# Patient Record
Sex: Female | Born: 1950
Health system: Southern US, Community
[De-identification: ages and names within clinical notes are randomized; demographics above are authoritative.]

## PROBLEM LIST (undated history)

## (undated) DIAGNOSIS — I639 Cerebral infarction, unspecified: Secondary | ICD-10-CM

## (undated) DIAGNOSIS — Z8679 Personal history of other diseases of the circulatory system: Secondary | ICD-10-CM

## (undated) DIAGNOSIS — K5792 Diverticulitis of intestine, part unspecified, without perforation or abscess without bleeding: Secondary | ICD-10-CM

## (undated) DIAGNOSIS — Z923 Personal history of irradiation: Secondary | ICD-10-CM

## (undated) DIAGNOSIS — C50919 Malignant neoplasm of unspecified site of unspecified female breast: Secondary | ICD-10-CM

## (undated) DIAGNOSIS — E785 Hyperlipidemia, unspecified: Secondary | ICD-10-CM

## (undated) DIAGNOSIS — H5440 Blindness, one eye, unspecified eye: Secondary | ICD-10-CM

## (undated) DIAGNOSIS — R7302 Impaired glucose tolerance (oral): Secondary | ICD-10-CM

## (undated) DIAGNOSIS — I10 Essential (primary) hypertension: Secondary | ICD-10-CM

## (undated) DIAGNOSIS — Z9221 Personal history of antineoplastic chemotherapy: Secondary | ICD-10-CM

## (undated) DIAGNOSIS — K219 Gastro-esophageal reflux disease without esophagitis: Secondary | ICD-10-CM

## (undated) HISTORY — DX: Gastro-esophageal reflux disease without esophagitis: K21.9

## (undated) HISTORY — DX: Blindness, one eye, unspecified eye: H54.40

## (undated) HISTORY — DX: Hyperlipidemia, unspecified: E78.5

## (undated) HISTORY — DX: Cerebral infarction, unspecified: I63.9

## (undated) HISTORY — DX: Impaired glucose tolerance (oral): R73.02

## (undated) HISTORY — DX: Essential (primary) hypertension: I10

## (undated) HISTORY — DX: Malignant neoplasm of unspecified site of unspecified female breast: C50.919

## (undated) HISTORY — DX: Personal history of other diseases of the circulatory system: Z86.79

---

## 1993-04-23 HISTORY — PX: ABDOMINAL HYSTERECTOMY: SHX81

## 2003-04-24 DIAGNOSIS — I639 Cerebral infarction, unspecified: Secondary | ICD-10-CM

## 2003-04-24 HISTORY — DX: Cerebral infarction, unspecified: I63.9

## 2004-12-14 ENCOUNTER — Ambulatory Visit: Payer: Self-pay | Admitting: Family Medicine

## 2005-01-25 ENCOUNTER — Ambulatory Visit: Payer: Self-pay | Admitting: *Deleted

## 2005-01-26 ENCOUNTER — Ambulatory Visit: Payer: Self-pay | Admitting: Family Medicine

## 2005-02-08 ENCOUNTER — Ambulatory Visit: Payer: Self-pay | Admitting: Internal Medicine

## 2005-02-21 ENCOUNTER — Encounter: Admission: RE | Admit: 2005-02-21 | Discharge: 2005-02-21 | Payer: Self-pay | Admitting: Family Medicine

## 2005-03-09 ENCOUNTER — Encounter: Admission: RE | Admit: 2005-03-09 | Discharge: 2005-03-09 | Payer: Self-pay | Admitting: Family Medicine

## 2005-03-28 ENCOUNTER — Ambulatory Visit: Payer: Self-pay | Admitting: Family Medicine

## 2005-05-03 ENCOUNTER — Ambulatory Visit: Payer: Self-pay | Admitting: Family Medicine

## 2005-07-26 ENCOUNTER — Emergency Department (HOSPITAL_COMMUNITY): Admission: EM | Admit: 2005-07-26 | Discharge: 2005-07-27 | Payer: Self-pay | Admitting: Emergency Medicine

## 2005-07-29 ENCOUNTER — Emergency Department (HOSPITAL_COMMUNITY): Admission: EM | Admit: 2005-07-29 | Discharge: 2005-07-29 | Payer: Self-pay | Admitting: Emergency Medicine

## 2005-08-02 ENCOUNTER — Ambulatory Visit: Payer: Self-pay | Admitting: Family Medicine

## 2005-08-03 ENCOUNTER — Ambulatory Visit: Payer: Self-pay | Admitting: Family Medicine

## 2005-08-23 ENCOUNTER — Ambulatory Visit: Payer: Self-pay | Admitting: Family Medicine

## 2005-09-28 ENCOUNTER — Ambulatory Visit: Payer: Self-pay | Admitting: Family Medicine

## 2006-02-07 ENCOUNTER — Ambulatory Visit: Payer: Self-pay | Admitting: Family Medicine

## 2006-05-28 ENCOUNTER — Ambulatory Visit: Payer: Self-pay | Admitting: Family Medicine

## 2006-06-05 ENCOUNTER — Encounter: Admission: RE | Admit: 2006-06-05 | Discharge: 2006-06-05 | Payer: Self-pay | Admitting: Family Medicine

## 2006-06-10 ENCOUNTER — Encounter (INDEPENDENT_AMBULATORY_CARE_PROVIDER_SITE_OTHER): Payer: Self-pay | Admitting: *Deleted

## 2006-06-10 ENCOUNTER — Encounter (INDEPENDENT_AMBULATORY_CARE_PROVIDER_SITE_OTHER): Payer: Self-pay | Admitting: Diagnostic Radiology

## 2006-06-10 ENCOUNTER — Encounter: Admission: RE | Admit: 2006-06-10 | Discharge: 2006-06-10 | Payer: Self-pay | Admitting: Family Medicine

## 2006-06-10 HISTORY — PX: BREAST BIOPSY: SHX20

## 2006-06-12 ENCOUNTER — Ambulatory Visit: Payer: Self-pay | Admitting: Internal Medicine

## 2006-06-18 ENCOUNTER — Encounter: Admission: RE | Admit: 2006-06-18 | Discharge: 2006-06-18 | Payer: Self-pay | Admitting: Family Medicine

## 2006-06-20 ENCOUNTER — Ambulatory Visit: Payer: Self-pay | Admitting: Oncology

## 2006-06-26 LAB — CBC WITH DIFFERENTIAL/PLATELET
BASO%: 0.3 % (ref 0.0–2.0)
Basophils Absolute: 0 10*3/uL (ref 0.0–0.1)
EOS%: 3.4 % (ref 0.0–7.0)
Eosinophils Absolute: 0.3 10*3/uL (ref 0.0–0.5)
HCT: 42 % (ref 34.8–46.6)
HGB: 14 g/dL (ref 11.6–15.9)
LYMPH%: 28.5 % (ref 14.0–48.0)
MCH: 29.3 pg (ref 26.0–34.0)
MCHC: 33.3 g/dL (ref 32.0–36.0)
MCV: 88.2 fL (ref 81.0–101.0)
MONO#: 0.7 10*3/uL (ref 0.1–0.9)
MONO%: 9.2 % (ref 0.0–13.0)
NEUT#: 4.7 10*3/uL (ref 1.5–6.5)
NEUT%: 58.6 % (ref 39.6–76.8)
Platelets: 257 10*3/uL (ref 145–400)
RBC: 4.77 10*6/uL (ref 3.70–5.32)
RDW: 15.3 % — ABNORMAL HIGH (ref 11.3–14.5)
WBC: 8 10*3/uL (ref 3.9–10.0)
lymph#: 2.3 10*3/uL (ref 0.9–3.3)

## 2006-06-26 LAB — COMPREHENSIVE METABOLIC PANEL
ALT: 15 U/L (ref 0–35)
AST: 19 U/L (ref 0–37)
Albumin: 4.4 g/dL (ref 3.5–5.2)
Alkaline Phosphatase: 114 U/L (ref 39–117)
BUN: 13 mg/dL (ref 6–23)
CO2: 27 mEq/L (ref 19–32)
Calcium: 9.5 mg/dL (ref 8.4–10.5)
Chloride: 106 mEq/L (ref 96–112)
Creatinine, Ser: 0.86 mg/dL (ref 0.40–1.20)
Glucose, Bld: 90 mg/dL (ref 70–99)
Potassium: 4.3 mEq/L (ref 3.5–5.3)
Sodium: 143 mEq/L (ref 135–145)
Total Bilirubin: 0.4 mg/dL (ref 0.3–1.2)
Total Protein: 7.3 g/dL (ref 6.0–8.3)

## 2006-06-26 LAB — CANCER ANTIGEN 27.29: CA 27.29: 24 U/mL (ref 0–39)

## 2006-06-26 LAB — LACTATE DEHYDROGENASE: LDH: 194 U/L (ref 94–250)

## 2006-06-27 ENCOUNTER — Encounter (INDEPENDENT_AMBULATORY_CARE_PROVIDER_SITE_OTHER): Payer: Self-pay | Admitting: Cardiology

## 2006-06-27 ENCOUNTER — Ambulatory Visit: Admission: RE | Admit: 2006-06-27 | Discharge: 2006-06-27 | Payer: Self-pay | Admitting: Oncology

## 2006-06-28 ENCOUNTER — Ambulatory Visit (HOSPITAL_COMMUNITY): Admission: RE | Admit: 2006-06-28 | Discharge: 2006-06-28 | Payer: Self-pay | Admitting: Oncology

## 2006-07-02 ENCOUNTER — Ambulatory Visit (HOSPITAL_COMMUNITY): Admission: RE | Admit: 2006-07-02 | Discharge: 2006-07-02 | Payer: Self-pay | Admitting: Oncology

## 2006-07-05 ENCOUNTER — Ambulatory Visit (HOSPITAL_COMMUNITY): Admission: RE | Admit: 2006-07-05 | Discharge: 2006-07-05 | Payer: Self-pay | Admitting: Surgery

## 2006-07-09 ENCOUNTER — Encounter: Admission: RE | Admit: 2006-07-09 | Discharge: 2006-07-09 | Payer: Self-pay | Admitting: Oncology

## 2006-07-09 ENCOUNTER — Encounter (INDEPENDENT_AMBULATORY_CARE_PROVIDER_SITE_OTHER): Payer: Self-pay | Admitting: Specialist

## 2006-07-17 LAB — CBC WITH DIFFERENTIAL/PLATELET
BASO%: 0.5 % (ref 0.0–2.0)
Basophils Absolute: 0 10*3/uL (ref 0.0–0.1)
EOS%: 3.8 % (ref 0.0–7.0)
Eosinophils Absolute: 0.1 10*3/uL (ref 0.0–0.5)
HCT: 38.6 % (ref 34.8–46.6)
HGB: 13.2 g/dL (ref 11.6–15.9)
LYMPH%: 52.1 % — ABNORMAL HIGH (ref 14.0–48.0)
MCH: 29.6 pg (ref 26.0–34.0)
MCHC: 34.1 g/dL (ref 32.0–36.0)
MCV: 86.8 fL (ref 81.0–101.0)
MONO#: 0.1 10*3/uL (ref 0.1–0.9)
MONO%: 2.1 % (ref 0.0–13.0)
NEUT#: 1.1 10*3/uL — ABNORMAL LOW (ref 1.5–6.5)
NEUT%: 41.5 % (ref 39.6–76.8)
Platelets: 144 10*3/uL — ABNORMAL LOW (ref 145–400)
RBC: 4.44 10*6/uL (ref 3.70–5.32)
RDW: 15.3 % — ABNORMAL HIGH (ref 11.3–14.5)
WBC: 2.7 10*3/uL — ABNORMAL LOW (ref 3.9–10.0)
lymph#: 1.4 10*3/uL (ref 0.9–3.3)

## 2006-07-24 LAB — CBC WITH DIFFERENTIAL/PLATELET
BASO%: 0.9 % (ref 0.0–2.0)
Basophils Absolute: 0.1 10*3/uL (ref 0.0–0.1)
EOS%: 0.4 % (ref 0.0–7.0)
Eosinophils Absolute: 0.1 10*3/uL (ref 0.0–0.5)
HCT: 36.8 % (ref 34.8–46.6)
HGB: 12.3 g/dL (ref 11.6–15.9)
LYMPH%: 19.5 % (ref 14.0–48.0)
MCH: 29.6 pg (ref 26.0–34.0)
MCHC: 33.4 g/dL (ref 32.0–36.0)
MCV: 88.6 fL (ref 81.0–101.0)
MONO#: 1.1 10*3/uL — ABNORMAL HIGH (ref 0.1–0.9)
MONO%: 8 % (ref 0.0–13.0)
NEUT#: 9.7 10*3/uL — ABNORMAL HIGH (ref 1.5–6.5)
NEUT%: 71.1 % (ref 39.6–76.8)
Platelets: 203 10*3/uL (ref 145–400)
RBC: 4.16 10*6/uL (ref 3.70–5.32)
RDW: 13.5 % (ref 11.3–14.5)
WBC: 13.6 10*3/uL — ABNORMAL HIGH (ref 3.9–10.0)
lymph#: 2.7 10*3/uL (ref 0.9–3.3)

## 2006-07-24 LAB — COMPREHENSIVE METABOLIC PANEL
ALT: 16 U/L (ref 0–35)
AST: 17 U/L (ref 0–37)
Albumin: 4.2 g/dL (ref 3.5–5.2)
Alkaline Phosphatase: 132 U/L — ABNORMAL HIGH (ref 39–117)
BUN: 10 mg/dL (ref 6–23)
CO2: 26 mEq/L (ref 19–32)
Calcium: 9.2 mg/dL (ref 8.4–10.5)
Chloride: 107 mEq/L (ref 96–112)
Creatinine, Ser: 0.82 mg/dL (ref 0.40–1.20)
Glucose, Bld: 85 mg/dL (ref 70–99)
Potassium: 4.1 mEq/L (ref 3.5–5.3)
Sodium: 142 mEq/L (ref 135–145)
Total Bilirubin: 0.2 mg/dL — ABNORMAL LOW (ref 0.3–1.2)
Total Protein: 6.9 g/dL (ref 6.0–8.3)

## 2006-07-29 ENCOUNTER — Ambulatory Visit: Payer: Self-pay | Admitting: Oncology

## 2006-07-31 LAB — CBC WITH DIFFERENTIAL/PLATELET
BASO%: 1.2 % (ref 0.0–2.0)
Basophils Absolute: 0.1 10*3/uL (ref 0.0–0.1)
EOS%: 0.4 % (ref 0.0–7.0)
Eosinophils Absolute: 0 10*3/uL (ref 0.0–0.5)
HCT: 36.6 % (ref 34.8–46.6)
HGB: 12.2 g/dL (ref 11.6–15.9)
LYMPH%: 32.6 % (ref 14.0–48.0)
MCH: 29.4 pg (ref 26.0–34.0)
MCHC: 33.3 g/dL (ref 32.0–36.0)
MCV: 88.2 fL (ref 81.0–101.0)
MONO#: 0.4 10*3/uL (ref 0.1–0.9)
MONO%: 7.4 % (ref 0.0–13.0)
NEUT#: 3 10*3/uL (ref 1.5–6.5)
NEUT%: 58.5 % (ref 39.6–76.8)
Platelets: 125 10*3/uL — ABNORMAL LOW (ref 145–400)
RBC: 4.15 10*6/uL (ref 3.70–5.32)
RDW: 13.5 % (ref 11.3–14.5)
WBC: 5 10*3/uL (ref 3.9–10.0)
lymph#: 1.7 10*3/uL (ref 0.9–3.3)

## 2006-08-01 ENCOUNTER — Ambulatory Visit: Payer: Self-pay | Admitting: Family Medicine

## 2006-08-07 LAB — CBC WITH DIFFERENTIAL/PLATELET
BASO%: 0.7 % (ref 0.0–2.0)
Basophils Absolute: 0.1 10*3/uL (ref 0.0–0.1)
EOS%: 0.1 % (ref 0.0–7.0)
Eosinophils Absolute: 0 10*3/uL (ref 0.0–0.5)
HCT: 37.2 % (ref 34.8–46.6)
HGB: 12.2 g/dL (ref 11.6–15.9)
LYMPH%: 12.1 % — ABNORMAL LOW (ref 14.0–48.0)
MCH: 28.9 pg (ref 26.0–34.0)
MCHC: 32.8 g/dL (ref 32.0–36.0)
MCV: 88.2 fL (ref 81.0–101.0)
MONO#: 1 10*3/uL — ABNORMAL HIGH (ref 0.1–0.9)
MONO%: 5.6 % (ref 0.0–13.0)
NEUT#: 14.8 10*3/uL — ABNORMAL HIGH (ref 1.5–6.5)
NEUT%: 81.5 % — ABNORMAL HIGH (ref 39.6–76.8)
Platelets: 258 10*3/uL (ref 145–400)
RBC: 4.22 10*6/uL (ref 3.70–5.32)
RDW: 14.2 % (ref 11.3–14.5)
WBC: 18.2 10*3/uL — ABNORMAL HIGH (ref 3.9–10.0)
lymph#: 2.2 10*3/uL (ref 0.9–3.3)

## 2006-08-07 LAB — COMPREHENSIVE METABOLIC PANEL
ALT: 14 U/L (ref 0–35)
AST: 14 U/L (ref 0–37)
Albumin: 3.9 g/dL (ref 3.5–5.2)
Alkaline Phosphatase: 137 U/L — ABNORMAL HIGH (ref 39–117)
BUN: 12 mg/dL (ref 6–23)
CO2: 23 mEq/L (ref 19–32)
Calcium: 8.9 mg/dL (ref 8.4–10.5)
Chloride: 106 mEq/L (ref 96–112)
Creatinine, Ser: 0.65 mg/dL (ref 0.40–1.20)
Glucose, Bld: 105 mg/dL — ABNORMAL HIGH (ref 70–99)
Potassium: 3.8 mEq/L (ref 3.5–5.3)
Sodium: 141 mEq/L (ref 135–145)
Total Bilirubin: 0.3 mg/dL (ref 0.3–1.2)
Total Protein: 6.4 g/dL (ref 6.0–8.3)

## 2006-08-12 ENCOUNTER — Ambulatory Visit: Payer: Self-pay | Admitting: Family Medicine

## 2006-08-14 LAB — CBC WITH DIFFERENTIAL/PLATELET
BASO%: 0.9 % (ref 0.0–2.0)
Basophils Absolute: 0 10*3/uL (ref 0.0–0.1)
EOS%: 0.6 % (ref 0.0–7.0)
Eosinophils Absolute: 0 10*3/uL (ref 0.0–0.5)
HCT: 35.6 % (ref 34.8–46.6)
HGB: 12 g/dL (ref 11.6–15.9)
LYMPH%: 27.3 % (ref 14.0–48.0)
MCH: 29.9 pg (ref 26.0–34.0)
MCHC: 33.7 g/dL (ref 32.0–36.0)
MCV: 88.8 fL (ref 81.0–101.0)
MONO#: 0.1 10*3/uL (ref 0.1–0.9)
MONO%: 2.9 % (ref 0.0–13.0)
NEUT#: 2.5 10*3/uL (ref 1.5–6.5)
NEUT%: 68.2 % (ref 39.6–76.8)
Platelets: 112 10*3/uL — ABNORMAL LOW (ref 145–400)
RBC: 4.01 10*6/uL (ref 3.70–5.32)
RDW: 15 % — ABNORMAL HIGH (ref 11.3–14.5)
WBC: 3.7 10*3/uL — ABNORMAL LOW (ref 3.9–10.0)
lymph#: 1 10*3/uL (ref 0.9–3.3)

## 2006-08-21 LAB — CBC WITH DIFFERENTIAL/PLATELET
BASO%: 0.7 % (ref 0.0–2.0)
Basophils Absolute: 0.1 10*3/uL (ref 0.0–0.1)
EOS%: 0.4 % (ref 0.0–7.0)
Eosinophils Absolute: 0 10*3/uL (ref 0.0–0.5)
HCT: 36.8 % (ref 34.8–46.6)
HGB: 12.1 g/dL (ref 11.6–15.9)
LYMPH%: 14.3 % (ref 14.0–48.0)
MCH: 29.6 pg (ref 26.0–34.0)
MCHC: 32.9 g/dL (ref 32.0–36.0)
MCV: 89.9 fL (ref 81.0–101.0)
MONO#: 0.6 10*3/uL (ref 0.1–0.9)
MONO%: 5.1 % (ref 0.0–13.0)
NEUT#: 10 10*3/uL — ABNORMAL HIGH (ref 1.5–6.5)
NEUT%: 79.5 % — ABNORMAL HIGH (ref 39.6–76.8)
Platelets: 207 10*3/uL (ref 145–400)
RBC: 4.09 10*6/uL (ref 3.70–5.32)
RDW: 16.9 % — ABNORMAL HIGH (ref 11.3–14.5)
WBC: 12.5 10*3/uL — ABNORMAL HIGH (ref 3.9–10.0)
lymph#: 1.8 10*3/uL (ref 0.9–3.3)

## 2006-08-24 DIAGNOSIS — I152 Hypertension secondary to endocrine disorders: Secondary | ICD-10-CM

## 2006-08-24 DIAGNOSIS — IMO0002 Reserved for concepts with insufficient information to code with codable children: Secondary | ICD-10-CM

## 2006-08-24 DIAGNOSIS — E1159 Type 2 diabetes mellitus with other circulatory complications: Secondary | ICD-10-CM | POA: Insufficient documentation

## 2006-08-24 DIAGNOSIS — E1169 Type 2 diabetes mellitus with other specified complication: Secondary | ICD-10-CM

## 2006-08-24 DIAGNOSIS — K219 Gastro-esophageal reflux disease without esophagitis: Secondary | ICD-10-CM | POA: Insufficient documentation

## 2006-08-24 DIAGNOSIS — I10 Essential (primary) hypertension: Secondary | ICD-10-CM

## 2006-08-24 DIAGNOSIS — Z853 Personal history of malignant neoplasm of breast: Secondary | ICD-10-CM | POA: Insufficient documentation

## 2006-08-24 DIAGNOSIS — E785 Hyperlipidemia, unspecified: Secondary | ICD-10-CM

## 2006-08-24 DIAGNOSIS — Z8679 Personal history of other diseases of the circulatory system: Secondary | ICD-10-CM

## 2006-08-24 HISTORY — DX: Type 2 diabetes mellitus with other specified complication: E11.69

## 2006-08-24 HISTORY — DX: Hypertension secondary to endocrine disorders: I15.2

## 2006-08-24 HISTORY — DX: Hyperlipidemia, unspecified: E78.5

## 2006-08-24 HISTORY — DX: Gastro-esophageal reflux disease without esophagitis: K21.9

## 2006-08-24 HISTORY — DX: Essential (primary) hypertension: I10

## 2006-08-24 HISTORY — DX: Type 2 diabetes mellitus with other circulatory complications: E11.59

## 2006-08-24 HISTORY — DX: Personal history of other diseases of the circulatory system: Z86.79

## 2006-08-24 HISTORY — DX: Reserved for concepts with insufficient information to code with codable children: IMO0002

## 2006-08-26 ENCOUNTER — Encounter: Admission: RE | Admit: 2006-08-26 | Discharge: 2006-08-26 | Payer: Self-pay | Admitting: Oncology

## 2006-08-28 LAB — COMPREHENSIVE METABOLIC PANEL
ALT: 10 U/L (ref 0–35)
AST: 12 U/L (ref 0–37)
Albumin: 4 g/dL (ref 3.5–5.2)
Alkaline Phosphatase: 157 U/L — ABNORMAL HIGH (ref 39–117)
BUN: 13 mg/dL (ref 6–23)
CO2: 24 mEq/L (ref 19–32)
Calcium: 9 mg/dL (ref 8.4–10.5)
Chloride: 106 mEq/L (ref 96–112)
Creatinine, Ser: 0.88 mg/dL (ref 0.40–1.20)
Glucose, Bld: 142 mg/dL — ABNORMAL HIGH (ref 70–99)
Potassium: 3.9 mEq/L (ref 3.5–5.3)
Sodium: 143 mEq/L (ref 135–145)
Total Bilirubin: 0.3 mg/dL (ref 0.3–1.2)
Total Protein: 6.4 g/dL (ref 6.0–8.3)

## 2006-08-28 LAB — CBC WITH DIFFERENTIAL/PLATELET
BASO%: 0.3 % (ref 0.0–2.0)
Basophils Absolute: 0 10*3/uL (ref 0.0–0.1)
EOS%: 0.8 % (ref 0.0–7.0)
Eosinophils Absolute: 0 10*3/uL (ref 0.0–0.5)
HCT: 33.2 % — ABNORMAL LOW (ref 34.8–46.6)
HGB: 11.2 g/dL — ABNORMAL LOW (ref 11.6–15.9)
LYMPH%: 17.8 % (ref 14.0–48.0)
MCH: 29.9 pg (ref 26.0–34.0)
MCHC: 33.7 g/dL (ref 32.0–36.0)
MCV: 88.6 fL (ref 81.0–101.0)
MONO#: 0.1 10*3/uL (ref 0.1–0.9)
MONO%: 1.4 % (ref 0.0–13.0)
NEUT#: 3.1 10*3/uL (ref 1.5–6.5)
NEUT%: 79.7 % — ABNORMAL HIGH (ref 39.6–76.8)
Platelets: 84 10*3/uL — ABNORMAL LOW (ref 145–400)
RBC: 3.75 10*6/uL (ref 3.70–5.32)
RDW: 18 % — ABNORMAL HIGH (ref 11.3–14.5)
WBC: 3.9 10*3/uL (ref 3.9–10.0)
lymph#: 0.7 10*3/uL — ABNORMAL LOW (ref 0.9–3.3)

## 2006-09-04 LAB — COMPREHENSIVE METABOLIC PANEL
ALT: 13 U/L (ref 0–35)
AST: 14 U/L (ref 0–37)
Albumin: 4.2 g/dL (ref 3.5–5.2)
Alkaline Phosphatase: 144 U/L — ABNORMAL HIGH (ref 39–117)
BUN: 16 mg/dL (ref 6–23)
CO2: 22 mEq/L (ref 19–32)
Calcium: 9.5 mg/dL (ref 8.4–10.5)
Chloride: 106 mEq/L (ref 96–112)
Creatinine, Ser: 0.85 mg/dL (ref 0.40–1.20)
Glucose, Bld: 203 mg/dL — ABNORMAL HIGH (ref 70–99)
Potassium: 3.8 mEq/L (ref 3.5–5.3)
Sodium: 139 mEq/L (ref 135–145)
Total Bilirubin: 0.3 mg/dL (ref 0.3–1.2)
Total Protein: 6.8 g/dL (ref 6.0–8.3)

## 2006-09-04 LAB — CBC WITH DIFFERENTIAL/PLATELET
BASO%: 0.2 % (ref 0.0–2.0)
Basophils Absolute: 0.1 10*3/uL (ref 0.0–0.1)
EOS%: 0 % (ref 0.0–7.0)
Eosinophils Absolute: 0 10*3/uL (ref 0.0–0.5)
HCT: 33.7 % — ABNORMAL LOW (ref 34.8–46.6)
HGB: 11.2 g/dL — ABNORMAL LOW (ref 11.6–15.9)
LYMPH%: 2.6 % — ABNORMAL LOW (ref 14.0–48.0)
MCH: 30 pg (ref 26.0–34.0)
MCHC: 33.1 g/dL (ref 32.0–36.0)
MCV: 90.7 fL (ref 81.0–101.0)
MONO#: 0.5 10*3/uL (ref 0.1–0.9)
MONO%: 1.7 % (ref 0.0–13.0)
NEUT#: 30.7 10*3/uL — ABNORMAL HIGH (ref 1.5–6.5)
NEUT%: 95.5 % — ABNORMAL HIGH (ref 39.6–76.8)
Platelets: 232 10*3/uL (ref 145–400)
RBC: 3.72 10*6/uL (ref 3.70–5.32)
RDW: 18 % — ABNORMAL HIGH (ref 11.3–14.5)
WBC: 32.2 10*3/uL — ABNORMAL HIGH (ref 3.9–10.0)
lymph#: 0.8 10*3/uL — ABNORMAL LOW (ref 0.9–3.3)

## 2006-09-09 ENCOUNTER — Ambulatory Visit: Payer: Self-pay | Admitting: Oncology

## 2006-09-11 LAB — CBC WITH DIFFERENTIAL/PLATELET
BASO%: 3 % — ABNORMAL HIGH (ref 0.0–2.0)
Basophils Absolute: 0 10*3/uL (ref 0.0–0.1)
EOS%: 0.6 % (ref 0.0–7.0)
Eosinophils Absolute: 0 10*3/uL (ref 0.0–0.5)
HCT: 34.7 % — ABNORMAL LOW (ref 34.8–46.6)
HGB: 11.4 g/dL — ABNORMAL LOW (ref 11.6–15.9)
LYMPH%: 41.2 % (ref 14.0–48.0)
MCH: 29.8 pg (ref 26.0–34.0)
MCHC: 33 g/dL (ref 32.0–36.0)
MCV: 90.4 fL (ref 81.0–101.0)
MONO#: 0.2 10*3/uL (ref 0.1–0.9)
MONO%: 15.1 % — ABNORMAL HIGH (ref 0.0–13.0)
NEUT#: 0.7 10*3/uL — ABNORMAL LOW (ref 1.5–6.5)
NEUT%: 40.1 % (ref 39.6–76.8)
Platelets: 306 10*3/uL (ref 145–400)
RBC: 3.84 10*6/uL (ref 3.70–5.32)
RDW: 17.2 % — ABNORMAL HIGH (ref 11.3–14.5)
WBC: 1.6 10*3/uL — ABNORMAL LOW (ref 3.9–10.0)
lymph#: 0.7 10*3/uL — ABNORMAL LOW (ref 0.9–3.3)

## 2006-09-17 LAB — CBC WITH DIFFERENTIAL/PLATELET
BASO%: 1.5 % (ref 0.0–2.0)
Basophils Absolute: 0.1 10*3/uL (ref 0.0–0.1)
EOS%: 0.7 % (ref 0.0–7.0)
Eosinophils Absolute: 0 10*3/uL (ref 0.0–0.5)
HCT: 33.3 % — ABNORMAL LOW (ref 34.8–46.6)
HGB: 11.1 g/dL — ABNORMAL LOW (ref 11.6–15.9)
LYMPH%: 28.2 % (ref 14.0–48.0)
MCH: 29.3 pg (ref 26.0–34.0)
MCHC: 33.2 g/dL (ref 32.0–36.0)
MCV: 88.4 fL (ref 81.0–101.0)
MONO#: 1.6 10*3/uL — ABNORMAL HIGH (ref 0.1–0.9)
MONO%: 37.9 % — ABNORMAL HIGH (ref 0.0–13.0)
NEUT#: 1.3 10*3/uL — ABNORMAL LOW (ref 1.5–6.5)
NEUT%: 31.6 % — ABNORMAL LOW (ref 39.6–76.8)
Platelets: 296 10*3/uL (ref 145–400)
RBC: 3.77 10*6/uL (ref 3.70–5.32)
RDW: 16.7 % — ABNORMAL HIGH (ref 11.3–14.5)
WBC: 4.2 10*3/uL (ref 3.9–10.0)
lymph#: 1.2 10*3/uL (ref 0.9–3.3)

## 2006-09-17 LAB — COMPREHENSIVE METABOLIC PANEL
ALT: 21 U/L (ref 0–35)
AST: 17 U/L (ref 0–37)
Albumin: 4.3 g/dL (ref 3.5–5.2)
Alkaline Phosphatase: 49 U/L (ref 39–117)
BUN: 9 mg/dL (ref 6–23)
CO2: 21 mEq/L (ref 19–32)
Calcium: 9.3 mg/dL (ref 8.4–10.5)
Chloride: 104 mEq/L (ref 96–112)
Creatinine, Ser: 0.76 mg/dL (ref 0.40–1.20)
Glucose, Bld: 151 mg/dL — ABNORMAL HIGH (ref 70–99)
Potassium: 3.6 mEq/L (ref 3.5–5.3)
Sodium: 140 mEq/L (ref 135–145)
Total Bilirubin: 0.6 mg/dL (ref 0.3–1.2)
Total Protein: 6.7 g/dL (ref 6.0–8.3)

## 2006-09-25 LAB — CBC WITH DIFFERENTIAL/PLATELET
BASO%: 0.8 % (ref 0.0–2.0)
Basophils Absolute: 0.2 10*3/uL — ABNORMAL HIGH (ref 0.0–0.1)
EOS%: 0.3 % (ref 0.0–7.0)
Eosinophils Absolute: 0.1 10*3/uL (ref 0.0–0.5)
HCT: 35.3 % (ref 34.8–46.6)
HGB: 12.2 g/dL (ref 11.6–15.9)
LYMPH%: 7 % — ABNORMAL LOW (ref 14.0–48.0)
MCH: 30.3 pg (ref 26.0–34.0)
MCHC: 34.4 g/dL (ref 32.0–36.0)
MCV: 88.2 fL (ref 81.0–101.0)
MONO#: 2.4 10*3/uL — ABNORMAL HIGH (ref 0.1–0.9)
MONO%: 9.4 % (ref 0.0–13.0)
NEUT#: 21.1 10*3/uL — ABNORMAL HIGH (ref 1.5–6.5)
NEUT%: 82.6 % — ABNORMAL HIGH (ref 39.6–76.8)
Platelets: 200 10*3/uL (ref 145–400)
RBC: 4.01 10*6/uL (ref 3.70–5.32)
RDW: 17.3 % — ABNORMAL HIGH (ref 11.3–14.5)
WBC: 25.6 10*3/uL — ABNORMAL HIGH (ref 3.9–10.0)
lymph#: 1.8 10*3/uL (ref 0.9–3.3)

## 2006-10-02 LAB — CBC WITH DIFFERENTIAL/PLATELET
BASO%: 0.1 % (ref 0.0–2.0)
Basophils Absolute: 0 10*3/uL (ref 0.0–0.1)
EOS%: 0 % (ref 0.0–7.0)
Eosinophils Absolute: 0 10*3/uL (ref 0.0–0.5)
HCT: 35.4 % (ref 34.8–46.6)
HGB: 11.4 g/dL — ABNORMAL LOW (ref 11.6–15.9)
LYMPH%: 2.5 % — ABNORMAL LOW (ref 14.0–48.0)
MCH: 28.7 pg (ref 26.0–34.0)
MCHC: 32.3 g/dL (ref 32.0–36.0)
MCV: 88.8 fL (ref 81.0–101.0)
MONO#: 0.2 10*3/uL (ref 0.1–0.9)
MONO%: 0.6 % (ref 0.0–13.0)
NEUT#: 29.5 10*3/uL — ABNORMAL HIGH (ref 1.5–6.5)
NEUT%: 96.8 % — ABNORMAL HIGH (ref 39.6–76.8)
Platelets: 171 10*3/uL (ref 145–400)
RBC: 3.98 10*6/uL (ref 3.70–5.32)
RDW: 18.1 % — ABNORMAL HIGH (ref 11.3–14.5)
WBC: 30.5 10*3/uL — ABNORMAL HIGH (ref 3.9–10.0)
lymph#: 0.8 10*3/uL — ABNORMAL LOW (ref 0.9–3.3)

## 2006-10-02 LAB — COMPREHENSIVE METABOLIC PANEL
ALT: 19 U/L (ref 0–35)
AST: 19 U/L (ref 0–37)
Albumin: 4.2 g/dL (ref 3.5–5.2)
Alkaline Phosphatase: 113 U/L (ref 39–117)
BUN: 14 mg/dL (ref 6–23)
CO2: 22 mEq/L (ref 19–32)
Calcium: 9.3 mg/dL (ref 8.4–10.5)
Chloride: 108 mEq/L (ref 96–112)
Creatinine, Ser: 0.79 mg/dL (ref 0.40–1.20)
Glucose, Bld: 128 mg/dL — ABNORMAL HIGH (ref 70–99)
Potassium: 4.2 mEq/L (ref 3.5–5.3)
Sodium: 140 mEq/L (ref 135–145)
Total Bilirubin: 0.3 mg/dL (ref 0.3–1.2)
Total Protein: 6.8 g/dL (ref 6.0–8.3)

## 2006-10-09 LAB — CBC WITH DIFFERENTIAL/PLATELET
BASO%: 1.3 % (ref 0.0–2.0)
Basophils Absolute: 0 10*3/uL (ref 0.0–0.1)
EOS%: 1.4 % (ref 0.0–7.0)
Eosinophils Absolute: 0 10*3/uL (ref 0.0–0.5)
HCT: 34.3 % — ABNORMAL LOW (ref 34.8–46.6)
HGB: 11.5 g/dL — ABNORMAL LOW (ref 11.6–15.9)
LYMPH%: 63.4 % — ABNORMAL HIGH (ref 14.0–48.0)
MCH: 29.4 pg (ref 26.0–34.0)
MCHC: 33.5 g/dL (ref 32.0–36.0)
MCV: 87.7 fL (ref 81.0–101.0)
MONO#: 0.1 10*3/uL (ref 0.1–0.9)
MONO%: 4.4 % (ref 0.0–13.0)
NEUT#: 0.4 10*3/uL — CL (ref 1.5–6.5)
NEUT%: 29.5 % — ABNORMAL LOW (ref 39.6–76.8)
Platelets: 170 10*3/uL (ref 145–400)
RBC: 3.91 10*6/uL (ref 3.70–5.32)
RDW: 21.4 % — ABNORMAL HIGH (ref 11.3–14.5)
WBC: 1.3 10*3/uL — ABNORMAL LOW (ref 3.9–10.0)
lymph#: 0.8 10*3/uL — ABNORMAL LOW (ref 0.9–3.3)

## 2006-10-09 LAB — COMPREHENSIVE METABOLIC PANEL
ALT: 30 U/L (ref 0–35)
AST: 25 U/L (ref 0–37)
Albumin: 4.2 g/dL (ref 3.5–5.2)
Alkaline Phosphatase: 80 U/L (ref 39–117)
BUN: 11 mg/dL (ref 6–23)
CO2: 24 mEq/L (ref 19–32)
Calcium: 9.3 mg/dL (ref 8.4–10.5)
Chloride: 106 mEq/L (ref 96–112)
Creatinine, Ser: 0.75 mg/dL (ref 0.40–1.20)
Glucose, Bld: 111 mg/dL — ABNORMAL HIGH (ref 70–99)
Potassium: 3.7 mEq/L (ref 3.5–5.3)
Sodium: 140 mEq/L (ref 135–145)
Total Bilirubin: 0.6 mg/dL (ref 0.3–1.2)
Total Protein: 6.6 g/dL (ref 6.0–8.3)

## 2006-10-16 LAB — CBC WITH DIFFERENTIAL/PLATELET
BASO%: 0.2 % (ref 0.0–2.0)
Basophils Absolute: 0 10*3/uL (ref 0.0–0.1)
EOS%: 0.4 % (ref 0.0–7.0)
Eosinophils Absolute: 0 10*3/uL (ref 0.0–0.5)
HCT: 32.8 % — ABNORMAL LOW (ref 34.8–46.6)
HGB: 10.9 g/dL — ABNORMAL LOW (ref 11.6–15.9)
LYMPH%: 23.4 % (ref 14.0–48.0)
MCH: 28.9 pg (ref 26.0–34.0)
MCHC: 33.3 g/dL (ref 32.0–36.0)
MCV: 86.9 fL (ref 81.0–101.0)
MONO#: 0.1 10*3/uL (ref 0.1–0.9)
MONO%: 6.7 % (ref 0.0–13.0)
NEUT#: 1.3 10*3/uL — ABNORMAL LOW (ref 1.5–6.5)
NEUT%: 69.2 % (ref 39.6–76.8)
Platelets: 205 10*3/uL (ref 145–400)
RBC: 3.77 10*6/uL (ref 3.70–5.32)
RDW: 17.5 % — ABNORMAL HIGH (ref 11.3–14.5)
WBC: 1.8 10*3/uL — ABNORMAL LOW (ref 3.9–10.0)
lymph#: 0.4 10*3/uL — ABNORMAL LOW (ref 0.9–3.3)

## 2006-10-21 ENCOUNTER — Encounter: Admission: RE | Admit: 2006-10-21 | Discharge: 2006-10-21 | Payer: Self-pay | Admitting: Oncology

## 2006-10-28 ENCOUNTER — Ambulatory Visit: Payer: Self-pay | Admitting: Oncology

## 2006-10-30 LAB — CBC WITH DIFFERENTIAL/PLATELET
BASO%: 0.8 % (ref 0.0–2.0)
Basophils Absolute: 0 10*3/uL (ref 0.0–0.1)
EOS%: 3.3 % (ref 0.0–7.0)
Eosinophils Absolute: 0.1 10*3/uL (ref 0.0–0.5)
HCT: 34.2 % — ABNORMAL LOW (ref 34.8–46.6)
HGB: 11.2 g/dL — ABNORMAL LOW (ref 11.6–15.9)
LYMPH%: 24.7 % (ref 14.0–48.0)
MCH: 29.3 pg (ref 26.0–34.0)
MCHC: 32.8 g/dL (ref 32.0–36.0)
MCV: 89.3 fL (ref 81.0–101.0)
MONO#: 0.7 10*3/uL (ref 0.1–0.9)
MONO%: 16.1 % — ABNORMAL HIGH (ref 0.0–13.0)
NEUT#: 2.3 10*3/uL (ref 1.5–6.5)
NEUT%: 55.1 % (ref 39.6–76.8)
Platelets: 194 10*3/uL (ref 145–400)
RBC: 3.83 10*6/uL (ref 3.70–5.32)
RDW: 18.2 % — ABNORMAL HIGH (ref 11.3–14.5)
WBC: 4.2 10*3/uL (ref 3.9–10.0)
lymph#: 1 10*3/uL (ref 0.9–3.3)

## 2006-11-25 ENCOUNTER — Ambulatory Visit (HOSPITAL_BASED_OUTPATIENT_CLINIC_OR_DEPARTMENT_OTHER): Admission: RE | Admit: 2006-11-25 | Discharge: 2006-11-25 | Payer: Self-pay | Admitting: Surgery

## 2006-11-25 ENCOUNTER — Encounter: Admission: RE | Admit: 2006-11-25 | Discharge: 2006-11-25 | Payer: Self-pay | Admitting: Surgery

## 2006-11-25 ENCOUNTER — Ambulatory Visit (HOSPITAL_COMMUNITY): Admission: RE | Admit: 2006-11-25 | Discharge: 2006-11-25 | Payer: Self-pay | Admitting: Surgery

## 2006-11-25 ENCOUNTER — Encounter (INDEPENDENT_AMBULATORY_CARE_PROVIDER_SITE_OTHER): Payer: Self-pay | Admitting: Surgery

## 2006-11-25 HISTORY — PX: BREAST LUMPECTOMY: SHX2

## 2006-12-02 ENCOUNTER — Encounter (INDEPENDENT_AMBULATORY_CARE_PROVIDER_SITE_OTHER): Payer: Self-pay | Admitting: Family Medicine

## 2006-12-02 LAB — CBC WITH DIFFERENTIAL/PLATELET
BASO%: 0.6 % (ref 0.0–2.0)
Basophils Absolute: 0 10*3/uL (ref 0.0–0.1)
EOS%: 2.7 % (ref 0.0–7.0)
Eosinophils Absolute: 0.1 10*3/uL (ref 0.0–0.5)
HCT: 34.5 % — ABNORMAL LOW (ref 34.8–46.6)
HGB: 11.3 g/dL — ABNORMAL LOW (ref 11.6–15.9)
LYMPH%: 27.8 % (ref 14.0–48.0)
MCH: 28.7 pg (ref 26.0–34.0)
MCHC: 32.6 g/dL (ref 32.0–36.0)
MCV: 88.1 fL (ref 81.0–101.0)
MONO#: 0.5 10*3/uL (ref 0.1–0.9)
MONO%: 9.8 % (ref 0.0–13.0)
NEUT#: 3.1 10*3/uL (ref 1.5–6.5)
NEUT%: 59.1 % (ref 39.6–76.8)
Platelets: 271 10*3/uL (ref 145–400)
RBC: 3.92 10*6/uL (ref 3.70–5.32)
RDW: 21.2 % — ABNORMAL HIGH (ref 11.3–14.5)
WBC: 5.3 10*3/uL (ref 3.9–10.0)
lymph#: 1.5 10*3/uL (ref 0.9–3.3)

## 2006-12-02 LAB — COMPREHENSIVE METABOLIC PANEL
ALT: 12 U/L (ref 0–35)
AST: 16 U/L (ref 0–37)
Albumin: 4 g/dL (ref 3.5–5.2)
Alkaline Phosphatase: 83 U/L (ref 39–117)
BUN: 11 mg/dL (ref 6–23)
CO2: 24 mEq/L (ref 19–32)
Calcium: 9.7 mg/dL (ref 8.4–10.5)
Chloride: 107 mEq/L (ref 96–112)
Creatinine, Ser: 0.75 mg/dL (ref 0.40–1.20)
Glucose, Bld: 94 mg/dL (ref 70–99)
Potassium: 3.3 mEq/L — ABNORMAL LOW (ref 3.5–5.3)
Sodium: 143 mEq/L (ref 135–145)
Total Bilirubin: 0.4 mg/dL (ref 0.3–1.2)
Total Protein: 6.4 g/dL (ref 6.0–8.3)

## 2006-12-12 ENCOUNTER — Ambulatory Visit: Admission: RE | Admit: 2006-12-12 | Discharge: 2007-03-06 | Payer: Self-pay | Admitting: Radiation Oncology

## 2006-12-13 ENCOUNTER — Encounter (INDEPENDENT_AMBULATORY_CARE_PROVIDER_SITE_OTHER): Payer: Self-pay | Admitting: Family Medicine

## 2006-12-26 DIAGNOSIS — Z923 Personal history of irradiation: Secondary | ICD-10-CM

## 2006-12-26 HISTORY — DX: Personal history of irradiation: Z92.3

## 2007-01-09 ENCOUNTER — Encounter (INDEPENDENT_AMBULATORY_CARE_PROVIDER_SITE_OTHER): Payer: Self-pay | Admitting: Family Medicine

## 2007-01-24 ENCOUNTER — Ambulatory Visit: Payer: Self-pay | Admitting: Oncology

## 2007-01-24 LAB — COMPREHENSIVE METABOLIC PANEL
ALT: 11 U/L (ref 0–35)
AST: 16 U/L (ref 0–37)
Albumin: 4.2 g/dL (ref 3.5–5.2)
Alkaline Phosphatase: 108 U/L (ref 39–117)
BUN: 13 mg/dL (ref 6–23)
CO2: 25 mEq/L (ref 19–32)
Calcium: 9.3 mg/dL (ref 8.4–10.5)
Chloride: 105 mEq/L (ref 96–112)
Creatinine, Ser: 0.84 mg/dL (ref 0.40–1.20)
Glucose, Bld: 88 mg/dL (ref 70–99)
Potassium: 3.5 mEq/L (ref 3.5–5.3)
Sodium: 141 mEq/L (ref 135–145)
Total Bilirubin: 0.2 mg/dL — ABNORMAL LOW (ref 0.3–1.2)
Total Protein: 6.8 g/dL (ref 6.0–8.3)

## 2007-01-24 LAB — CANCER ANTIGEN 27.29: CA 27.29: 21 U/mL (ref 0–39)

## 2007-01-24 LAB — CBC WITH DIFFERENTIAL/PLATELET
BASO%: 0.8 % (ref 0.0–2.0)
Basophils Absolute: 0 10*3/uL (ref 0.0–0.1)
EOS%: 3.3 % (ref 0.0–7.0)
Eosinophils Absolute: 0.2 10*3/uL (ref 0.0–0.5)
HCT: 38 % (ref 34.8–46.6)
HGB: 12.9 g/dL (ref 11.6–15.9)
LYMPH%: 32.3 % (ref 14.0–48.0)
MCH: 28.2 pg (ref 26.0–34.0)
MCHC: 33.8 g/dL (ref 32.0–36.0)
MCV: 83.6 fL (ref 81.0–101.0)
MONO#: 0.6 10*3/uL (ref 0.1–0.9)
MONO%: 13 % (ref 0.0–13.0)
NEUT#: 2.4 10*3/uL (ref 1.5–6.5)
NEUT%: 50.6 % (ref 39.6–76.8)
Platelets: 231 10*3/uL (ref 145–400)
RBC: 4.55 10*6/uL (ref 3.70–5.32)
RDW: 18.7 % — ABNORMAL HIGH (ref 11.3–14.5)
WBC: 4.7 10*3/uL (ref 3.9–10.0)
lymph#: 1.5 10*3/uL (ref 0.9–3.3)

## 2007-01-24 LAB — LACTATE DEHYDROGENASE: LDH: 156 U/L (ref 94–250)

## 2007-01-30 ENCOUNTER — Inpatient Hospital Stay (HOSPITAL_COMMUNITY): Admission: EM | Admit: 2007-01-30 | Discharge: 2007-02-02 | Payer: Self-pay | Admitting: Emergency Medicine

## 2007-02-01 ENCOUNTER — Encounter: Payer: Self-pay | Admitting: Internal Medicine

## 2007-02-04 ENCOUNTER — Encounter (INDEPENDENT_AMBULATORY_CARE_PROVIDER_SITE_OTHER): Payer: Self-pay | Admitting: Family Medicine

## 2007-02-04 ENCOUNTER — Telehealth (INDEPENDENT_AMBULATORY_CARE_PROVIDER_SITE_OTHER): Payer: Self-pay | Admitting: *Deleted

## 2007-02-10 ENCOUNTER — Ambulatory Visit: Payer: Self-pay | Admitting: Family Medicine

## 2007-02-10 DIAGNOSIS — Z9189 Other specified personal risk factors, not elsewhere classified: Secondary | ICD-10-CM | POA: Insufficient documentation

## 2007-02-26 ENCOUNTER — Encounter (INDEPENDENT_AMBULATORY_CARE_PROVIDER_SITE_OTHER): Payer: Self-pay | Admitting: Family Medicine

## 2007-03-07 LAB — COMPREHENSIVE METABOLIC PANEL
ALT: 12 U/L (ref 0–35)
AST: 15 U/L (ref 0–37)
Albumin: 4 g/dL (ref 3.5–5.2)
Alkaline Phosphatase: 116 U/L (ref 39–117)
BUN: 12 mg/dL (ref 6–23)
CO2: 25 mEq/L (ref 19–32)
Calcium: 9.2 mg/dL (ref 8.4–10.5)
Chloride: 106 mEq/L (ref 96–112)
Creatinine, Ser: 0.77 mg/dL (ref 0.40–1.20)
Glucose, Bld: 110 mg/dL — ABNORMAL HIGH (ref 70–99)
Potassium: 3.8 mEq/L (ref 3.5–5.3)
Sodium: 143 mEq/L (ref 135–145)
Total Bilirubin: 0.4 mg/dL (ref 0.3–1.2)
Total Protein: 6.7 g/dL (ref 6.0–8.3)

## 2007-03-07 LAB — CBC WITH DIFFERENTIAL/PLATELET
BASO%: 1.5 % (ref 0.0–2.0)
Basophils Absolute: 0.1 10*3/uL (ref 0.0–0.1)
EOS%: 2.3 % (ref 0.0–7.0)
Eosinophils Absolute: 0.1 10*3/uL (ref 0.0–0.5)
HCT: 37.2 % (ref 34.8–46.6)
HGB: 12.4 g/dL (ref 11.6–15.9)
LYMPH%: 18.9 % (ref 14.0–48.0)
MCH: 28.4 pg (ref 26.0–34.0)
MCHC: 33.5 g/dL (ref 32.0–36.0)
MCV: 84.8 fL (ref 81.0–101.0)
MONO#: 0.5 10*3/uL (ref 0.1–0.9)
MONO%: 11.7 % (ref 0.0–13.0)
NEUT#: 2.8 10*3/uL (ref 1.5–6.5)
NEUT%: 65.6 % (ref 39.6–76.8)
Platelets: 214 10*3/uL (ref 145–400)
RBC: 4.38 10*6/uL (ref 3.70–5.32)
RDW: 19 % — ABNORMAL HIGH (ref 11.3–14.5)
WBC: 4.2 10*3/uL (ref 3.9–10.0)
lymph#: 0.8 10*3/uL — ABNORMAL LOW (ref 0.9–3.3)

## 2007-03-07 LAB — CANCER ANTIGEN 27.29: CA 27.29: 20 U/mL (ref 0–39)

## 2007-03-07 LAB — LACTATE DEHYDROGENASE: LDH: 144 U/L (ref 94–250)

## 2007-03-13 ENCOUNTER — Encounter: Admission: RE | Admit: 2007-03-13 | Discharge: 2007-03-13 | Payer: Self-pay | Admitting: Oncology

## 2007-03-18 ENCOUNTER — Telehealth (INDEPENDENT_AMBULATORY_CARE_PROVIDER_SITE_OTHER): Payer: Self-pay | Admitting: Family Medicine

## 2007-05-14 ENCOUNTER — Ambulatory Visit: Payer: Self-pay | Admitting: Oncology

## 2007-05-19 ENCOUNTER — Encounter (INDEPENDENT_AMBULATORY_CARE_PROVIDER_SITE_OTHER): Payer: Self-pay | Admitting: Family Medicine

## 2007-05-26 ENCOUNTER — Ambulatory Visit (HOSPITAL_BASED_OUTPATIENT_CLINIC_OR_DEPARTMENT_OTHER): Admission: RE | Admit: 2007-05-26 | Discharge: 2007-05-26 | Payer: Self-pay | Admitting: Surgery

## 2007-06-03 LAB — CBC WITH DIFFERENTIAL/PLATELET
BASO%: 0.3 % (ref 0.0–2.0)
Basophils Absolute: 0 10*3/uL (ref 0.0–0.1)
EOS%: 5 % (ref 0.0–7.0)
Eosinophils Absolute: 0.3 10*3/uL (ref 0.0–0.5)
HCT: 38.7 % (ref 34.8–46.6)
HGB: 13 g/dL (ref 11.6–15.9)
LYMPH%: 21.8 % (ref 14.0–48.0)
MCH: 29.8 pg (ref 26.0–34.0)
MCHC: 33.7 g/dL (ref 32.0–36.0)
MCV: 88.6 fL (ref 81.0–101.0)
MONO#: 0.5 10*3/uL (ref 0.1–0.9)
MONO%: 8.8 % (ref 0.0–13.0)
NEUT#: 3.3 10*3/uL (ref 1.5–6.5)
NEUT%: 64.1 % (ref 39.6–76.8)
Platelets: 237 10*3/uL (ref 145–400)
RBC: 4.36 10*6/uL (ref 3.70–5.32)
RDW: 16.4 % — ABNORMAL HIGH (ref 11.3–14.5)
WBC: 5.1 10*3/uL (ref 3.9–10.0)
lymph#: 1.1 10*3/uL (ref 0.9–3.3)

## 2007-06-04 LAB — COMPREHENSIVE METABOLIC PANEL
ALT: 13 U/L (ref 0–35)
AST: 15 U/L (ref 0–37)
Albumin: 4.4 g/dL (ref 3.5–5.2)
Alkaline Phosphatase: 123 U/L — ABNORMAL HIGH (ref 39–117)
BUN: 12 mg/dL (ref 6–23)
CO2: 22 mEq/L (ref 19–32)
Calcium: 9.7 mg/dL (ref 8.4–10.5)
Chloride: 107 mEq/L (ref 96–112)
Creatinine, Ser: 0.66 mg/dL (ref 0.40–1.20)
Glucose, Bld: 90 mg/dL (ref 70–99)
Potassium: 4.2 mEq/L (ref 3.5–5.3)
Sodium: 141 mEq/L (ref 135–145)
Total Bilirubin: 0.3 mg/dL (ref 0.3–1.2)
Total Protein: 7 g/dL (ref 6.0–8.3)

## 2007-06-04 LAB — VITAMIN D 25 HYDROXY (VIT D DEFICIENCY, FRACTURES): Vit D, 25-Hydroxy: 10 ng/mL — ABNORMAL LOW (ref 30–89)

## 2007-06-04 LAB — LACTATE DEHYDROGENASE: LDH: 157 U/L (ref 94–250)

## 2007-06-04 LAB — CANCER ANTIGEN 27.29: CA 27.29: 23 U/mL (ref 0–39)

## 2007-06-06 LAB — VITAMIN D 1,25 DIHYDROXY: Vit D, 1,25-Dihydroxy: 34 pg/mL (ref 6–62)

## 2007-08-27 ENCOUNTER — Encounter: Admission: RE | Admit: 2007-08-27 | Discharge: 2007-08-27 | Payer: Self-pay | Admitting: Oncology

## 2007-09-16 IMAGING — CT CT PELVIS W/ CM
2 of 5 series · 13 of 46 positions shown, 15 images · IV contrast (omnipaque)
Comparison: MR of the breast 06/18/2006. No prior PET.
COMPARISON: PET of same day and breast MR of 06/18/2006

CLINICAL DATA: Staging of new left-sided breast cancer.
FDG PET-CT TUMOR IMAGING (WHOLE BODY):
Fasting Blood Glucose:  102
TECHNIQUE: 15.9 mCi F-18 FDG was injected intravenously via the right AC.  Full-ring PET imaging was performed from the skull vertex through the lower extremities 60 minutes after injection.  CT data was obtained and used for attenuation correction and anatomic localization only.  (This was not acquired as a diagnostic CT examination.)
TECHNIQUE: Multidetector CT imaging of the chest was performed following the standard protocol during bolus administration of intravenous contrast.
Contrast:  125 cc Omnipaque 300
TECHNIQUE: Multidetector CT imaging of the abdomen was performed following the standard protocol during bolus administration of intravenous contrast.
TECHNIQUE: Multidetector CT imaging of the pelvis was performed following the standard protocol during bolus administration of intravenous contrast.

[Series 2: cap 5.0 b40f · axial · 0.78mm/px · z∈[-578,-72]mm · 10 of 115 slices shown, 12 images]
[im 7/115  soft-tissue]
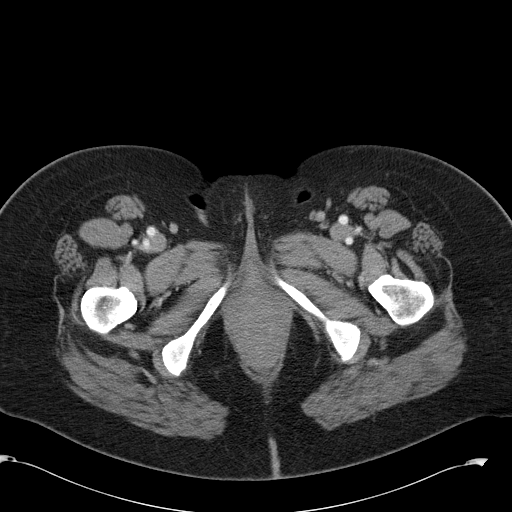
[im 7/115  bone]
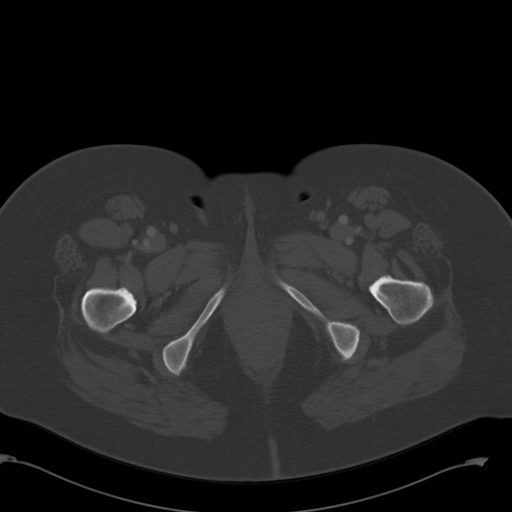
[im 20/115  soft-tissue]
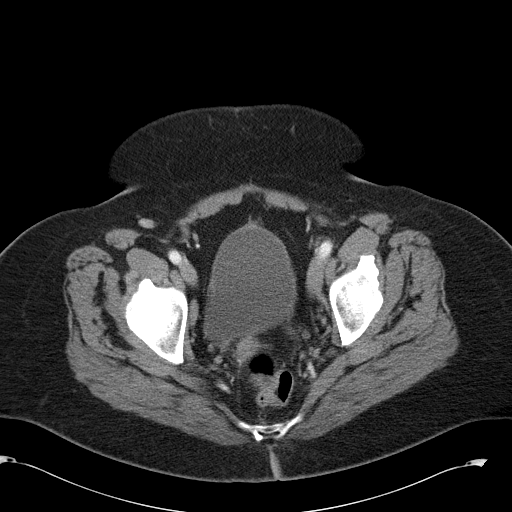
[im 32/115  soft-tissue]
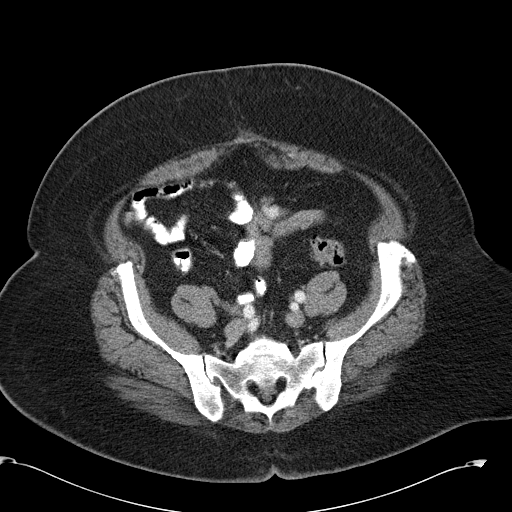
[im 39/115  soft-tissue]
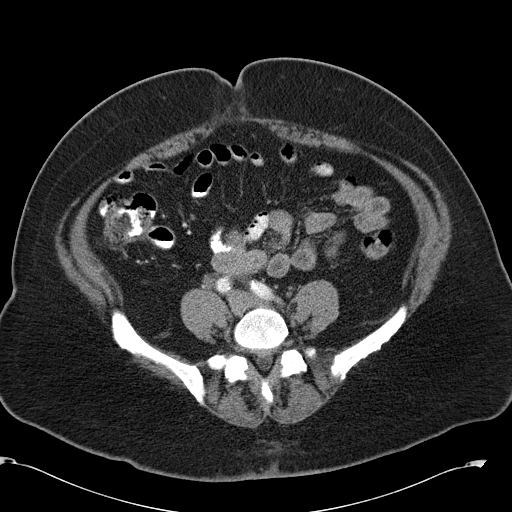
[im 51/115  soft-tissue]
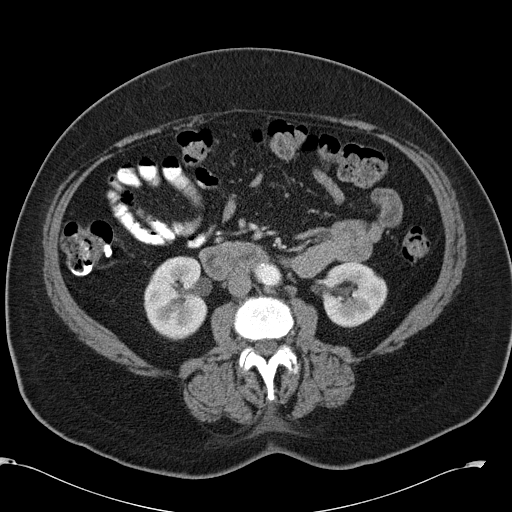
[im 64/115  soft-tissue]
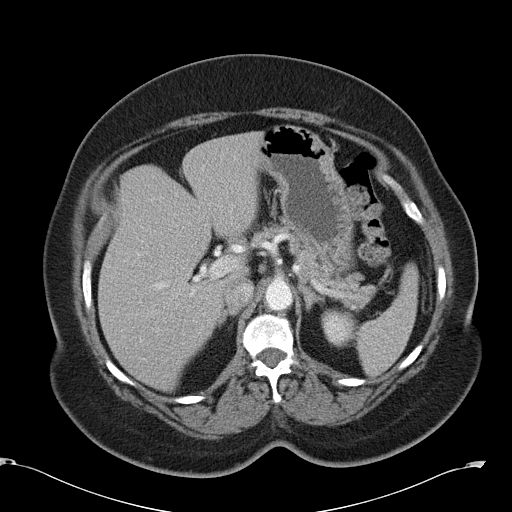
[im 77/115  soft-tissue]
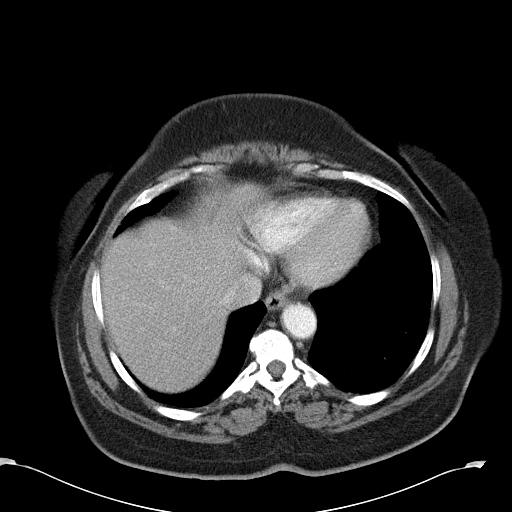
[im 83/115  soft-tissue]
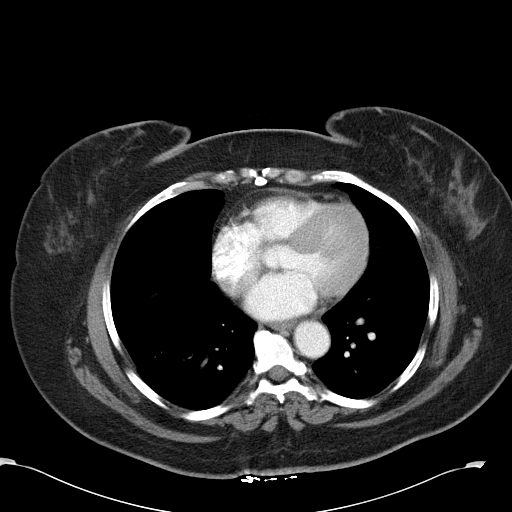
[im 96/115  soft-tissue]
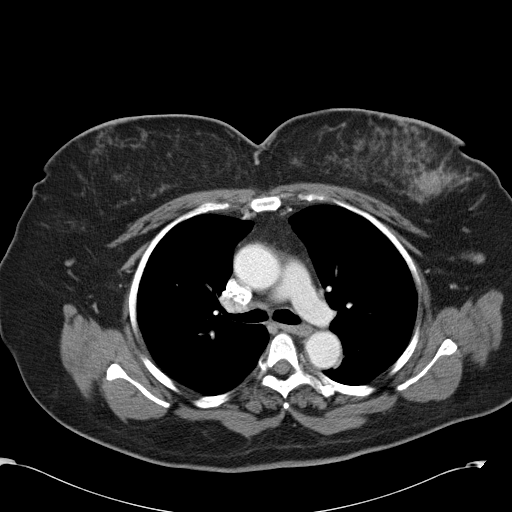
[im 96/115  bone]
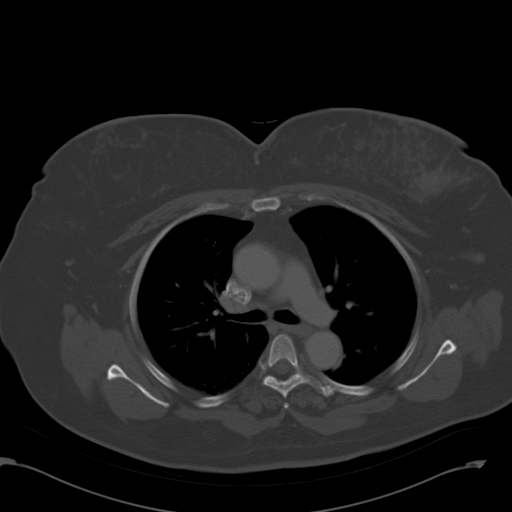
[im 108/115  soft-tissue]
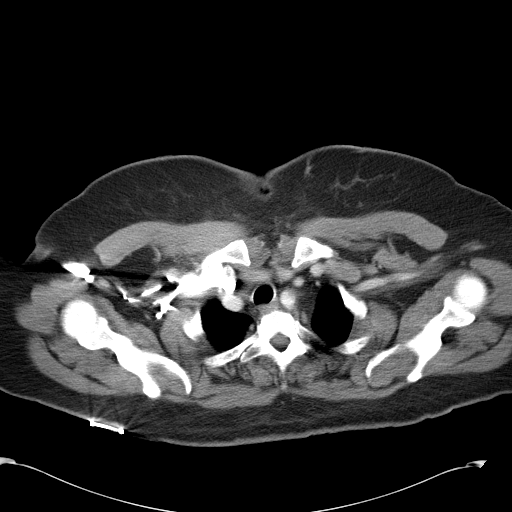

[Series 602: <mpr thick range> · coronal · 1.12mm/px · 3 of 84 slices shown]
[im 28/84  soft-tissue]
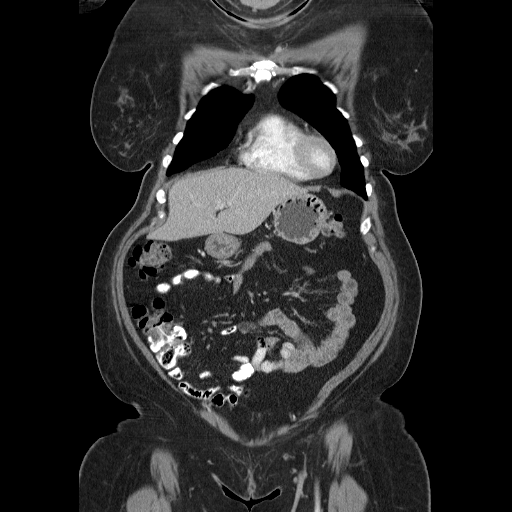
[im 37/84  soft-tissue]
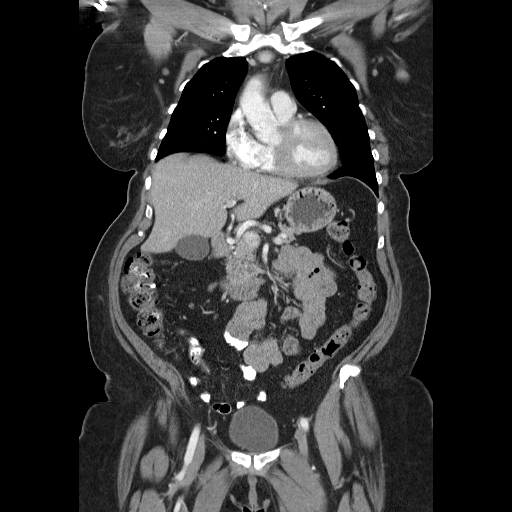
[im 47/84  soft-tissue]
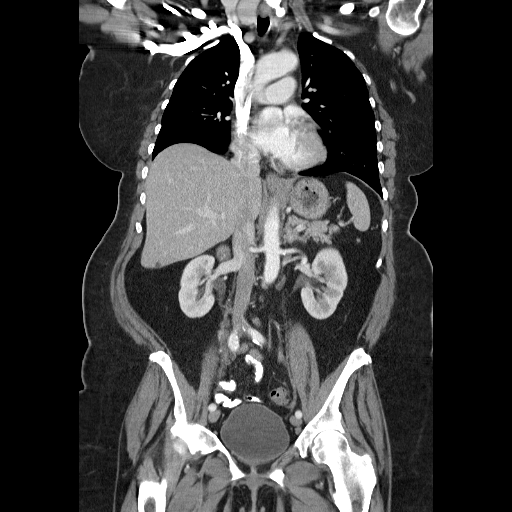

[13 of 46 positions shown; findings below may reference images not displayed]

FINDINGS: PET images demonstrate apparent activity about the right side of the nasopharynx adjacent to the right pterygoid plates. This is felt to be due to misregistration secondary to motion between the PET and CT imaging. 
However, at the glossopalatine sulcus on the right, there is an area of more focal hypermetabolism measuring 4.0 gram per milliliter.  This corresponds to under distention in this region and more inferiorly in the right piriform sinus. There is no convincing evidence of soft tissue mass in this area. 
There is mild hypermetabolism involving a left axillary lymph node on image 54 at a maximum SUV of 2.3 grams per milliliter. Although this node maintains its fatty hilum, given its location, it is highly suspicious. Left subpectoral lymph nodes do not demonstrate definite hypermetabolism. An axillary node just cephalad to the largest node measures only a maximum SUV of 1.0 grams per milliliter on image 46. 
The left breast primary measures approximately 2.6 x 2.9 cm and a maximum SUV of 34.8 grams per milliliter. 
Sternomanubrial activity is felt to be degenerative and mild. 
Mildly heterogeneous activity involves the lower lumbar spine. The most focal area is in the central aspect of the sacrum with hypermetabolism at 3.3 grams per milliliter on image 150. This is immediately superior to a diminutive S1-2 disk. There is no definite correlative abnormality on CT. 
A similar focus of hypermetabolism at 2.0 grams per milliliter in the right ischial tuberosity is without correlate on CT. 
CT images performed for attenuation correction demonstrate minimal mucosal thickening or mucosal polyp in the left maxillary sinus. Images of the chest, abdomen, and pelvis will be deferred to diagnostic CTs below.
IMPRESSION: 1.  The left breast primary is markedly hypermetabolic with highly suspicious left axillary lymph node or nodes as described. 
2.  No evidence of extra axillary or distant metastatic disease. 
3.  Foci of osseous activity in the sacrum and right ischium as described are felt to be physiologic. These warrant attention on followup exams. If more complete imaging evaluation is desired, MRI of the pelvis may be useful with special attention to these areas. 
4.  Right glossopalatine sulcus activity is likely physiologic. This could be evaluated by physical exam correlation or followed on subsequent exams. 
CHEST CT WITH CONTRAST:
FINDINGS: Lung windows demonstrate no nodules. No airspace opacities. 
Soft tissue windows demonstrate small right subpectoral lymph nodes. 
The left axillary lymph node corresponding to mild hypermetabolism on PET measures 2.3 x 1.2 cm on image 19. Small left subpectoral lymph nodes are also identified. 
The left breast primary measures approximately 2.1 x 2.4 cm. Please see MR description for more complete evaluation. Left supraclavicular lymph nodes are mildly prominent but not hypermetabolic on PET.  Right lobe of the thyroid is prominent. 
Atherosclerosis involves the origin of the left subclavian artery on image 10. 
The heart size is normal. There is no pericardial or pleural effusion. No mediastinal or hilar adenopathy. Small hiatal hernia. 
No internal mammary adenopathy.
IMPRESSION: 1.  Left breast primary as well as prominent left axillary lymph node, which is mildly hypermetabolic on PET.  No evidence of extra axillary disease in the chest. 
2.  Prominent left supraclavicular lymph nodes were not hypermetabolic by PET but warrant attention on followup exams. 
ABDOMEN CT WITH CONTRAST:
FINDINGS: Inferior right hepatic subcentimeter lesion is likely a cyst on image 60. There is a splenule. Stomach, pancreas, gallbladder, adrenal glands, and kidneys are normal. Atherosclerosis at the origin of the left renal artery. Prominent right gonadal vein may relate to a component of pelvic congestion syndrome. No retroperitoneal or retrocrural adenopathy. 
Abdominal bowel loops are normal and there is no ascites.
IMPRESSION: 1.  No acute process or evidence of metastatic disease within the abdomen. 
2.  Right hepatic lobe lesion is likely a small cyst. 
PELVIS CT WITH CONTRAST:
FINDINGS: There is right colon diverticulosis. Pelvic small bowel is normal. Right obturator node measures 7 mm, not pathologic by size. Hysterectomy. Urinary bladder is normal. There is no adnexal mass. 
Bone windows demonstrate no CT correlate in the right ischium for the mild hypermetabolism. No definite CT correlate in the central sacrum. Sagittal reformats do demonstrate a disk bulge at the lumbosacral junction.
IMPRESSION: 1.  Hysterectomy but no acute process relative to metastatic disease in the pelvis.
2.  No definite osseous correlate for the heterogenous mild marrow activity in the pelvis described on PET.  Attention on followup.

## 2007-09-23 ENCOUNTER — Ambulatory Visit: Payer: Self-pay | Admitting: Oncology

## 2007-09-25 LAB — CBC WITH DIFFERENTIAL/PLATELET
BASO%: 0.3 % (ref 0.0–2.0)
Basophils Absolute: 0 10*3/uL (ref 0.0–0.1)
EOS%: 4.2 % (ref 0.0–7.0)
Eosinophils Absolute: 0.2 10*3/uL (ref 0.0–0.5)
HCT: 41.2 % (ref 34.8–46.6)
HGB: 13.9 g/dL (ref 11.6–15.9)
LYMPH%: 24.2 % (ref 14.0–48.0)
MCH: 30.4 pg (ref 26.0–34.0)
MCHC: 33.6 g/dL (ref 32.0–36.0)
MCV: 90.3 fL (ref 81.0–101.0)
MONO#: 0.7 10*3/uL (ref 0.1–0.9)
MONO%: 12.6 % (ref 0.0–13.0)
NEUT#: 3.2 10*3/uL (ref 1.5–6.5)
NEUT%: 58.7 % (ref 39.6–76.8)
Platelets: 222 10*3/uL (ref 145–400)
RBC: 4.56 10*6/uL (ref 3.70–5.32)
RDW: 15.2 % — ABNORMAL HIGH (ref 11.3–14.5)
WBC: 5.5 10*3/uL (ref 3.9–10.0)
lymph#: 1.3 10*3/uL (ref 0.9–3.3)

## 2007-09-26 LAB — LACTATE DEHYDROGENASE: LDH: 159 U/L (ref 94–250)

## 2007-09-26 LAB — COMPREHENSIVE METABOLIC PANEL
ALT: 12 U/L (ref 0–35)
AST: 13 U/L (ref 0–37)
Albumin: 4.2 g/dL (ref 3.5–5.2)
Alkaline Phosphatase: 139 U/L — ABNORMAL HIGH (ref 39–117)
BUN: 16 mg/dL (ref 6–23)
CO2: 25 mEq/L (ref 19–32)
Calcium: 9.8 mg/dL (ref 8.4–10.5)
Chloride: 106 mEq/L (ref 96–112)
Creatinine, Ser: 0.76 mg/dL (ref 0.40–1.20)
Glucose, Bld: 101 mg/dL — ABNORMAL HIGH (ref 70–99)
Potassium: 3.6 mEq/L (ref 3.5–5.3)
Sodium: 141 mEq/L (ref 135–145)
Total Bilirubin: 0.4 mg/dL (ref 0.3–1.2)
Total Protein: 7.1 g/dL (ref 6.0–8.3)

## 2007-09-26 LAB — VITAMIN D 25 HYDROXY (VIT D DEFICIENCY, FRACTURES): Vit D, 25-Hydroxy: 13 ng/mL — ABNORMAL LOW (ref 30–89)

## 2007-09-26 LAB — CANCER ANTIGEN 27.29: CA 27.29: 27 U/mL (ref 0–39)

## 2007-10-02 ENCOUNTER — Encounter (INDEPENDENT_AMBULATORY_CARE_PROVIDER_SITE_OTHER): Payer: Self-pay | Admitting: Family Medicine

## 2007-10-27 ENCOUNTER — Ambulatory Visit: Payer: Self-pay | Admitting: Family Medicine

## 2007-10-30 ENCOUNTER — Encounter (INDEPENDENT_AMBULATORY_CARE_PROVIDER_SITE_OTHER): Payer: Self-pay | Admitting: Family Medicine

## 2007-10-30 LAB — CONVERTED CEMR LAB
ALT: 10 units/L (ref 0–35)
AST: 12 units/L (ref 0–37)
Albumin: 4.2 g/dL (ref 3.5–5.2)
Alkaline Phosphatase: 140 units/L — ABNORMAL HIGH (ref 39–117)
BUN: 14 mg/dL (ref 6–23)
Basophils Absolute: 0 10*3/uL (ref 0.0–0.1)
Basophils Relative: 0 % (ref 0–1)
CO2: 20 meq/L (ref 19–32)
Calcium: 9 mg/dL (ref 8.4–10.5)
Chloride: 107 meq/L (ref 96–112)
Cholesterol: 141 mg/dL (ref 0–200)
Creatinine, Ser: 0.64 mg/dL (ref 0.40–1.20)
Eosinophils Absolute: 0.2 10*3/uL (ref 0.0–0.7)
Eosinophils Relative: 4 % (ref 0–5)
Glucose, Bld: 101 mg/dL — ABNORMAL HIGH (ref 70–99)
HCT: 44 % (ref 36.0–46.0)
HDL: 47 mg/dL (ref >39)
Hemoglobin: 13.7 g/dL (ref 12.0–15.0)
LDL Cholesterol: 69 mg/dL (ref 0–99)
Lymphocytes Relative: 30 % (ref 12–46)
Lymphs Abs: 1.7 10*3/uL (ref 0.7–4.0)
MCHC: 31.1 g/dL (ref 30.0–36.0)
MCV: 94.8 fL (ref 78.0–100.0)
Monocytes Absolute: 0.4 10*3/uL (ref 0.1–1.0)
Monocytes Relative: 7 % (ref 3–12)
Neutro Abs: 3.4 10*3/uL (ref 1.7–7.7)
Neutrophils Relative %: 59 % (ref 43–77)
Platelets: 216 10*3/uL (ref 150–400)
Potassium: 4 meq/L (ref 3.5–5.3)
RBC: 4.64 M/uL (ref 3.87–5.11)
RDW: 15.8 % — ABNORMAL HIGH (ref 11.5–15.5)
Sodium: 143 meq/L (ref 135–145)
TSH: 1.177 microintl units/mL (ref 0.350–4.50)
Total Bilirubin: 0.5 mg/dL (ref 0.3–1.2)
Total CHOL/HDL Ratio: 3
Total Protein: 6.8 g/dL (ref 6.0–8.3)
Triglycerides: 123 mg/dL (ref <150)
VLDL: 25 mg/dL (ref 0–40)
WBC: 5.8 10*3/uL (ref 4.0–10.5)

## 2007-11-13 ENCOUNTER — Encounter (INDEPENDENT_AMBULATORY_CARE_PROVIDER_SITE_OTHER): Payer: Self-pay | Admitting: *Deleted

## 2007-11-21 ENCOUNTER — Encounter (INDEPENDENT_AMBULATORY_CARE_PROVIDER_SITE_OTHER): Payer: Self-pay | Admitting: Family Medicine

## 2007-11-24 ENCOUNTER — Encounter (INDEPENDENT_AMBULATORY_CARE_PROVIDER_SITE_OTHER): Payer: Self-pay | Admitting: Family Medicine

## 2008-01-06 ENCOUNTER — Ambulatory Visit: Payer: Self-pay | Admitting: Family Medicine

## 2008-01-06 DIAGNOSIS — A5901 Trichomonal vulvovaginitis: Secondary | ICD-10-CM | POA: Insufficient documentation

## 2008-01-06 DIAGNOSIS — R3129 Other microscopic hematuria: Secondary | ICD-10-CM | POA: Insufficient documentation

## 2008-01-06 LAB — CONVERTED CEMR LAB
Bilirubin Urine: NEGATIVE
Glucose, Urine, Semiquant: NEGATIVE
Ketones, urine, test strip: NEGATIVE
Nitrite: NEGATIVE
Protein, U semiquant: NEGATIVE
Specific Gravity, Urine: >=1.03
Urobilinogen, UA: 0.2
pH: 5

## 2008-01-07 ENCOUNTER — Encounter (INDEPENDENT_AMBULATORY_CARE_PROVIDER_SITE_OTHER): Payer: Self-pay | Admitting: Family Medicine

## 2008-01-28 ENCOUNTER — Ambulatory Visit: Payer: Self-pay | Admitting: Family Medicine

## 2008-02-16 ENCOUNTER — Ambulatory Visit: Payer: Self-pay | Admitting: Oncology

## 2008-02-23 LAB — CBC WITH DIFFERENTIAL/PLATELET
BASO%: 0.3 % (ref 0.0–2.0)
Basophils Absolute: 0 10*3/uL (ref 0.0–0.1)
EOS%: 2.8 % (ref 0.0–7.0)
Eosinophils Absolute: 0.2 10*3/uL (ref 0.0–0.5)
HCT: 39.1 % (ref 34.8–46.6)
HGB: 13.2 g/dL (ref 11.6–15.9)
LYMPH%: 16.6 % (ref 14.0–48.0)
MCH: 30.8 pg (ref 26.0–34.0)
MCHC: 33.7 g/dL (ref 32.0–36.0)
MCV: 91.4 fL (ref 81.0–101.0)
MONO#: 0.5 10*3/uL (ref 0.1–0.9)
MONO%: 6.2 % (ref 0.0–13.0)
NEUT#: 6.1 10*3/uL (ref 1.5–6.5)
NEUT%: 74.1 % (ref 39.6–76.8)
Platelets: 253 10*3/uL (ref 145–400)
RBC: 4.28 10*6/uL (ref 3.70–5.32)
RDW: 15 % — ABNORMAL HIGH (ref 11.3–14.5)
WBC: 8.2 10*3/uL (ref 3.9–10.0)
lymph#: 1.4 10*3/uL (ref 0.9–3.3)

## 2008-02-24 LAB — COMPREHENSIVE METABOLIC PANEL
ALT: 14 U/L (ref 0–35)
AST: 14 U/L (ref 0–37)
Albumin: 4.2 g/dL (ref 3.5–5.2)
Alkaline Phosphatase: 121 U/L — ABNORMAL HIGH (ref 39–117)
BUN: 12 mg/dL (ref 6–23)
CO2: 27 mEq/L (ref 19–32)
Calcium: 9.5 mg/dL (ref 8.4–10.5)
Chloride: 105 mEq/L (ref 96–112)
Creatinine, Ser: 0.76 mg/dL (ref 0.40–1.20)
Glucose, Bld: 108 mg/dL — ABNORMAL HIGH (ref 70–99)
Potassium: 4.1 mEq/L (ref 3.5–5.3)
Sodium: 143 mEq/L (ref 135–145)
Total Bilirubin: 0.4 mg/dL (ref 0.3–1.2)
Total Protein: 7 g/dL (ref 6.0–8.3)

## 2008-02-24 LAB — CANCER ANTIGEN 27.29: CA 27.29: 24 U/mL (ref 0–39)

## 2008-02-24 LAB — LACTATE DEHYDROGENASE: LDH: 169 U/L (ref 94–250)

## 2008-02-25 ENCOUNTER — Encounter (INDEPENDENT_AMBULATORY_CARE_PROVIDER_SITE_OTHER): Payer: Self-pay | Admitting: Family Medicine

## 2008-05-14 ENCOUNTER — Telehealth (INDEPENDENT_AMBULATORY_CARE_PROVIDER_SITE_OTHER): Payer: Self-pay | Admitting: Family Medicine

## 2008-07-12 ENCOUNTER — Telehealth (INDEPENDENT_AMBULATORY_CARE_PROVIDER_SITE_OTHER): Payer: Self-pay | Admitting: Family Medicine

## 2008-07-13 ENCOUNTER — Encounter: Payer: Self-pay | Admitting: Family Medicine

## 2008-08-24 ENCOUNTER — Telehealth (INDEPENDENT_AMBULATORY_CARE_PROVIDER_SITE_OTHER): Payer: Self-pay | Admitting: Family Medicine

## 2008-08-25 ENCOUNTER — Ambulatory Visit: Payer: Self-pay | Admitting: Oncology

## 2008-08-27 ENCOUNTER — Encounter: Admission: RE | Admit: 2008-08-27 | Discharge: 2008-08-27 | Payer: Self-pay | Admitting: Oncology

## 2008-08-27 LAB — CBC WITH DIFFERENTIAL/PLATELET
BASO%: 0.2 % (ref 0.0–2.0)
Basophils Absolute: 0 10*3/uL (ref 0.0–0.1)
EOS%: 3.5 % (ref 0.0–7.0)
Eosinophils Absolute: 0.3 10*3/uL (ref 0.0–0.5)
HCT: 40.6 % (ref 34.8–46.6)
HGB: 13.4 g/dL (ref 11.6–15.9)
LYMPH%: 25.1 % (ref 14.0–49.7)
MCH: 29.8 pg (ref 25.1–34.0)
MCHC: 33 g/dL (ref 31.5–36.0)
MCV: 90.1 fL (ref 79.5–101.0)
MONO#: 0.4 10*3/uL (ref 0.1–0.9)
MONO%: 6.2 % (ref 0.0–14.0)
NEUT#: 4.7 10*3/uL (ref 1.5–6.5)
NEUT%: 65 % (ref 38.4–76.8)
Platelets: 239 10*3/uL (ref 145–400)
RBC: 4.5 10*6/uL (ref 3.70–5.45)
RDW: 16.7 % — ABNORMAL HIGH (ref 11.2–14.5)
WBC: 7.3 10*3/uL (ref 3.9–10.3)
lymph#: 1.8 10*3/uL (ref 0.9–3.3)

## 2008-08-30 LAB — COMPREHENSIVE METABOLIC PANEL
ALT: 10 U/L (ref 0–35)
AST: 14 U/L (ref 0–37)
Albumin: 4.3 g/dL (ref 3.5–5.2)
Alkaline Phosphatase: 111 U/L (ref 39–117)
BUN: 10 mg/dL (ref 6–23)
CO2: 26 mEq/L (ref 19–32)
Calcium: 9.3 mg/dL (ref 8.4–10.5)
Chloride: 107 mEq/L (ref 96–112)
Creatinine, Ser: 0.72 mg/dL (ref 0.40–1.20)
Glucose, Bld: 100 mg/dL — ABNORMAL HIGH (ref 70–99)
Potassium: 3.9 mEq/L (ref 3.5–5.3)
Sodium: 142 mEq/L (ref 135–145)
Total Bilirubin: 0.4 mg/dL (ref 0.3–1.2)
Total Protein: 7.3 g/dL (ref 6.0–8.3)

## 2008-08-30 LAB — CANCER ANTIGEN 27.29: CA 27.29: 27 U/mL (ref 0–39)

## 2008-08-30 LAB — VITAMIN D 25 HYDROXY (VIT D DEFICIENCY, FRACTURES): Vit D, 25-Hydroxy: 14 ng/mL — ABNORMAL LOW (ref 30–89)

## 2008-08-30 LAB — LACTATE DEHYDROGENASE: LDH: 157 U/L (ref 94–250)

## 2008-09-01 ENCOUNTER — Encounter (INDEPENDENT_AMBULATORY_CARE_PROVIDER_SITE_OTHER): Payer: Self-pay | Admitting: Family Medicine

## 2008-10-26 ENCOUNTER — Telehealth (INDEPENDENT_AMBULATORY_CARE_PROVIDER_SITE_OTHER): Payer: Self-pay | Admitting: Internal Medicine

## 2008-11-01 ENCOUNTER — Encounter (INDEPENDENT_AMBULATORY_CARE_PROVIDER_SITE_OTHER): Payer: Self-pay | Admitting: Nurse Practitioner

## 2008-11-01 ENCOUNTER — Ambulatory Visit: Payer: Self-pay | Admitting: Internal Medicine

## 2008-11-01 LAB — CONVERTED CEMR LAB
ALT: 12 units/L (ref 0–35)
AST: 15 units/L (ref 0–37)
Albumin: 4.3 g/dL (ref 3.5–5.2)
Alkaline Phosphatase: 108 units/L (ref 39–117)
BUN: 15 mg/dL (ref 6–23)
Basophils Absolute: 0 10*3/uL (ref 0.0–0.1)
Basophils Relative: 0 % (ref 0–1)
CO2: 26 meq/L (ref 19–32)
Calcium: 9.3 mg/dL (ref 8.4–10.5)
Chloride: 104 meq/L (ref 96–112)
Cholesterol: 126 mg/dL (ref 0–200)
Creatinine, Ser: 0.88 mg/dL (ref 0.40–1.20)
Eosinophils Absolute: 0.2 10*3/uL (ref 0.0–0.7)
Eosinophils Relative: 3 % (ref 0–5)
Glucose, Bld: 80 mg/dL (ref 70–99)
HCT: 43.3 % (ref 36.0–46.0)
HDL: 40 mg/dL (ref >39)
Hemoglobin: 13.2 g/dL (ref 12.0–15.0)
LDL Cholesterol: 68 mg/dL (ref 0–99)
Lymphocytes Relative: 36 % (ref 12–46)
Lymphs Abs: 2.6 10*3/uL (ref 0.7–4.0)
MCHC: 30.5 g/dL (ref 30.0–36.0)
MCV: 93.9 fL (ref 78.0–100.0)
Monocytes Absolute: 0.6 10*3/uL (ref 0.1–1.0)
Monocytes Relative: 8 % (ref 3–12)
Neutro Abs: 3.8 10*3/uL (ref 1.7–7.7)
Neutrophils Relative %: 53 % (ref 43–77)
Platelets: 248 10*3/uL (ref 150–400)
Potassium: 5 meq/L (ref 3.5–5.3)
RBC: 4.61 M/uL (ref 3.87–5.11)
RDW: 16.9 % — ABNORMAL HIGH (ref 11.5–15.5)
Sodium: 143 meq/L (ref 135–145)
Total Bilirubin: 0.3 mg/dL (ref 0.3–1.2)
Total CHOL/HDL Ratio: 3.2
Total Protein: 6.7 g/dL (ref 6.0–8.3)
Triglycerides: 90 mg/dL (ref <150)
VLDL: 18 mg/dL (ref 0–40)
WBC: 7.1 10*3/uL (ref 4.0–10.5)

## 2008-11-02 ENCOUNTER — Encounter (INDEPENDENT_AMBULATORY_CARE_PROVIDER_SITE_OTHER): Payer: Self-pay | Admitting: Nurse Practitioner

## 2008-12-28 ENCOUNTER — Ambulatory Visit: Payer: Self-pay | Admitting: Nurse Practitioner

## 2008-12-28 DIAGNOSIS — G8929 Other chronic pain: Secondary | ICD-10-CM

## 2008-12-28 DIAGNOSIS — E669 Obesity, unspecified: Secondary | ICD-10-CM

## 2008-12-28 DIAGNOSIS — M25561 Pain in right knee: Secondary | ICD-10-CM

## 2008-12-28 DIAGNOSIS — K5900 Constipation, unspecified: Secondary | ICD-10-CM | POA: Insufficient documentation

## 2008-12-28 DIAGNOSIS — M170 Bilateral primary osteoarthritis of knee: Secondary | ICD-10-CM | POA: Insufficient documentation

## 2008-12-28 HISTORY — DX: Obesity, unspecified: E66.9

## 2008-12-28 HISTORY — DX: Other chronic pain: G89.29

## 2008-12-28 LAB — CONVERTED CEMR LAB
Cholesterol, target level: 200 mg/dL
HDL goal, serum: 40 mg/dL
LDL Goal: 130 mg/dL
Microalb, Ur: 1.12 mg/dL (ref 0.00–1.89)

## 2008-12-30 ENCOUNTER — Ambulatory Visit (HOSPITAL_COMMUNITY): Admission: RE | Admit: 2008-12-30 | Discharge: 2008-12-30 | Payer: Self-pay | Admitting: Nurse Practitioner

## 2009-02-18 ENCOUNTER — Encounter (INDEPENDENT_AMBULATORY_CARE_PROVIDER_SITE_OTHER): Payer: Self-pay | Admitting: Nurse Practitioner

## 2009-02-22 ENCOUNTER — Ambulatory Visit: Payer: Self-pay | Admitting: Oncology

## 2009-02-25 LAB — CBC WITH DIFFERENTIAL/PLATELET
BASO%: 0.2 % (ref 0.0–2.0)
Basophils Absolute: 0 10*3/uL (ref 0.0–0.1)
EOS%: 3.3 % (ref 0.0–7.0)
Eosinophils Absolute: 0.3 10*3/uL (ref 0.0–0.5)
HCT: 40.5 % (ref 34.8–46.6)
HGB: 13.3 g/dL (ref 11.6–15.9)
LYMPH%: 18.9 % (ref 14.0–49.7)
MCH: 29.9 pg (ref 25.1–34.0)
MCHC: 32.8 g/dL (ref 31.5–36.0)
MCV: 91.2 fL (ref 79.5–101.0)
MONO#: 0.9 10*3/uL (ref 0.1–0.9)
MONO%: 10.1 % (ref 0.0–14.0)
NEUT#: 5.7 10*3/uL (ref 1.5–6.5)
NEUT%: 67.5 % (ref 38.4–76.8)
Platelets: 264 10*3/uL (ref 145–400)
RBC: 4.44 10*6/uL (ref 3.70–5.45)
RDW: 16.5 % — ABNORMAL HIGH (ref 11.2–14.5)
WBC: 8.5 10*3/uL (ref 3.9–10.3)
lymph#: 1.6 10*3/uL (ref 0.9–3.3)

## 2009-02-25 LAB — VITAMIN D 25 HYDROXY (VIT D DEFICIENCY, FRACTURES): Vit D, 25-Hydroxy: 30 ng/mL (ref 30–89)

## 2009-02-25 LAB — COMPREHENSIVE METABOLIC PANEL
ALT: 10 U/L (ref 0–35)
AST: 13 U/L (ref 0–37)
Albumin: 4.4 g/dL (ref 3.5–5.2)
Alkaline Phosphatase: 123 U/L — ABNORMAL HIGH (ref 39–117)
BUN: 15 mg/dL (ref 6–23)
CO2: 23 mEq/L (ref 19–32)
Calcium: 9.3 mg/dL (ref 8.4–10.5)
Chloride: 107 mEq/L (ref 96–112)
Creatinine, Ser: 0.87 mg/dL (ref 0.40–1.20)
Glucose, Bld: 94 mg/dL (ref 70–99)
Potassium: 3.8 mEq/L (ref 3.5–5.3)
Sodium: 144 mEq/L (ref 135–145)
Total Bilirubin: 0.2 mg/dL — ABNORMAL LOW (ref 0.3–1.2)
Total Protein: 7.1 g/dL (ref 6.0–8.3)

## 2009-02-25 LAB — LACTATE DEHYDROGENASE: LDH: 155 U/L (ref 94–250)

## 2009-02-25 LAB — CANCER ANTIGEN 27.29: CA 27.29: 27 U/mL (ref 0–39)

## 2009-04-08 ENCOUNTER — Telehealth: Payer: Self-pay | Admitting: Physician Assistant

## 2009-06-01 ENCOUNTER — Telehealth: Payer: Self-pay | Admitting: Physician Assistant

## 2009-06-27 ENCOUNTER — Telehealth: Payer: Self-pay | Admitting: Physician Assistant

## 2009-08-25 ENCOUNTER — Ambulatory Visit: Payer: Self-pay | Admitting: Oncology

## 2009-08-29 ENCOUNTER — Encounter: Admission: RE | Admit: 2009-08-29 | Discharge: 2009-08-29 | Payer: Self-pay | Admitting: Oncology

## 2009-09-02 LAB — CBC WITH DIFFERENTIAL/PLATELET
BASO%: 0.6 % (ref 0.0–2.0)
Basophils Absolute: 0 10*3/uL (ref 0.0–0.1)
EOS%: 2.9 % (ref 0.0–7.0)
Eosinophils Absolute: 0.2 10*3/uL (ref 0.0–0.5)
HCT: 39.5 % (ref 34.8–46.6)
HGB: 13.3 g/dL (ref 11.6–15.9)
LYMPH%: 20.1 % (ref 14.0–49.7)
MCH: 29.7 pg (ref 25.1–34.0)
MCHC: 33.5 g/dL (ref 31.5–36.0)
MCV: 88.7 fL (ref 79.5–101.0)
MONO#: 0.4 10*3/uL (ref 0.1–0.9)
MONO%: 5.7 % (ref 0.0–14.0)
NEUT#: 5.4 10*3/uL (ref 1.5–6.5)
NEUT%: 70.7 % (ref 38.4–76.8)
Platelets: 275 10*3/uL (ref 145–400)
RBC: 4.46 10*6/uL (ref 3.70–5.45)
RDW: 17.1 % — ABNORMAL HIGH (ref 11.2–14.5)
WBC: 7.6 10*3/uL (ref 3.9–10.3)
lymph#: 1.5 10*3/uL (ref 0.9–3.3)

## 2009-09-02 LAB — COMPREHENSIVE METABOLIC PANEL
ALT: 10 U/L (ref 0–35)
AST: 15 U/L (ref 0–37)
Albumin: 4.1 g/dL (ref 3.5–5.2)
Alkaline Phosphatase: 95 U/L (ref 39–117)
BUN: 13 mg/dL (ref 6–23)
CO2: 26 mEq/L (ref 19–32)
Calcium: 9.1 mg/dL (ref 8.4–10.5)
Chloride: 104 mEq/L (ref 96–112)
Creatinine, Ser: 1.01 mg/dL (ref 0.40–1.20)
Glucose, Bld: 121 mg/dL — ABNORMAL HIGH (ref 70–99)
Potassium: 3.8 mEq/L (ref 3.5–5.3)
Sodium: 140 mEq/L (ref 135–145)
Total Bilirubin: 0.4 mg/dL (ref 0.3–1.2)
Total Protein: 6.9 g/dL (ref 6.0–8.3)

## 2009-09-02 LAB — LACTATE DEHYDROGENASE: LDH: 159 U/L (ref 94–250)

## 2009-09-02 LAB — CANCER ANTIGEN 27.29: CA 27.29: 22 U/mL (ref 0–39)

## 2009-09-02 LAB — VITAMIN D 25 HYDROXY (VIT D DEFICIENCY, FRACTURES): Vit D, 25-Hydroxy: 45 ng/mL (ref 30–89)

## 2010-01-06 ENCOUNTER — Telehealth (INDEPENDENT_AMBULATORY_CARE_PROVIDER_SITE_OTHER): Payer: Self-pay | Admitting: Nurse Practitioner

## 2010-02-28 ENCOUNTER — Ambulatory Visit: Payer: Self-pay | Admitting: Oncology

## 2010-03-02 LAB — CBC WITH DIFFERENTIAL/PLATELET
BASO%: 0.6 % (ref 0.0–2.0)
Basophils Absolute: 0 10*3/uL (ref 0.0–0.1)
EOS%: 3.3 % (ref 0.0–7.0)
Eosinophils Absolute: 0.3 10*3/uL (ref 0.0–0.5)
HCT: 38.3 % (ref 34.8–46.6)
HGB: 12.6 g/dL (ref 11.6–15.9)
LYMPH%: 33.9 % (ref 14.0–49.7)
MCH: 29.6 pg (ref 25.1–34.0)
MCHC: 32.9 g/dL (ref 31.5–36.0)
MCV: 90 fL (ref 79.5–101.0)
MONO#: 0.6 10*3/uL (ref 0.1–0.9)
MONO%: 8.1 % (ref 0.0–14.0)
NEUT#: 4.3 10*3/uL (ref 1.5–6.5)
NEUT%: 54.1 % (ref 38.4–76.8)
Platelets: 262 10*3/uL (ref 145–400)
RBC: 4.26 10*6/uL (ref 3.70–5.45)
RDW: 16.8 % — ABNORMAL HIGH (ref 11.2–14.5)
WBC: 7.9 10*3/uL (ref 3.9–10.3)
lymph#: 2.7 10*3/uL (ref 0.9–3.3)

## 2010-03-03 LAB — COMPREHENSIVE METABOLIC PANEL
ALT: 11 U/L (ref 0–35)
AST: 11 U/L (ref 0–37)
Albumin: 4 g/dL (ref 3.5–5.2)
Alkaline Phosphatase: 97 U/L (ref 39–117)
BUN: 9 mg/dL (ref 6–23)
CO2: 27 mEq/L (ref 19–32)
Calcium: 9.3 mg/dL (ref 8.4–10.5)
Chloride: 106 mEq/L (ref 96–112)
Creatinine, Ser: 0.8 mg/dL (ref 0.40–1.20)
Glucose, Bld: 94 mg/dL (ref 70–99)
Potassium: 4 mEq/L (ref 3.5–5.3)
Sodium: 142 mEq/L (ref 135–145)
Total Bilirubin: 0.3 mg/dL (ref 0.3–1.2)
Total Protein: 6.6 g/dL (ref 6.0–8.3)

## 2010-03-03 LAB — VITAMIN D 25 HYDROXY (VIT D DEFICIENCY, FRACTURES): Vit D, 25-Hydroxy: 55 ng/mL (ref 30–89)

## 2010-03-03 LAB — CANCER ANTIGEN 27.29: CA 27.29: 26 U/mL (ref 0–39)

## 2010-03-03 LAB — LACTATE DEHYDROGENASE: LDH: 144 U/L (ref 94–250)

## 2010-05-12 ENCOUNTER — Other Ambulatory Visit: Payer: Self-pay | Admitting: Oncology

## 2010-05-12 DIAGNOSIS — C50919 Malignant neoplasm of unspecified site of unspecified female breast: Secondary | ICD-10-CM

## 2010-05-13 ENCOUNTER — Other Ambulatory Visit: Payer: Self-pay | Admitting: Oncology

## 2010-05-13 ENCOUNTER — Encounter: Payer: Self-pay | Admitting: Family Medicine

## 2010-05-13 DIAGNOSIS — Z9889 Other specified postprocedural states: Secondary | ICD-10-CM

## 2010-05-23 NOTE — Progress Notes (Signed)
  Phone Note Outgoing Call   Summary of Call: Needs ov with fasting labs--FLP, CBC, CMET  for more refills --pllease notify pts. Initial call taken by: Julieanne Manson MD,  October 26, 2008 6:44 PM  Follow-up for Phone Call        pt coming in for labs on Monday.Marland KitchenPhone call completed Follow-up by: Armenia Shannon,  October 28, 2008 12:20 PM

## 2010-05-23 NOTE — Letter (Signed)
Summary: influenza vaccination  influenza vaccination   Imported By: Arta Bruce 02/10/2007 14:11:26  _____________________________________________________________________  External Attachment:    Type:   Image     Comment:   External Document

## 2010-05-23 NOTE — Letter (Signed)
Summary: *HSN No Show Letter  [facility]  [address] [place], [place] [address]   Phone: [phone]  Fax: [phone]    11/13/2007 MRN: [id number]    Wendy Heath [address] [place], [place]  [address]   Dear  Ms. Wendy Heath,   Re: No Show Policy         At this time [facility] clinic does have a no show policy. We encourage all of our patients to call if they are unable to make their appointments. This letter is to inform you that our records show you  have 3 or more No Shows for the calendar year. We value you as a patient here at [facility] and want to continue to provide the best healthcare possible. In the future if you are unable to keep an appointment please contact our office ASAP to cancel or reschedule your appointment. An excessive amount of No Shows could lead to possible dismissal from the practice. If you have any questions please contact our office at [phone].     Thank you for your cooperation.    Sincerely,   Mikey College CMA [facility]     Appended Document: *HSN No Show Letter this is an error. Did not send out this letter.

## 2010-05-23 NOTE — Letter (Signed)
Summary: Discharge Summary  Discharge Summary   Imported By: Arta Bruce 02/07/2007 15:29:28  _____________________________________________________________________  External Attachment:    Type:   Image     Comment:   External Document

## 2010-05-23 NOTE — Medication Information (Signed)
Summary: PRIOR AUTHORIZATION/SILVERSCRIPT/ OMEPRAZOLE  PRIOR AUTHORIZATION/SILVERSCRIPT/ OMEPRAZOLE   Imported By: Arta Bruce 11/24/2007 16:13:56  _____________________________________________________________________  External Attachment:    Type:   Image     Comment:   External Document

## 2010-05-23 NOTE — Progress Notes (Signed)
Summary: NEEDS ALL SCRIPTS WRITTEN OUT  Phone Note Call from Patient   Summary of Call: Wendy Heath Pt. [name] is asking for all her scripts to be written out so she can come by and pick them up, because she is leaving this Sunday going to [place] and coming back around the middle of January 09. Initial call taken by: Leodis Rains,  March 18, 2007 9:34 AM  Follow-up for Phone Call        done.Please call patient to pick up as she needs WED. Follow-up by: Beverley Fiedler MD,  March 18, 2007 10:50 PM    Additional Follow-up for Phone Call Additional follow up Details #2::    PATIENT NOTIFIED AND SHES COMING BY TODAY. Follow-up by: Leodis Rains,  March 19, 2007 8:37 AM    Prescriptions: HYDROCODONE-ACETAMINOPHEN 5-500 MG  TABS (HYDROCODONE-ACETAMINOPHEN) Take one tablet every 4 hours as needed chest wall pain  #60 x 1   Entered and Authorized by:   Beverley Fiedler MD   Signed by:   Beverley Fiedler MD on 03/18/2007   Method used:   Print then Give to Patient   RxID:   [id number] OMEPRAZOLE 20 MG  TBEC (OMEPRAZOLE) take 2 tables every evening  #60 x 1   Entered and Authorized by:   Beverley Fiedler MD   Signed by:   Beverley Fiedler MD on 03/18/2007   Method used:   Print then Give to Patient   RxID:   [id number] ASPIR-LOW 81 MG TBEC (ASPIRIN) 1 by mouth qday  #30 x 1   Entered and Authorized by:   Beverley Fiedler MD   Signed by:   Beverley Fiedler MD on 03/18/2007   Method used:   Print then Give to Patient   RxID:   [id number] METOPROLOL TARTRATE 50 MG TABS (METOPROLOL TARTRATE) 1 by mouth qd  #30 x 1   Entered and Authorized by:   Beverley Fiedler MD   Signed by:   Beverley Fiedler MD on 03/18/2007   Method used:   Print then Give to Patient   RxID:   [id number] PLAVIX 75 MG TABS (CLOPIDOGREL BISULFATE) 1 by mouth qd  #30 x 1   Entered and Authorized by:   Beverley Fiedler MD   Signed by:   Beverley Fiedler MD on 03/18/2007  Method used:   Print then Give to Patient   RxID:   [id number] LISINOPRIL 10 MG TABS (LISINOPRIL) 1 by mouth once daily  #30 x 1   Entered and Authorized by:   Beverley Fiedler MD   Signed by:   Beverley Fiedler MD on 03/18/2007   Method used:   Print then Give to Patient   RxID:   [id number] CRESTOR 10 MG TABS (ROSUVASTATIN CALCIUM) 1 by mouth once daily  #30 x 1   Entered and Authorized by:   Beverley Fiedler MD   Signed by:   Beverley Fiedler MD on 03/18/2007   Method used:   Print then Give to Patient   RxID:   [id number] NORVASC 10 MG TABS (AMLODIPINE BESYLATE) 1 by mouth once daily  #30 x 1   Entered and Authorized by:   Beverley Fiedler MD   Signed by:   Beverley Fiedler MD on 03/18/2007   Method used:   Print then Give to Patient   RxID:   [id number]

## 2010-05-23 NOTE — Consult Note (Signed)
[facility]: [facility]   Imported By: Arta Bruce 02/07/2007 15:49:12  _____________________________________________________________________  External Attachment:    Type:   Image     Comment:   External Document

## 2010-05-23 NOTE — Progress Notes (Signed)
Summary: Switch omeprazole to pepcid.[name]  Phone Note Outgoing Call   Summary of Call: CMA to call patient: It is recommended that she switch to famotidine 40mg  two times a day rather than the omeprazole as she is also on plavix. Please notify Pt to stop the omeprazole. New script for famotidine to be sent to her pharmacy... Initial call taken by: Beverley Fiedler MD,  May 14, 2008 12:28 PM  Follow-up for Phone Call        pt informed of the change and will go pick up new Rx at pharmacy. Follow-up by: Levon Hedger,  May 14, 2008 5:07 PM  Additional Follow-up for Phone Call Additional follow up Details #1::        Pt was returning a phone called back.  I page but I couldn't reach anyone so please call her back .Marland KitchenManon Hilding  May 17, 2008 9:11 AM    Additional Follow-up for Phone Call Additional follow up Details #2::    rx telephone to [facility] [address]... phone note complete Follow-up by: Armenia Shannon,  May 17, 2008 12:05 PM  New/Updated Medications: FAMOTIDINE 40 MG TABS (FAMOTIDINE) Take 1 tablet by mouth every 12 hours   Prescriptions: FAMOTIDINE 40 MG TABS (FAMOTIDINE) Take 1 tablet by mouth every 12 hours  #60 x 5   Entered and Authorized by:   Beverley Fiedler MD   Signed by:   Beverley Fiedler MD on 05/14/2008   Method used:   Telephoned to ...         RxID:   [id number]

## 2010-05-23 NOTE — Progress Notes (Signed)
Summary: Pt didn't show to the app  Phone Note From Other Clinic   Summary of Call: [facility] called in to inform that the pt above didn't show up to her appointment today at 9;15 am with Dr Sharin Grave. if you have questions, you can call to  [phone].   Initial call taken by: [name],  April 08, 2009 10:20 AM  Follow-up for Phone Call        pt states they had a flat tire so she could not make appt. encouraged pt to always call the MD office if she has an appt. and is unable to keep appt. Some offices may not be willing to reschedule if you no show. Pt understood and she will call [facility] to reschedule. Follow-up by: Gaylyn Cheers RN,  April 11, 2009 10:43 AM

## 2010-05-23 NOTE — Progress Notes (Signed)
Summary: No longer our pt.  Phone Note Outgoing Call   Summary of Call: Do you want to refill her diclofenac --last seen 12/2008. Initial call taken by: Dutch Quint RN,  January 06, 2010 11:53 AM  Follow-up for Phone Call        no, pt needs to schedule an office visit [facility] so she needs to establish with new provider Follow-up by: Lehman Prom FNP,  January 06, 2010 3:03 PM  Additional Follow-up for Phone Call Additional follow up Details #1::        Rx denied -- Pt. states that she has established with another provider outside this practice. Additional Follow-up by: Dutch Quint RN,  January 06, 2010 3:12 PM

## 2010-05-23 NOTE — Letter (Signed)
[facility]   Imported By: Arta Bruce 03/07/2007 14:52:15  _____________________________________________________________________  External Attachment:    Type:   Image     Comment:   External Document  Appended Document: [facility] 9/08-11/08

## 2010-05-23 NOTE — Medication Information (Signed)
Summary: RX Folder/SILVERSCRIPT/OMEPRAZOLE  RX Folder/SILVERSCRIPT/OMEPRAZOLE   Imported By: Arta Bruce 11/24/2007 14:32:11  _____________________________________________________________________  External Attachment:    Type:   Image     Comment:   External Document

## 2010-05-23 NOTE — Letter (Signed)
Summary: cancer center/under treatment note  cancer center/under treatment note   Imported By: Arta Bruce 01/15/2007 11:37:58  _____________________________________________________________________  External Attachment:    Type:   Image     Comment:   External Document

## 2010-05-23 NOTE — Letter (Signed)
[facility]   Imported By: Arta Bruce 11/15/2008 11:13:19  _____________________________________________________________________  External Attachment:    Type:   Image     Comment:   External Document

## 2010-05-23 NOTE — Progress Notes (Signed)
Summary: Refills  Phone Note Call from Patient Call back at Home Phone [phone] Call back at [phone]   Caller: Patient Summary of Call: The pt said that the pharmacy already sent the request last week but she called the pharmacy but the medication still not there.  She needs more refills from her norvasc, crestor, plavix, metoprolol, clatrate, metrodanzole and famothadine. [name] [facility] [phone] Dr Barbaraann Barthel Initial call taken by: [name],  Aug 24, 2008 8:41 AM  Follow-up for Phone Call        [name] CAME BY AND SAYS THAT SHE NEEDS HER NORVASC AND THAT SHE HAS EVERYTHING ELSE. Follow-up by: Leodis Rains,  Aug 25, 2008 12:38 PM  Additional Follow-up for Phone Call Additional follow up Details #1::        the pt came yesterday but she got all the medications with the exception of norvasc.  [facility] [address] Marland KitchenManon Hilding  Aug 26, 2008 9:29 AM     Additional Follow-up for Phone Call Additional follow up Details #2::    forward to provider  Norvasc 30 each x 4 on 03-24-08 Follow-up by: Armenia Shannon,  Aug 26, 2008 9:59 AM    Prescriptions: NORVASC 10 MG TABS (AMLODIPINE BESYLATE) 1 by mouth once daily  #30 Each x 4   Entered and Authorized by:   Beverley Fiedler MD   Signed by:   Beverley Fiedler MD on 08/26/2008   Method used:   Electronically to        [facility] [phone]* (retail)       [address]       [place], [place]  [address]       Ph: [phone]       Fax: [phone]   RxID:   [id number]

## 2010-05-23 NOTE — Letter (Signed)
[facility]   Imported By: Arta Bruce 11/12/2008 11:41:04  _____________________________________________________________________  External Attachment:    Type:   Image     Comment:   External Document

## 2010-05-23 NOTE — Progress Notes (Signed)
  Phone Note Call from Patient Call back at Home Phone [phone]   Caller: Patient Summary of Call: Since last week, the pt feels that her arm is going numb and although I found her an office visit on April 7; she would like to know if she can be seen earlier. Dr Barbaraann Barthel Initial call taken by: [name],  July 12, 2008 9:11 AM  Follow-up for Phone Call        Pt c/o left arm/hand feeling numb that started last week.  It comes and goes.  Appt given for tomorrow with Dr Barbaraann Barthel. Follow-up by: Vesta Mixer CMA,  July 12, 2008 3:18 PM

## 2010-05-23 NOTE — Assessment & Plan Note (Signed)
Summary: left arm/hand numb feeling /tmm/Pt left before seeing MD.   Vital Signs:  Patient profile:   60 year old female Height:      61.25 inches Weight:      212 pounds BMI:     39.87 BSA:     1.94 Temp:     98.4 degrees F oral Pulse rate:   72 / minute Pulse rhythm:   regular Resp:     18 per minute BP sitting:   130 / 82  (left arm) Cuff size:   regular  Vitals Entered By: Armenia Shannon (July 13, 2008 3:00 PM) Is Patient Diabetic? No Pain Assessment Patient in pain? no       Does patient need assistance? Ambulation Normal   History of Present Illness: Pt checked in but left prior to seeing MD as had another appt per Kim,receptionist.  Allergies: No Known Drug Allergies   Complete Medication List: 1)  Norvasc 10 Mg Tabs (Amlodipine besylate) .[name].. 1 by mouth once daily 2)  Crestor 10 Mg Tabs (Rosuvastatin calcium) .[name].. 1 by mouth once daily 3)  Lisinopril 10 Mg Tabs (Lisinopril) .[name].. 1 tab by mouth daily 4)  Plavix 75 Mg Tabs (Clopidogrel bisulfate) .[name].. 1 by mouth qd 5)  Metoprolol Tartrate 50 Mg Tabs (Metoprolol tartrate) .[name].. 1 by mouth qd 6)  Aspir-low 81 Mg Tbec (Aspirin) .[name].. 1 by mouth qday 7)  Caltrate 600+d 600-400 Mg-unit Tabs (Calcium carbonate-vitamin d) .... Take one tablet po daily 8)  Metronidazole 500 Mg Tabs (Metronidazole) .... Take 1 tablet by mouth every 12 hours x 5 days. avoid all alcohol while taking this medicine. 9)  Famotidine 40 Mg Tabs (Famotidine) .... Take 1 tablet by mouth every 12 hours

## 2010-05-23 NOTE — Progress Notes (Signed)
Summary: no show for eligibility  no show for eligibility   Imported By: Paula Libra 03/04/2009 15:43:03  _____________________________________________________________________  External Attachment:    Type:   Image     Comment:   External Document

## 2010-05-23 NOTE — Progress Notes (Signed)
Summary: Needs office visit  Phone Note Outgoing Call   Summary of Call: Advise pt that her meds were refilled but she need to be seen in this office to establish with a new provider [facility] Initial call taken by: Lehman Prom FNP,  June 27, 2009 8:24 AM  Follow-up for Phone Call        PT ADVISED BUT WILL CALL BACK TO SCHEDULE FOLLOW UP. Follow-up by: Mikey College CMA,  June 27, 2009 9:05 AM

## 2010-05-23 NOTE — Assessment & Plan Note (Signed)
Summary: Bil knee pain   Vital Signs:  Patient profile:   60 year old female Weight:      211 pounds Temp:     97.8 degrees F oral Pulse rate:   59 / minute Pulse rhythm:   regular Resp:     16 per minute BP sitting:   124 / 81  (left arm) Cuff size:   regular  Vitals Entered By: Levon Hedger (December 28, 2008 10:18 AM) CC: pt states she can't sleep her legs ar hurting her alot, she says that her hands and feet are cold all the time, Lipid Management, Hypertension Management Is Patient Diabetic? No Pain Assessment Patient in pain? yes     Location: legs  Does patient need assistance? Functional Status Self care Ambulation Normal   Primary Care Provider:  [facility]  CC:  pt states she can't sleep her legs ar hurting her alot, she says that her hands and feet are cold all the time, Lipid Management, and Hypertension Management.  History of Present Illness:  Pt into the office with complaints of bil knee pain Both same in intensity Pain wakes her up during the night - woke this morning at 3AM and pain prevented her from sleeping. Currently pain present but not as bad as when she is sleeping. During the pain pain does get some better but still aches +Arthritis - Previous x-rays done in [place] in 2006  Social - employed at [employer] as a Location manager for 15 years  No medications today in office.  Advised pt to bring all meds with her to every visit.  Obesity - Pt has tried to lose weight as recommended by her oncologist  Hypertension History:      She denies headache, chest pain, and palpitations.  She notes no problems with any antihypertensive medication side effects.        Positive major cardiovascular risk factors include female age 3 years old or older, hyperlipidemia, hypertension, family history for ischemic heart disease (males less than 40 years old), and current tobacco user.  Negative major cardiovascular risk factors include no history of diabetes.          Further assessment for target organ damage reveals no history of ASHD, stroke/TIA, peripheral vascular disease, renal insufficiency, or hypertensive retinopathy.    Lipid Management History:      Positive NCEP/ATP III risk factors include female age 28 years old or older, family history for ischemic heart disease (males less than 45 years old), current tobacco user, and hypertension.  Negative NCEP/ATP III risk factors include no history of early menopause without estrogen hormone replacement, non-diabetic, no ASHD (atherosclerotic heart disease), no prior stroke/TIA, no peripheral vascular disease, and no history of aortic aneurysm.        The patient states that she knows about the "Therapeutic Lifestyle Change" diet.  Adjunctive measures started by the patient include ASA.  She expresses no side effects from her lipid-lowering medication.  The patient denies any symptoms to suggest myopathy or liver disease.      Habits & Providers  Alcohol-Tobacco-Diet     Tobacco Status: current     Tobacco Counseling: to quit use of tobacco products     Cigarette Packs/Day: <0.25     Year Started: 2008     Passive Smoke Exposure: no  Exercise-Depression-Behavior     Does Patient Exercise: no     Have you felt down or hopeless? no     Have you felt  little pleasure in things? yes     Drug Use: no     Seat Belt Use: 100     Sun Exposure: occasionally  Comments: Pt stopped smoking in 2008 with the dx of breast cancer. She restarted in 02/2007  Allergies (verified): No Known Drug Allergies  Social History: Smoking Status:  current Packs/Day:  <0.25  Review of Systems GI:  Complains of constipation. MS:  Complains of joint pain; bil knees.  Physical Exam  General:  alert.  obese Head:  normocephalic.   Ears:  ear piercing(s) noted.   Lungs:  normal breath sounds.   Heart:  normal rate and regular rhythm.   Abdomen:  normal bowel sounds.   Neurologic:  alert & oriented X3.      Knee Exam  General:    obese.    Knee Exam:    Right:    Inspection:  Abnormal    Palpation:  Abnormal       Location:  medial collateral    Stability:  stable    Tenderness:  medial collateral    Swelling:  no    Erythema:  no    Left:    Inspection:  Abnormal       Location:  medial collateral    Stability:  stable    Tenderness:  medial collateral    Swelling:  no    Erythema:  no   Impression & Recommendations:  Problem # 1:  KNEE PAIN, BILATERAL (ICD-719.46)  will order x-rays handout given advised continued exercise for weight loss Her updated medication list for this problem includes:    Aspir-low 81 Mg Tbec (Aspirin) .[name]... 1 by mouth qday    Diclofenac Sodium 75 Mg Tbec (Diclofenac sodium) ..... One tablet by mouth two times a day for knees (take with food)  Orders: Radiology other (Radiology Other)  Problem # 2:  CONSTIPATION (ICD-564.00) will order colonscopy need to increase fiber advised pt that she needs a colonscopy  Orders: Colonoscopy (Colon)  Problem # 3:  OBESITY (ICD-278.00) need to increase exercise and monitor calories  Complete Medication List: 1)  Norvasc 10 Mg Tabs (Amlodipine besylate) .[name].. 1 by mouth once daily 2)  Crestor 10 Mg Tabs (Rosuvastatin calcium) .[name].. 1 by mouth once daily 3)  Lisinopril 10 Mg Tabs (Lisinopril) .[name].. 1 tab by mouth daily 4)  Plavix 75 Mg Tabs (Clopidogrel bisulfate) .[name].. 1 by mouth qd 5)  Metoprolol Tartrate 50 Mg Tabs (Metoprolol tartrate) .[name].. 1 by mouth qd 6)  Aspir-low 81 Mg Tbec (Aspirin) .[name].. 1 by mouth qday 7)  Caltrate 600+d 600-400 Mg-unit Tabs (Calcium carbonate-vitamin d) .... Take one tablet po daily 8)  Famotidine 40 Mg Tabs (Famotidine) .... Take 1 tablet by mouth every 12 hours 9)  Diclofenac Sodium 75 Mg Tbec (Diclofenac sodium) .... One tablet by mouth two times a day for knees (take with food)  Other Orders: UA Dipstick w/o Micro (manual) (16109) T-Urine Microalbumin w/creat. ratio  [phone])  Hypertension Assessment/Plan:      The patient's hypertensive risk group is category B: At least one risk factor (excluding diabetes) with no target organ damage.  Her calculated 10 year risk of coronary heart disease is 13 %.  Today's blood pressure is 124/81.  Her blood pressure goal is < 140/90.  Lipid Assessment/Plan:      Based on NCEP/ATP III, the patient's risk factor category is "2 or more risk factors and a calculated 10 year CAD risk of < 20%".  The patient's lipid goals are as follows: Total cholesterol goal is 200; LDL cholesterol goal is 130; HDL cholesterol goal is 40; Triglyceride goal is 150.    Patient Instructions: 1)  Get the knee x-rays as ordered 2)  You likely have moderate arthritis - read handout 3)  Take diclofenac 75mg  by mouth two times a day (with food) for inflammation 4)  You could also get some knee supports. 5)  You need a colonscopy.  This is a screening test used for colon cancer. I would especially recommend this given your history of constipation. Add more fiber to your diet. Prescriptions: DICLOFENAC SODIUM 75 MG TBEC (DICLOFENAC SODIUM) One tablet by mouth two times a day for knees (take with food)  #60 x 3   Entered and Authorized by:   Lehman Prom FNP   Signed by:   Lehman Prom FNP on 12/28/2008   Method used:   Electronically to        [facility] [phone]* (retail)       [address]       [place], [place]  [address]       Ph: [phone]       Fax: [phone]   RxID:   [id number]   Appended Document: Bil knee pain     Allergies: No Known Drug Allergies   Complete Medication List: 1)  Norvasc 10 Mg Tabs (Amlodipine besylate) .[name].. 1 by mouth once daily 2)  Crestor 10 Mg Tabs (Rosuvastatin calcium) .[name].. 1 by mouth once daily 3)  Lisinopril 10 Mg Tabs (Lisinopril) .[name].. 1 tab by mouth daily 4)  Plavix 75 Mg Tabs (Clopidogrel bisulfate) .[name].. 1 by mouth qd 5)  Metoprolol Tartrate 50 Mg Tabs  (Metoprolol tartrate) .[name].. 1 by mouth qd 6)  Aspir-low 81 Mg Tbec (Aspirin) .[name].. 1 by mouth qday 7)  Caltrate 600+d 600-400 Mg-unit Tabs (Calcium carbonate-vitamin d) .... Take one tablet po daily 8)  Famotidine 40 Mg Tabs (Famotidine) .... Take 1 tablet by mouth every 12 hours 9)  Diclofenac Sodium 75 Mg Tbec (Diclofenac sodium) .... One tablet by mouth two times a day for knees (take with food)   Laboratory Results   Urine Tests  Date/Time Received: December 28, 2008 11:35 AM  Date/Time Reported: December 28, 2008 11:35 AM   Routine Urinalysis   Color: yellow Appearance: Clear Glucose: negative   (Normal Range: Negative) Bilirubin: negative   (Normal Range: Negative) Ketone: trace (5)   (Normal Range: Negative) Spec. Gravity: >=1.030   (Normal Range: 1.003-1.035) Blood: trace-lysed   (Normal Range: Negative) pH: 5.5   (Normal Range: 5.0-8.0) Protein: negative   (Normal Range: Negative) Urobilinogen: 0.2   (Normal Range: 0-1) Nitrite: negative   (Normal Range: Negative) Leukocyte Esterace: negative   (Normal Range: Negative)

## 2010-05-23 NOTE — Assessment & Plan Note (Signed)
Summary: FLU SHOT///KT  Nurse Visit    Prior Medications: NORVASC 10 MG TABS (AMLODIPINE BESYLATE) 1 by mouth once daily CRESTOR 10 MG TABS (ROSUVASTATIN CALCIUM) 1 by mouth once daily LISINOPRIL 10 MG  TABS (LISINOPRIL) 1 tab by mouth daily PLAVIX 75 MG TABS (CLOPIDOGREL BISULFATE) 1 by mouth qd METOPROLOL TARTRATE 50 MG TABS (METOPROLOL TARTRATE) 1 by mouth qd ASPIR-LOW 81 MG TBEC (ASPIRIN) 1 by mouth qday OMEPRAZOLE 20 MG  TBEC (OMEPRAZOLE) take 2 tables every evening CALTRATE 600+D 600-400 MG-UNIT TABS (CALCIUM CARBONATE-VITAMIN D) take one tablet po daily METRONIDAZOLE 500 MG TABS (METRONIDAZOLE) Take 1 tablet by mouth every 12 hours x 5 days. Avoid all alcohol while taking this medicine. Current Allergies: No known allergies    Influenza Vaccine    Vaccine Type: Fluvax MCR    Site: right deltoid    Mfr: Sanofi Pasteur    Dose: 0.5 ml    Route: IM    Given by: Gaylyn Cheers RN    Exp. Date: 10/20/2008    Lot #: [id number]    VIS given: 11/14/06 version given January 28, 2008.  Flu Vaccine Consent Questions    Do you have a history of severe allergic reactions to this vaccine? no    Any prior history of allergic reactions to egg and/or gelatin? no    Do you have a sensitivity to the preservative Thimersol? no    Do you have a past history of Guillan-Barre Syndrome? no    Do you currently have an acute febrile illness? no    Have you ever had a severe reaction to latex? no    Vaccine information given and explained to patient? yes    Are you currently pregnant? no   Orders Added: 1)  Influenza Vaccine MCR [00025] 2)  Est. Patient Nurse visit [09003] 3)  Admin of Therapeutic Inj  intramuscular or subcutaneous Lepidus.Putnam    ]

## 2010-05-23 NOTE — Progress Notes (Signed)
Summary: office visit  Phone Note Call from Patient Call back at Home Phone [phone]   Caller: Patient Summary of Call: The patient was at the hospital since last thursday (oct 9) thru Sunday for chest pain.  She needs a hospital follow appoitment with her physician.  She can come after 2 pm. Dr Barbaraann Barthel Initial call taken by: [name],  February 04, 2007 11:25 AM  Follow-up for Phone Call        Please schedule appt. Thank you Follow-up by: Gaylyn Cheers RN,  February 04, 2007 1:34 PM  Additional Follow-up for Phone Call Additional follow up Details #1::        I offered an app on Oct 28, after 2:00 pm but she hang up on me. Additional Follow-up by: [name],  February 05, 2007 8:24 AM

## 2010-05-23 NOTE — Letter (Signed)
Summary: [facility]/OFFICE [facility]GRESS NOTES  [facility] NOT[facility]   Imported By: Arta Bruce 11/21/2007 15:35:05  _____________________________________________________________________  External Attachment:    Type:   Image     Comment:   External Document

## 2010-05-23 NOTE — Assessment & Plan Note (Signed)
Summary: F/U PER PT/////RJP   Vital Signs:  Patient Profile:   60 Years Old Female Height:     61.25 inches Weight:      204 pounds BMI:     38.37 BSA:     1.91 Temp:     97 degrees F oral Pulse rate:   60 / minute Pulse rhythm:   regular Resp:     18 per minute BP sitting:   130 / 90  (right arm) Cuff size:   regular  Pt. in pain?   no  Vitals Entered By: Gaylyn Cheers RN (January 06, 2008 11:34 AM)              Is Patient Diabetic? No  Does patient need assistance? Functional Status Self care Ambulation Normal     PCP:  Toshua Honsinger  Chief Complaint:  brownish vag. d/c and hot flashes.  History of Present Illness: Here for vaginal d/c x 4 months. Last had sex  ~ 6/09. No dysuria,no hematuria.    Prior Medications Reviewed Using: Medication Bottles  Updated Prior Medication List: NORVASC 10 MG TABS (AMLODIPINE BESYLATE) 1 by mouth once daily CRESTOR 10 MG TABS (ROSUVASTATIN CALCIUM) 1 by mouth once daily LISINOPRIL 10 MG  TABS (LISINOPRIL) 1 tab by mouth daily PLAVIX 75 MG TABS (CLOPIDOGREL BISULFATE) 1 by mouth qd METOPROLOL TARTRATE 50 MG TABS (METOPROLOL TARTRATE) 1 by mouth qd ASPIR-LOW 81 MG TBEC (ASPIRIN) 1 by mouth qday OMEPRAZOLE 20 MG  TBEC (OMEPRAZOLE) take 2 tables every evening CALTRATE 600+D 600-400 MG-UNIT TABS (CALCIUM CARBONATE-VITAMIN D) take one tablet po daily  Current Allergies: No known allergies   Past Medical History:    Reviewed history from 08/24/2006 and no changes required:       Breast cancer, hx of       GERD       Hyperlipidemia       Hypertension       Cerebrovascular accident, hx of  Past Surgical History:    Reviewed history from 08/24/2006 and no changes required:       Appendectomy       Hysterectomy      Physical Exam  General:     Well-developed,well-nourished,in no acute distress; alert,appropriate and cooperative throughout examination No other exam except wet prep. Genitalia:     Wet Prep:,no  clues,no yeast: Many trich    Impression & Recommendations:  Problem # 1:  VAGINAL TRICHOMONIASIS (ICD-131.01) Flagyl 500mg  Take 1 tablet by mouth every 12 hours x 5 days. Partner needs treatment. Avoid all ETOH while taking flagyl.  Problem # 2:  MICROSCOPIC HEMATURIA (ICD-599.72) No other abx given.Possibly due to trich.  Orders: T-Culture, Urine ([id number])  Her updated medication list for this problem includes:    Metronidazole 500 Mg Tabs (Metronidazole) .[name]... Take 1 tablet by mouth every 12 hours x 5 days. avoid all alcohol while taking this medicine.   Complete Medication List: 1)  Norvasc 10 Mg Tabs (Amlodipine besylate) .[name].. 1 by mouth once daily 2)  Crestor 10 Mg Tabs (Rosuvastatin calcium) .[name].. 1 by mouth once daily 3)  Lisinopril 10 Mg Tabs (Lisinopril) .[name].. 1 tab by mouth daily 4)  Plavix 75 Mg Tabs (Clopidogrel bisulfate) .[name].. 1 by mouth qd 5)  Metoprolol Tartrate 50 Mg Tabs (Metoprolol tartrate) .[name].. 1 by mouth qd 6)  Aspir-low 81 Mg Tbec (Aspirin) .[name].. 1 by mouth qday 7)  Omeprazole 20 Mg Tbec (Omeprazole) .... Take 2 tables every evening 8)  Caltrate 600+d  600-400 Mg-unit Tabs (Calcium carbonate-vitamin d) .... Take one tablet po daily 9)  Metronidazole 500 Mg Tabs (Metronidazole) .... Take 1 tablet by mouth every 12 hours x 5 days. avoid all alcohol while taking this medicine.  Other Orders: KOH/ WET [place] [phone]) UA Dipstick w/o Micro (automated)  (81003)   Patient Instructions: 1)  You have trichomonas.Your partner needs to be treated. 2)  If you could be exposed to sexually transmitted diseases, you should use a condom.   Prescriptions: METRONIDAZOLE 500 MG TABS (METRONIDAZOLE) Take 1 tablet by mouth every 12 hours x 5 days. Avoid all alcohol while taking this medicine.  #10 x 0   Entered and Authorized by:   Beverley Fiedler MD   Signed by:   Beverley Fiedler MD on 01/06/2008   Method used:   Print then Give to Patient   RxID:    [id number]  ] Laboratory Results   Urine Tests  Date/Time Received: January 06, 2008 12:06 PM  Date/Time Reported: January 06, 2008 12:06 PM   Routine Urinalysis   Color: yellow Glucose: negative   (Normal Range: Negative) Bilirubin: negative   (Normal Range: Negative) Ketone: negative   (Normal Range: Negative) Spec. Gravity: >=1.030   (Normal Range: 1.003-1.035) Blood: small   (Normal Range: Negative) pH: 5.0   (Normal Range: 5.0-8.0) Protein: negative   (Normal Range: Negative) Urobilinogen: 0.2   (Normal Range: 0-1) Nitrite: negative   (Normal Range: Negative) Leukocyte Esterace: small   (Normal Range: Negative)

## 2010-05-23 NOTE — Letter (Signed)
Summary: cancer center/ office note  cancer center/ office note   Imported By: Arta Bruce 02/07/2007 10:29:39  _____________________________________________________________________  External Attachment:    Type:   Image     Comment:   External Document

## 2010-05-23 NOTE — Letter (Signed)
[facility]   Imported By: Arta Bruce 06/04/2007 15:42:29  _____________________________________________________________________  External Attachment:    Type:   Image     Comment:   External Document

## 2010-05-23 NOTE — Progress Notes (Signed)
  Phone Note Outgoing Call   Summary of Call: Is patient still seen here? Rec'd refill request for norvasc. WIll fill x 2 mos. Needs f/u appt if still patient here. O/w needs to get refills with new PCP. Initial call taken by: Tereso Newcomer PA-C,  June 01, 2009 8:05 AM  Follow-up for Phone Call        spoke pt and she said she has new insurance and so she is not able to come here...Marland KitchenMarland Kitchen pt says she is looking for a new pcp now.  discuss with Ahava Kissoon..[place] Shannon  June 02, 2009 9:00 AM     Prescriptions: NORVASC 10 MG TABS (AMLODIPINE BESYLATE) 1 by mouth once daily  #30 Each x 1   Entered and Authorized by:   Tereso Newcomer PA-C   Signed by:   Tereso Newcomer PA-C on 06/01/2009   Method used:   Electronically to        [facility] [phone]* (retail)       [address]       [place], [place]  [address]       Ph: [phone]       Fax: [phone]   RxID:   [id number]

## 2010-05-23 NOTE — Assessment & Plan Note (Signed)
Summary: 5 DAY HOSP FU DUE TO CHEST PAINS//KT   Vital Signs:  Patient Profile:   60 Years Old Female Height:     61.25 inches Weight:      182 pounds BMI:     34.23 BSA:     1.82 Temp:     97.6 degrees F oral Pulse rate:   72 / minute Pulse rhythm:   regular BP sitting:   132 / 80  (right arm) Cuff size:   regular  Pt. in pain?   no  Vitals Entered By: Gaylyn Cheers RN (February 10, 2007 12:14 PM)              Is Patient Diabetic? No  Does patient need assistance? Ambulation Normal     PCP:  Joyleen Haselton  Chief Complaint:  hospital F/U.  History of Present Illness: Had radiation therapy today and has f/u with Dr.Kinard 02/13/07 and Dr.Tsui on 02/12/07. Doing well since hospital discharge. Extensive correspondence and recent D/C summary reviewed: #1 Left-sided breast cancer (locally-invasive Ductal CA). Dx'd [id number]. Pt is s/p chemotherapy followed by left partial mastectomy(11/25/06) by Dr. Corliss Skains. Started radiation 01/09/07. #2 Hospitalized with atypical chest pain and T wave inversions 01/30/07-02/02/07. Pt r/o for MI, Ct neg for PE, and stress myoview negative for ischemia. A 2D echo showed nl EF of 65%, no ventricular wall motion abnormalities, and mild aortic valve thickening. Pt seen in hospital consult by Dr. Algie Coffer.   Pt still has twinges of chest-pain especially with deep inspiration and when lying on her left side. Uses vicodin for pain.    Current Allergies: No known allergies   Past Medical History:    Reviewed history from 08/24/2006 and no changes required:       Breast cancer, hx of       GERD       Hyperlipidemia       Hypertension       Cerebrovascular accident, hx of  Past Surgical History:    Reviewed history from 08/24/2006 and no changes required:       Appendectomy       Hysterectomy    Risk Factors:  Passive smoke exposure:  no Drug use:  no HIV high-risk behavior:  no Caffeine use:  1 drinks per day Alcohol use:  no Exercise:   no Seatbelt use:  100 % Sun Exposure:  occasionally  Family History Risk Factors:    Family History of MI in males < 75 years old:  yes   Review of Systems  The patient denies fever, syncope, dyspnea on exhertion, peripheral edema, and prolonged cough.         Denies nausea,vomiting,diarrhea. Has some constipation. Still has twinges of the chest pain;is positional,especially when lying on left side.    Physical Exam  General:     Well-developed,well-nourished,in no acute distress; alert,appropriate and cooperative throughout examination Lungs:     Normal respiratory effort, chest expands symmetrically. Lungs are clear to auscultation, no crackles or wheezes. Heart:     Normal rate and regular rhythm. S1 and S2 normal without gallop, murmur, click, rub or other extra sounds.    Impression & Recommendations:  Problem # 1:  CHEST PAIN, ATYPICAL, HX OF (ICD-V15.89) Use vicodin  as needed.  Problem # 2:  HYPERTENSION (ICD-401.9) Controlled The following medications were removed from the medication list:    Norvasc 10 Mg Tabs (Amlodipine besylate) .[name]... 1po qd  Her updated medication list for this problem includes:    Norvasc  10 Mg Tabs (Amlodipine besylate) .[name]... 1 by mouth once daily    Metoprolol Tartrate 50 Mg Tabs (Metoprolol tartrate) .[name]... 1 by mouth qd   Problem # 3:  BREAST CANCER, HX OF (ICD-V10.3) Cont f/u as scheduled with surgeon and oncologist. Flu shot given today.  Complete Medication List: 1)  Norvasc 10 Mg Tabs (Amlodipine besylate) .[name].. 1 by mouth once daily 2)  Crestor 10 Mg Tabs (Rosuvastatin calcium) .[name].. 1 by mouth once daily 3)  Lisinopril 10 Mg Tabs (lisinopril)  .[name].. 1 by mouth once daily 4)  Plavix 75 Mg Tabs (Clopidogrel bisulfate) .[name].. 1 by mouth qd 5)  Metoprolol Tartrate 50 Mg Tabs (Metoprolol tartrate) .[name].. 1 by mouth qd 6)  Aspir-low 81 Mg Tbec (Aspirin) .[name].. 1 by mouth qday 7)  Omeprazole 20 Mg Tbec (Omeprazole) .... Take 2 tables every  evening 8)  Hydrocodone-acetaminophen 5-500 Mg Tabs (Hydrocodone-acetaminophen) .... Take one tablet every 4 hours as needed chest wall pain  Other Orders: Flu Vaccine 47yrs + (84132) Admin 1st Vaccine (44010)   Patient Instructions: 1)  Please schedule a follow-up appointment as needed.    ]  Influenza Vaccine    Vaccine Type: Fluvax 3+    Site: left deltoid    Mfr: Sanofi Pasteur    Dose: 0.5 ml    Route: IM    Given by: Gaylyn Cheers RN    Exp. Date: 10/21/2007    Lot #: [id number]    VIS given: 10/20/04 version given February 10, 2007.  Flu Vaccine Consent Questions    Do you have a history of severe allergic reactions to this vaccine? no    Any prior history of allergic reactions to egg and/or gelatin? no    Do you have a sensitivity to the preservative Thimersol? no    Do you have a past history of Guillan-Barre Syndrome? no    Do you currently have an acute febrile illness? no    Have you ever had a severe reaction to latex? no    Vaccine information given and explained to patient? yes    Are you currently pregnant? no

## 2010-05-23 NOTE — Letter (Signed)
Summary: Lipid Letter  [facility]  [address] [place], [place] [address]   Phone: [phone]  Fax: [phone]    11/02/2008  Wendy Heath [address] [place], [place]  [address]  Dear Wendy Heath:  We have carefully reviewed your last lipid profile from 11/01/2008 and the results are noted below with a summary of recommendations for lipid management.    Cholesterol:       126     Goal: less than 200   HDL "good" Cholesterol:   40     Goal: greater than 40   LDL "bad" Cholesterol:   68     Goal: less than 100   Triglycerides:       90     Goal: less than 150    Labs done during recent office visit ok. Continue cholesterol medications at current dose.      Current Medications: 1)    Norvasc 10 Mg Tabs (Amlodipine besylate) .[name].. 1 by mouth once daily 2)    Crestor 10 Mg Tabs (Rosuvastatin calcium) .[name].. 1 by mouth once daily 3)    Lisinopril 10 Mg  Tabs (Lisinopril) .[name].. 1 tab by mouth daily 4)    Plavix 75 Mg Tabs (Clopidogrel bisulfate) .[name].. 1 by mouth qd 5)    Metoprolol Tartrate 50 Mg Tabs (Metoprolol tartrate) .[name].. 1 by mouth qd 6)    Aspir-low 81 Mg Tbec (Aspirin) .[name].. 1 by mouth qday 7)    Caltrate 600+d 600-400 Mg-unit Tabs (Calcium carbonate-vitamin d) .... Take one tablet po daily 8)    Famotidine 40 Mg Tabs (Famotidine) .... Take 1 tablet by mouth every 12 hours  If you have any questions, please call. We appreciate being able to work with you.   Sincerely,    [facility], FNP

## 2010-06-22 ENCOUNTER — Other Ambulatory Visit (HOSPITAL_COMMUNITY)
Admission: RE | Admit: 2010-06-22 | Discharge: 2010-06-22 | Disposition: A | Discharge status: Home / Self Care | Payer: 59 | Source: Ambulatory Visit | Attending: Family Medicine | Admitting: Family Medicine

## 2010-06-22 ENCOUNTER — Other Ambulatory Visit: Payer: Self-pay | Admitting: Family Medicine

## 2010-06-22 DIAGNOSIS — Z01419 Encounter for gynecological examination (general) (routine) without abnormal findings: Secondary | ICD-10-CM | POA: Insufficient documentation

## 2010-06-22 DIAGNOSIS — Z1159 Encounter for screening for other viral diseases: Secondary | ICD-10-CM | POA: Insufficient documentation

## 2010-07-18 ENCOUNTER — Emergency Department (HOSPITAL_COMMUNITY): Payer: Medicare HMO

## 2010-07-18 ENCOUNTER — Inpatient Hospital Stay (HOSPITAL_COMMUNITY)
Admission: EM | Admit: 2010-07-18 | Discharge: 2010-07-21 | DRG: 392 | Disposition: A | Discharge status: Home / Self Care | Payer: Medicare HMO | Attending: Internal Medicine | Admitting: Internal Medicine

## 2010-07-18 DIAGNOSIS — I10 Essential (primary) hypertension: Secondary | ICD-10-CM | POA: Diagnosis present

## 2010-07-18 DIAGNOSIS — K5732 Diverticulitis of large intestine without perforation or abscess without bleeding: Principal | ICD-10-CM | POA: Diagnosis present

## 2010-07-18 DIAGNOSIS — D72829 Elevated white blood cell count, unspecified: Secondary | ICD-10-CM | POA: Diagnosis present

## 2010-07-18 DIAGNOSIS — Z8673 Personal history of transient ischemic attack (TIA), and cerebral infarction without residual deficits: Secondary | ICD-10-CM

## 2010-07-18 DIAGNOSIS — Z853 Personal history of malignant neoplasm of breast: Secondary | ICD-10-CM

## 2010-07-18 DIAGNOSIS — E119 Type 2 diabetes mellitus without complications: Secondary | ICD-10-CM | POA: Diagnosis present

## 2010-07-18 LAB — URINALYSIS, ROUTINE W REFLEX MICROSCOPIC
Bilirubin Urine: NEGATIVE
Glucose, UA: NEGATIVE mg/dL
Ketones, ur: NEGATIVE mg/dL
Leukocytes, UA: NEGATIVE
Nitrite: NEGATIVE
Protein, ur: NEGATIVE mg/dL
Specific Gravity, Urine: 1.011 (ref 1.005–1.030)
Urobilinogen, UA: 0.2 mg/dL (ref 0.0–1.0)
pH: 6 (ref 5.0–8.0)

## 2010-07-18 LAB — CBC
HCT: 44.2 % (ref 36.0–46.0)
Hemoglobin: 14.4 g/dL (ref 12.0–15.0)
MCH: 29.3 pg (ref 26.0–34.0)
MCHC: 32.6 g/dL (ref 30.0–36.0)
MCV: 89.8 fL (ref 78.0–100.0)
Platelets: 274 10*3/uL (ref 150–400)
RBC: 4.92 MIL/uL (ref 3.87–5.11)
RDW: 15.7 % — ABNORMAL HIGH (ref 11.5–15.5)
WBC: 18.9 10*3/uL — ABNORMAL HIGH (ref 4.0–10.5)

## 2010-07-18 LAB — COMPREHENSIVE METABOLIC PANEL
ALT: 18 U/L (ref 0–35)
AST: 31 U/L (ref 0–37)
Albumin: 4.2 g/dL (ref 3.5–5.2)
Alkaline Phosphatase: 107 U/L (ref 39–117)
BUN: 7 mg/dL (ref 6–23)
CO2: 24 mEq/L (ref 19–32)
Calcium: 9.6 mg/dL (ref 8.4–10.5)
Chloride: 101 mEq/L (ref 96–112)
Creatinine, Ser: 0.85 mg/dL (ref 0.4–1.2)
GFR calc Af Amer: >60 mL/min (ref >60)
GFR calc non Af Amer: >60 mL/min (ref >60)
Glucose, Bld: 165 mg/dL — ABNORMAL HIGH (ref 70–99)
Potassium: 4 mEq/L (ref 3.5–5.1)
Sodium: 138 mEq/L (ref 135–145)
Total Bilirubin: 1.4 mg/dL — ABNORMAL HIGH (ref 0.3–1.2)
Total Protein: 8.1 g/dL (ref 6.0–8.3)

## 2010-07-18 LAB — DIFFERENTIAL
Basophils Absolute: 0 10*3/uL (ref 0.0–0.1)
Basophils Relative: 0 % (ref 0–1)
Eosinophils Absolute: 0 10*3/uL (ref 0.0–0.7)
Eosinophils Relative: 0 % (ref 0–5)
Lymphocytes Relative: 6 % — ABNORMAL LOW (ref 12–46)
Lymphs Abs: 1.2 10*3/uL (ref 0.7–4.0)
Monocytes Absolute: 0.7 10*3/uL (ref 0.1–1.0)
Monocytes Relative: 4 % (ref 3–12)
Neutro Abs: 17 10*3/uL — ABNORMAL HIGH (ref 1.7–7.7)
Neutrophils Relative %: 90 % — ABNORMAL HIGH (ref 43–77)

## 2010-07-18 LAB — URINE MICROSCOPIC-ADD ON

## 2010-07-18 NOTE — H&P (Addendum)
[name], [name]             ACCOUNT NO.:  [id number]  MEDICAL RECORD NO.:  [id number]           PATIENT TYPE:  E  LOCATION:  [facility]                         FACILITY:  [facility]  PHYSICIAN:  Thad Ranger, MD       DATE OF BIRTH:  10-03-1950  DATE OF ADMISSION:  07/18/2010 DATE OF DISCHARGE:                             HISTORY & PHYSICAL   PRIMARY CARE PROVIDER:  Renaye Heath, M.D.  The patient is being admitted to [facility].  CHIEF COMPLAINTS:  Nausea/vomiting/abdominal pain.  HISTORY OF PRESENT ILLNESS:  Ms. Wendy Heath is a very pleasant 60 year old female with a history of hypertension, stroke, breast cancer who presents to the [facility] ED with a chief complaint of abdominal pain. Information is obtained from the patient.  She reports that about 4:00 a.m. this morning, she developed sudden right lower quadrant pain.  She indicates that the pain was constant, however, its intensity varied somewhat.  She describes the pain as sharp and nothing makes it better or worse.  She also developed diarrhea which she remembers three episodes during the night.  She subsequently developed nausea and vomiting multiple times through the night.  The patient denies coffee- ground emesis or dark stool or bright red blood per rectum.  Associated symptoms do include subjective fever, chills.  The patient reports feeling constipated yesterday and eating Raisinet as treatment.  She denies any chest pain, palpitations, shortness of breath or cough. Workup in the ED yields an abdominal CT concerning for diverticulitis and a white count of 19.8.  We are asked to admit for further evaluation and treatment.  Symptoms came on suddenly, have persisted and worsened, are characterized as moderate to severe.  ALLERGIES:  No known drug allergies.  PAST MEDICAL HISTORY: 1. Breast cancer status post lumpectomy, chemotherapy and radiation in     2008. 2. Hypertension. 3.  Hyperlipidemia. 4. Stroke. 5. History of pyelonephritis.  PAST SURGICAL HISTORY: 1. Hysterectomy. 2. Appendectomy. 3. Lumpectomy.  FAMILY MEDICAL HISTORY:  The patient's mother died at 71 from breast cancer.  The patient's father died at 75 from an acute MI.  She has one brother who died at 2 from an acute MI.  She has a sister with breast cancer.  SOCIAL HISTORY:  The patient is divorced.  She smokes about six cigarettes a day but is trying to quit.  She has an occasional glass of wine.  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  MEDICATIONS: 1. Metformin 500 mg p.o. 1 tablet daily 2. Plavix 75 mg p.o. 1 tablet daily. 3. Vitamin D3 1000 units p.o. 1 tablet daily. 4. Crestor 10 mg p.o. daily 5. Lisinopril 10 mg p.o. daily. 6. Famotidine 40 mg p.o. 1 tablet b.i.d. 7. Calcium 600 mg p.o. daily. 8. Amlodipine 10 mg p.o. daily. 9. Metoprolol tartrate 50 mg p.o. 1 tablet daily 10.Aspirin enteric-coated 81 mg p.o. daily.  REVIEW OF SYSTEMS:  GENERAL:  Positive for subjective fever, chills. Negative for anorexia, unintentional weight loss.  ENT:  Negative for ear pain, nasal congestion, sore throat.  Positive for some seasonal  allergic watery eyes.  CV:  Negative for chest pain, palpitation. Positive for some trace lower extremity edema.  RESPIRATORY:  Negative for shortness of breath or cough.  MUSCULOSKELETAL:  Negative for joint pain, muscle weakness.  NEURO:  Negative for headache, visual disturbances, numbness, tingling of extremities, photophobia.  GI: See HPI.  GU: Negative for dysuria, hematuria, frequency or urgency.  PSYCH: Negative for depression, anxiety.  HEME:  Negative for any usual bruising or bleeding.  LABORATORY DATA:  WBC 18.9, hemoglobin 14.4, hematocrit 44.2, platelets 274, neutrophils 90%, absolute neutrophils 17.0.  Sodium 138, potassium 4.0, chloride 101, CO2 of 24, BUN 7, creatinine 0.85, glucose 165, total bili 1.4.  Urinalysis  yields small blood, few bacteria, 3 to 6 RBCs.  RADIOLOGICAL DATA:  CT of the abdomen and pelvis yields mild acute diverticulitis of the distal descending colon.  No evidence of abscess or pneumoperitoneum.  No evidence of bowel obstruction.  PHYSICAL EXAMINATION:  VITAL SIGNS:  Temperature 97.8, blood pressure 121/76, heart rate 78, respiratory rate 16, sat 100% on 2 L nasal cannula. GENERAL:  Awake, alert, well nourished, somewhat uncomfortable appearing HEENT:  Head is normocephalic, atraumatic.  EOMI.  Mucous membranes of her mouth are slightly pale, slightly dry.  No obvious lesion or exudate in her nose or ears. NECK:  Supple.  No JVD.  Full range of motion.  No lymphadenopathy. CV:  Regular rate and rhythm.  No murmur, gallop or rub.  Trace lower extremity edema.  Pedal pulses present and palpable. RESPIRATORY:  No increased work of breathing.  Breath sounds clear to auscultation bilaterally.  No rhonchi, wheezes or rales. ABDOMEN:  Obese but soft.  Positive bowel sounds but very diminished particularly in the right lower quadrant.  Diffuse tenderness to gentle palpation particularly in the right lower quadrant and right flank area. No mass or organomegaly noted. NEURO:  Alert and oriented x3.  Cranial nerves II through XII grossly intact.  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 MUSCULOSKELETAL:  Moves all extremities.  No joint swelling/erythema. Nontender to palpation.  Full range of motion.  EXTREMITIES:  Without clubbing or cyanosis.  ASSESSMENT AND PLAN: 1. Diverticulitis per CT.  We will admit the patient to regular bed.     We will place on clear liquids, gentle IV hydration.  We will     provide Flagyl and Rocephin I.V (patient allergic to ciprofloxacin).  We will check stool for O and P, C     diff culture as well as occult blood.  We will provide Zofran for     nausea and Dilaudid for pain.  When the acute phase is over, the     patient will need a GI  followup probably on outpatient basis and     colonoscopy. 2. Leukocytosis secondary to diverticulitis presumably.  We will give     antibiotics per diverticulitis.  The patient is currently afebrile.     No clinical indication of need for chest x-ray.  UA is     unremarkable.  We will monitor.  We will check CBC with diff in the     a.m. 3. Diabetes, newly diagnosed.  The patient indicates Dr. Parke Simmers     concerned as the patient is "borderline."  We will check hemoglobin     A1c.  We will hold metformin for now.  We will check CBGs and use     sliding scale glycemic control. 4. Hypertension.  The patient's blood pressure currently 121/76.  We     will continue her lisinopril, amlodipine, and beta blocker with     parameters.  We will monitor closely. 5. History of stroke.  Continue her aspirin and Plavix. 6. History of breast cancer.  Currently at baseline. 7. Hyperlipidemia.  We will check a fasting lipid panel and continue     her statin. 8. DVT prophylaxis.  Will use SCDs. 9. Code status.  The patient is full code.  It was truly a pleasure taking care of Ms. Wendy Heath.     Gwenyth Bender, NP   ______________________________ Thad Ranger, MD    KMB/MEDQ  D:  07/18/2010  T:  07/18/2010  Job:  [id number]  cc:   Wendy Heath, M.D. Fax: [phone]  Electronically Signed by Andres Labrum RAI  on 07/18/2010 03:53:17 PM Electronically Signed by Toya Smothers  on 07/21/2010 01:53:54 PM

## 2010-07-19 LAB — GLUCOSE, CAPILLARY: Glucose-Capillary: 119 mg/dL — ABNORMAL HIGH (ref 70–99)

## 2010-07-19 LAB — HEMOGLOBIN A1C
Hgb A1c MFr Bld: 6.2 % — ABNORMAL HIGH (ref <5.7)
Mean Plasma Glucose: 131 mg/dL — ABNORMAL HIGH (ref <117)

## 2010-07-19 LAB — BASIC METABOLIC PANEL
BUN: 4 mg/dL — ABNORMAL LOW (ref 6–23)
CO2: 28 mEq/L (ref 19–32)
Calcium: 8.4 mg/dL (ref 8.4–10.5)
Chloride: 107 mEq/L (ref 96–112)
Creatinine, Ser: 0.65 mg/dL (ref 0.4–1.2)
GFR calc Af Amer: >60 mL/min (ref >60)
GFR calc non Af Amer: >60 mL/min (ref >60)
Glucose, Bld: 104 mg/dL — ABNORMAL HIGH (ref 70–99)
Potassium: 3.3 mEq/L — ABNORMAL LOW (ref 3.5–5.1)
Sodium: 140 mEq/L (ref 135–145)

## 2010-07-19 LAB — CBC
HCT: 35.5 % — ABNORMAL LOW (ref 36.0–46.0)
Hemoglobin: 10.9 g/dL — ABNORMAL LOW (ref 12.0–15.0)
MCH: 28.1 pg (ref 26.0–34.0)
MCHC: 30.7 g/dL (ref 30.0–36.0)
MCV: 91.5 fL (ref 78.0–100.0)
Platelets: 203 10*3/uL (ref 150–400)
RBC: 3.88 MIL/uL (ref 3.87–5.11)
RDW: 15.8 % — ABNORMAL HIGH (ref 11.5–15.5)
WBC: 9 10*3/uL (ref 4.0–10.5)

## 2010-07-19 LAB — DIFFERENTIAL
Basophils Absolute: 0 10*3/uL (ref 0.0–0.1)
Basophils Relative: 0 % (ref 0–1)
Eosinophils Absolute: 0.2 10*3/uL (ref 0.0–0.7)
Eosinophils Relative: 2 % (ref 0–5)
Lymphocytes Relative: 27 % (ref 12–46)
Lymphs Abs: 2.5 10*3/uL (ref 0.7–4.0)
Monocytes Absolute: 0.8 10*3/uL (ref 0.1–1.0)
Monocytes Relative: 9 % (ref 3–12)
Neutro Abs: 5.6 10*3/uL (ref 1.7–7.7)
Neutrophils Relative %: 62 % (ref 43–77)

## 2010-07-19 LAB — LIPID PANEL
Cholesterol: 92 mg/dL (ref 0–200)
HDL: 30 mg/dL — ABNORMAL LOW (ref >39)
LDL Cholesterol: 48 mg/dL (ref 0–99)
Total CHOL/HDL Ratio: 3.1 RATIO
Triglycerides: 68 mg/dL (ref <150)
VLDL: 14 mg/dL (ref 0–40)

## 2010-07-20 LAB — GLUCOSE, CAPILLARY
Glucose-Capillary: 96 mg/dL (ref 70–99)
Glucose-Capillary: 103 mg/dL — ABNORMAL HIGH (ref 70–99)
Glucose-Capillary: 98 mg/dL (ref 70–99)
Glucose-Capillary: 90 mg/dL (ref 70–99)
Glucose-Capillary: 115 mg/dL — ABNORMAL HIGH (ref 70–99)
Glucose-Capillary: 90 mg/dL (ref 70–99)
Glucose-Capillary: 104 mg/dL — ABNORMAL HIGH (ref 70–99)
Glucose-Capillary: 134 mg/dL — ABNORMAL HIGH (ref 70–99)

## 2010-07-20 LAB — CLOSTRIDIUM DIFFICILE BY PCR: Toxigenic C. Difficile by PCR: NEGATIVE

## 2010-07-20 LAB — HEMOCCULT GUIAC POC 1CARD (OFFICE): Fecal Occult Bld: POSITIVE

## 2010-07-21 LAB — GLUCOSE, CAPILLARY: Glucose-Capillary: 101 mg/dL — ABNORMAL HIGH (ref 70–99)

## 2010-08-02 NOTE — Discharge Summary (Signed)
Wendy Heath, TURLEY             ACCOUNT NO.:  [id number]  MEDICAL RECORD NO.:  [id number]           PATIENT TYPE:  I  LOCATION:  1410                         FACILITY:  [facility]  PHYSICIAN:  Marinda Elk, M.D.DATE OF BIRTH:  1950/08/19  DATE OF ADMISSION:  07/18/2010 DATE OF DISCHARGE:  07/21/2010                              DISCHARGE SUMMARY   PRIMARY CARE DOCTOR:  [name], MD  DISCHARGE DIAGNOSES: 1. Acute diverticulitis. 2. Diabetes type 2. 3. Hypertension. 4. History of stroke.  DISCHARGE MEDICATIONS: 1. Augmentin 875 mg p.o. b.i.d. 2. Vicodin 5/500 q.6 h. p.r.n. 3. Amlodipine 10 mg daily. 4. Aspirin 81 mg daily. 5. Calcium 600 mg daily. 6. Crestor 10 mg daily. 7. Famotidine 40 mg b.i.d. 8. Lisinopril 10 mg daily. 9. Metformin 500 mg daily. 10.Metoprolol 50 mg daily. 11.Plavix 75 mg daily. 12.Vitamin D 3000 units daily.  PROCEDURES PERFORMED:  CT scan of the abdomen and pelvis that showed mild acute diverticulitis of the distal descending colon, no evidence of mass, abscess, or pneumoperitoneum.  BRIEF ADMITTING H AND P:  This is a 60 year old female with past medical history of hypertension, stroke, breast cancer presents to [name] ED with chief complaint of abdominal pain.  She reported around 4 a.m., she developed right lower quadrant pain.  She indicated that the pain was constant, however, it is intensified.  She described the pain as sharp, nothing makes it better or worse.  She developed diarrhea, which she remembered 3 episodes during the night, subsequently she developed nausea and vomiting throughout the night.  Denies any coffee-ground emesis or dark stool.  Please refer to dictation from July 18, 2010, for further details.  PHYSICAL EXAMINATION:  VITAL SIGNS:  Temperature 97, blood pressure 121/76, heart rate of 78, respirations 16.  She was saturating 100% on 2 L. GENERAL: She is awake, alert, and oriented x3. HEENT:   Normocephalic, atraumatic.  Mucous membranes are dry. Extraocular movements are intact.  No obvious exudates in ear, nose, and throat. NECK:  Supple.  No JVD.  Full range of motion.  No lymphadenopathy. CARDIOVASCULAR:  Regular rate and rhythm with positive S1 and S2.  No murmurs, rubs, or gallops. LUNGS:  She has good air movement.  Clear to auscultation. ABDOMEN:  She is soft.  Positive bowel sounds, very diminished in the right lower quadrant, diffuse tenderness to gentle palpation in the right lower quadrants and flank area. NEUROLOGIC:  Nonfocal.  LABS ON ADMISSION:  White count of 18.9, hemoglobin of 14, platelet count 274, ANC of 17.  Her sodium was 138, potassium 4.0, chloride 101, bicarb 24, glucose 165, BUN of 7, creatinine 0.7, bilirubin 1.4.  LFTs within normal limits.  UA showed small amount of blood, few bacteria.  BRIEF HOSPITAL COURSE:   1.Acute diverticulitis.  On admission, she was started on Flagyl and  Rocephin as she has a bad reaction to Cipro.  She defervesced.  Her  white count came down to 9.0.  Her C. diff was checked and was negative.   She was changed to Unasyn, which she developed a kind of itching.  She was given Benadryl  for this, it was changed to Augmentin, which she tolerated well overnight.  Her temperature continued to be within normal limits and no further allergies or itching, so she was continued on Augmentin for a total of 14 days, which she will continue at home.  She required pain medication to control her pain.  She moved her bowels.  So she will be sent out on Vicodin.  All other medical problems were within normal limits.  Vitals on day of discharge; temperature 98, pulse 70, respirations 20, blood pressure 106/70.  She was saturating 98% on room air.     Marinda Elk, M.D.     AF/MEDQ  D:  07/21/2010  T:  07/21/2010  Job:  [id number]  cc:   [name], M.D. Fax: [phone]  Electronically Signed by Lambert Keto M.D. on  08/02/2010 04:17:07 PM

## 2010-08-31 ENCOUNTER — Encounter (HOSPITAL_BASED_OUTPATIENT_CLINIC_OR_DEPARTMENT_OTHER): Payer: Medicare HMO | Admitting: Oncology

## 2010-08-31 ENCOUNTER — Other Ambulatory Visit: Payer: Self-pay | Admitting: Physician Assistant

## 2010-08-31 ENCOUNTER — Ambulatory Visit
Admission: RE | Admit: 2010-08-31 | Discharge: 2010-08-31 | Disposition: A | Discharge status: Home / Self Care | Payer: Medicare HMO | Source: Ambulatory Visit | Attending: Oncology | Admitting: Oncology

## 2010-08-31 DIAGNOSIS — C50419 Malignant neoplasm of upper-outer quadrant of unspecified female breast: Secondary | ICD-10-CM

## 2010-08-31 DIAGNOSIS — Z171 Estrogen receptor negative status [ER-]: Secondary | ICD-10-CM

## 2010-08-31 DIAGNOSIS — Z9889 Other specified postprocedural states: Secondary | ICD-10-CM

## 2010-08-31 LAB — CBC WITH DIFFERENTIAL/PLATELET
BASO%: 0.2 % (ref 0.0–2.0)
Basophils Absolute: 0 10*3/uL (ref 0.0–0.1)
EOS%: 2.1 % (ref 0.0–7.0)
Eosinophils Absolute: 0.2 10*3/uL (ref 0.0–0.5)
HCT: 39.8 % (ref 34.8–46.6)
HGB: 12.9 g/dL (ref 11.6–15.9)
LYMPH%: 20.1 % (ref 14.0–49.7)
MCH: 28.8 pg (ref 25.1–34.0)
MCHC: 32.3 g/dL (ref 31.5–36.0)
MCV: 89 fL (ref 79.5–101.0)
MONO#: 0.7 10*3/uL (ref 0.1–0.9)
MONO%: 8.7 % (ref 0.0–14.0)
NEUT#: 5.3 10*3/uL (ref 1.5–6.5)
NEUT%: 68.9 % (ref 38.4–76.8)
Platelets: 250 10*3/uL (ref 145–400)
RBC: 4.48 10*6/uL (ref 3.70–5.45)
RDW: 16.3 % — ABNORMAL HIGH (ref 11.2–14.5)
WBC: 7.8 10*3/uL (ref 3.9–10.3)
lymph#: 1.6 10*3/uL (ref 0.9–3.3)

## 2010-09-01 ENCOUNTER — Other Ambulatory Visit (HOSPITAL_COMMUNITY): Payer: Self-pay

## 2010-09-01 LAB — LACTATE DEHYDROGENASE: LDH: 154 U/L (ref 94–250)

## 2010-09-01 LAB — COMPREHENSIVE METABOLIC PANEL
ALT: 9 U/L (ref 0–35)
AST: 15 U/L (ref 0–37)
Albumin: 4.2 g/dL (ref 3.5–5.2)
Alkaline Phosphatase: 89 U/L (ref 39–117)
BUN: 17 mg/dL (ref 6–23)
CO2: 23 mEq/L (ref 19–32)
Calcium: 9.3 mg/dL (ref 8.4–10.5)
Chloride: 105 mEq/L (ref 96–112)
Creatinine, Ser: 0.75 mg/dL (ref 0.40–1.20)
Glucose, Bld: 94 mg/dL (ref 70–99)
Potassium: 4 mEq/L (ref 3.5–5.3)
Sodium: 140 mEq/L (ref 135–145)
Total Bilirubin: 0.3 mg/dL (ref 0.3–1.2)
Total Protein: 6.9 g/dL (ref 6.0–8.3)

## 2010-09-01 LAB — CANCER ANTIGEN 27.29: CA 27.29: 31 U/mL (ref 0–39)

## 2010-09-01 LAB — VITAMIN D 25 HYDROXY (VIT D DEFICIENCY, FRACTURES): Vit D, 25-Hydroxy: 57 ng/mL (ref 30–89)

## 2010-09-05 NOTE — Discharge Summary (Signed)
Wendy Heath, Wendy Heath             ACCOUNT NO.:  [id number]   MEDICAL RECORD NO.:  [id number]          PATIENT TYPE:  INP   LOCATION:  1422                         FACILITY:  [facility]   PHYSICIAN:  Elliot Cousin, M.D.    DATE OF BIRTH:  June 26, 1950   DATE OF ADMISSION:  01/30/2007  DATE OF DISCHARGE:  02/02/2007                               DISCHARGE SUMMARY   PRIMARY CARE PHYSICIAN:  Dr. Benetta Spar Rankins.   DISCHARGE DIAGNOSES:  1. Atypical chest pain with electrocardiogram changes.  The      electrocardiogram changes consisted of nonspecific anterolateral T-      wave abnormalities.  Myocardial infarction was ruled out.  CT scan      of the chest was negative for PE.  Persantine Myoview stress test      was negative for ischemia.  2. Decreased aeration of both lung bases, which likely represents a      combination of consolidation and atelectasis, pulmonary edema, and      no evidence for acute pulmonary embolism, per CT of the chest.  3. No clinical evidence of pulmonary edema as the patient's BNP was      less than 100.  4. Hypertension.  5. Hyperlipidemia.  6. Left-sided breast cancer, undergoing radiation therapy currently.   DISCHARGE MEDICATIONS:  1. Vicodin 5 mg every 4 hours as needed for pain.  2. Ibuprofen 400 mg every 6-8 hours as needed for pain.  3. Omeprazole 20 mg daily.  4. Lisinopril 10 mg daily.  5. Plavix 75 mg daily.  6. Crestor 10 mg daily.  7. Metoprolol 50 mg daily.  8. Amlodipine 10 mg daily.  9. Aspirin 81 mg daily.   CONSULTATIONS:  Cardiologist. Ricki Rodriguez, M.D.   PROCEDURES PERFORMED:  1. Chest x-ray on January 31, 2007.  The results revealed patchy      atelectasis at the bases with a small effusion.  2. CT angiogram of the chest on January 30, 2007.  The results are as      above.  3. Persantine Myoview performed on February 01, 2007.  The results were      negative for ischemia.  4. 2-D echocardiogram performed on January 30, 2007.  The  results are      below.   HISTORY OF PRESENT ILLNESS:  The patient is a 60 year old woman with a  past medical history significant for left-sided breast cancer,  hypertension, and hyperlipidemia.  She presented to the emergency  department on January 30, 2007, with a chief complaint of 8/10 sharp  substernal chest pain that woke her up out of her sleep at 1 a.m. on the  morning that she presented to the emergency department.  The pain at  times was somewhat positional.   When she was evaluated in the emergency department, her initial cardiac  markers were negative.  The EKG revealed T-wave inversions in V1 through  V6.  Her chest x-ray was read as mild edema.  The patient was  therefore admitted for further evaluation and management.   For additional details please see the dictated history and  physical.   HOSPITAL COURSE:  1. CHEST PAIN:  The patient was started on symptomatic treatment with      as-needed nitroglycerin sublingually, prophylactic Protonix, and as-      needed Dilaudid.  In addition, she was continued on Plavix and      aspirin.  Because of the relatively low blood pressure, the      metoprolol, lisinopril  were held.  For further evaluation, cardiac      enzymes were ordered as well as a CT scan of the chest to rule out      PE.  The cardiac enzymes were completely negative x3 sets.  The CT      scan of the chest revealed bibasilar atelectasis versus      consolidation and pulmonary edema.  The patient was therefore      started on empiric antibiotic treatment with Rocephin and      azithromycin.  She was also started on treatment with small doses      of Lasix p.o. and IV.   Her BNP was assessed and found to be within normal limits at 43.  The  Lasix was subsequently discontinued.  Although the CT scan of the chest  revealed questionable consolidation, the patient remained afebrile  during the complete hospitalization.  In fact, she was afebrile at the  time of  the initial hospital assessment.  Her white blood cell count was  also normal throughout the hospitalization.  Therefore, at the time of  hospital discharge, the antibiotics were discontinued after 3 days of  therapy.   Given the patient's risk factors and the EKG changes, cardiologist Dr.  Algie Coffer was consulted.  He recommended that the patient undergo a  Persantine Myoview stress test.  The stress test was performed on  February 01, 2007.  During the stress part of the exam, the patient had  no complaints of anginal-like pain and there were no ST changes.  The  Persantine Myoview scan revealed negative evidence of ischemia.  In  addition, a 2-D echocardiogram was ordered.  The 2-D echo echocardiogram  as performed on January 30, 2007, revealed an ejection fraction of 65%,  no left ventricular regional wall motion abnormalities, and mild aortic  valve thickness.   Throughout the hospital course, the patient was treated with medications  as listed above.  The nitroglycerin was discontinued as the patient's  blood pressure fell into the upper 80s and low 90s.  She was started on  gentle IV fluids because of the relative hypotension.  Eventually,  Motrin was added p.r.n. as it was felt that the patient may have had an  element of chest wall pain either from spasm or as a possible side  effect of radiation.  Prior to the hospital discharge, the patient's  pain completely resolved.   Problem 2.  HYPERTENSION:  The patient was started on low-dose  Lopressor.  Her other antihypertensive medications were withheld as her  blood pressure was on the low normal side.  In addition, the patient  could not recall the dosing of her other antihypertensive medications.  However, as her blood pressure increased, the other medications were  added.  Once the patient brought in a list of the doses of her  medications, they were started or restarted accordingly.  Therefore, the  metoprolol was increased to  50 mg daily and the amlodipine and  lisinopril were also added.  As of now, the patient's blood pressure is  well within  normal limits.   Problem 3.  HYPERLIPIDEMIA:  The patient's fasting lipid panel was  assessed during the hospitalization.  The results revealed a total  cholesterol of 128, triglycerides of 74, HDL cholesterol of 40, and LDL  cholesterol of 73.  The patient was maintained on Crestor during the  hospitalization.   Problem 4.  LEFT-SIDED BREAST CANCER:  The patient did undergo one  treatment of radiation during her hospitalization.  She was advised to  follow up with Dr. Roselind Messier for ongoing therapy.   Additional labs ordered included a TSH, which was within normal limits  at 0.377; a hepatic function panel, which was within normal limits  completely; and a D-dimer, which was mildly elevated at 0.67.      Elliot Cousin, M.D.  Electronically Signed     DF/MEDQ  D:  02/02/2007  T:  02/03/2007  Job:  [id number]   cc:   Fanny Dance. Rankins, M.D.  Fax: [phone]   Ricki Rodriguez, M.D.  Fax: [phone]

## 2010-09-05 NOTE — H&P (Signed)
Wendy Heath, Wendy Heath             ACCOUNT NO.:  [id number]   MEDICAL RECORD NO.:  [id number]          PATIENT TYPE:  INP   LOCATION:  1422                         FACILITY:  [facility]   PHYSICIAN:  Hillery Aldo, M.D.   DATE OF BIRTH:  1950/09/08   DATE OF ADMISSION:  01/30/2007  DATE OF DISCHARGE:                              HISTORY & PHYSICAL   PRIMARY CARE PHYSICIAN:  Turkey R. Rankins, M.D.   CHIEF COMPLAINT:  Chest pain.   HISTORY OF PRESENT ILLNESS:  The patient is a 60 year old female who  experienced the acute-onset of substernal chest pain while getting ready  for bed at  approximately 1 a.m. tonight.  She describes the chest pain  as being burning in quality.  There was some associated radiation to the  shoulders and back.  The pain has a pleuritic quality in that it  increases with deep respiration.  The patient states she was unable to  lie flat on her back earlier due to the pain, but it has eased off now.  She denies any associated nausea or vomiting.  She denies any associated  diaphoresis.  The pain was originally 5/10 when it came on and got worse  and rated at a 7-8/10.  It is currently rated as a 2/10.  Due to her  risk factor profile, she is being admitted for further evaluation and  workup.   PAST MEDICAL HISTORY:  1. Hypertension.  2. Hyperlipidemia.  3. History of pyelonephritis.  4. Cerebrovascular disease.  5. History of breast cancer, status post lumpectomy, chemotherapy      (finished June 2008), currently receiving radiation therapy.  6. Status post hysterectomy.  7. Status post appendectomy.   FAMILY HISTORY:  The patient's mother died at 52 from breast cancer.  The patient's father died at 42 from an acute MI.  She has one brother  who died at 4 from an acute MI.  She has three healthy sisters.  One  other sister has breast cancer.   SOCIAL HISTORY:  The patient is recently divorced.  She currently lives  with her son.  She smokes about two  cigarettes per day, but cut down  from an approximately two pack per week habit.  She drinks wine rarely  on social occasions.  The last time was 2 weeks ago.  She denies any  drug use.  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   ALLERGIES:  The patient had a rash reaction to CIPRO OR PYRIDIUM.   CURRENT MEDICATIONS:  1. Aspirin 81 mg daily.  2. Enalapril.  3. Hydrochlorothiazide.  4. Plavix 75 mg daily.  5. Crestor.  6. Norvasc.  7. Generic proton pump inhibitor.   The patient is unsure of all of her medications and their dosages,  however, the son is planning to bring these in the later on today.   REVIEW OF SYSTEMS:  The patient admits to cold intolerance, but no  fever.  Appetite has been okay.  She denies any cough.  She has had some  dyspnea associated with her symptoms tonight.  She denies any nausea or  vomiting or diarrhea.  She has not had any melena or hematochezia.  She  does have some chronic constipation.  She takes a stool softener or  laxative one time a week.   PHYSICAL EXAMINATION:  VITAL SIGNS:  Temperature 98.4, pulse 75,  respirations 19, blood pressure 109/70, O2 saturation 98% on room air.  GENERAL:  This a well-developed, well-nourished female who is in no  acute distress.  HEENT:  Normocephalic, atraumatic.  PERRL.  EOMI.  Oropharynx is clear.  NECK:  Supple, no thyromegaly, no lymphadenopathy, no jugular venous  distention.  CHEST:  Decreased breath sounds bilaterally.  HEART:  Regular rate, rhythm.  No murmurs, rubs or gallops.  ABDOMEN:  Soft, nontender, nondistended with normoactive bowel sounds.  EXTREMITIES:  No clubbing, edema, cyanosis.  SKIN:  Warm and dry.  She does have melanin deposits in her nails  bilaterally secondary to chemotherapy.  NEUROLOGIC:  The patient is alert and oriented x3.  Cranial nerves 2-12  grossly intact.  Nonfocal.   LABORATORY DATA AND X-RAY FINDINGS:  A 12-lead EKG shows normal sinus  rhythm  at 61 beats per minute.  There are T-wave inversions in V1-V6  consistent with anterolateral ischemia.  These are new when compared to  an EKG dated July 05, 2006.  Chest x-ray shows mild edema.  CT  angiogram of chest shows no evidence of pulmonary embolism.  There is  mild edema and decreased aeration in both lung bases secondary to a  combination of atelectasis and air space consolidation.   D-dimer was 0.67.  Sodium is 140, potassium 3.5, chloride 102, bicarb  27, BUN 7, creatinine 0.84, glucose 130.  White blood cell count was  6.1, hemoglobin 12.7, hematocrit 38.7, platelets 243.  Point of care  cardiac markers are negative x1.   ASSESSMENT/PLAN:  1. Chest pain.  The patient's chest pain certainly has an atypical      quality.  This could be secondary to radiation-induced esophagitis      or pleuritic causes secondary to pulmonary edema or early      pneumonia.  Nevertheless, the patient does have significant risk      factors for cardiac disease including family history, history of      smoking, radiation therapy to the chest and hypertension as well as      hyperlipidemia.  We will admit her to the telemetry unit and cycle      cardiac enzymes q.8 h. x3 sets.  We will obtain serial EKG      tracings.  Given her pulmonary edema, I will check a 2-D      echocardiogram and B-type natriuretic peptide level to rule out      congestive heart failure.  Given her air space consolidation, I      will treat her for community-acquired pneumonia, although she is      afebrile and does not have a leukocytosis.  2. Hypertension.  The patient's antihypertensives will be continued      when her exact regimen and dosages are known.  She is not currently      hypertensive.  I will give her one dose of Lasix given her      pulmonary edema.  3. Hyperlipidemia.  Will continue the patient's Crestor at her home      dose problem.  4. Cerebrovascular disease.  Continue the patient's aspirin and       Plavix.  5. Breast cancer.  Will make sure the patient sees Dr. Roselind Messier today at      her regularly scheduled appointment at      9:40 a.m.  6. Prophylaxis.  Initiate gastrointestinal prophylaxis with Protonix      and deep venous thrombosis prophylaxis with Lovenox.      Hillery Aldo, M.D.  Electronically Signed     CR/MEDQ  D:  01/30/2007  T:  01/30/2007  Job:  [id number]

## 2010-09-05 NOTE — Op Note (Signed)
Wendy Heath, Wendy Heath             ACCOUNT NO.:  [id number]   MEDICAL RECORD NO.:  [id number]          PATIENT TYPE:  AMB   LOCATION:  DSC                          FACILITY:  [facility]   PHYSICIAN:  Wilmon Arms. Corliss Skains, M.D. DATE OF BIRTH:  26-Apr-1950   DATE OF PROCEDURE:  05/26/2007  DATE OF DISCHARGE:                               OPERATIVE REPORT   PREOPERATIVE DIAGNOSIS:  Left breast cancer.   POSTOPERATIVE DIAGNOSIS.:  Left breast cancer.   PROCEDURE:  Left subclavian vein port removal.   SURGEON:  Wilmon Arms. Tsuei, M.D.   ANESTHESIA:  Local MAC.   INDICATIONS:  The patient is a 60 year old female who was found to have  a large left breast cancer in February 2008.  She underwent neoadjuvant  chemotherapy, and has had a subsequent course of radiation after her  lumpectomy and sentinel lymph node biopsy.  She is doing quite well.  Her oncologist has sent her back for port removal.   DESCRIPTION OF PROCEDURE:  The patient was brought to the operating room  and placed in a supine position on the operating room table.  After an  adequate level intravenous sedation was given, her left chest was  prepped with Betadine and draped in a sterile fashion.  The skin around  the port was then infiltrated with 0.25% Marcaine with epinephrine, and  her incision was opened with a scalpel.  Dissection was carried down to  the port with cautery.  The catheter was removed from the subclavian  vein.  Pressure was held for several minutes for hemostasis.  No  hematoma was noted.  The Prolene sutures were then cut.  The port was  then removed.  Hemostasis was obtained with pressure and cautery.  The  wound was closed with a deep layer of 3-0 Vicryl and the subcuticular  layer with 4-0 Monocryl.  Steri-Strips and clean dressings were applied.  The patient was then awakened and brought to the recovery room in stable  condition.  All sponge, instrument and needle counts were correct.      Wilmon Arms.  Tsuei, M.D.  Electronically Signed     MKT/MEDQ  D:  05/26/2007  T:  05/26/2007  Job:  [id number]   cc:   Pierce Crane, MD

## 2010-09-05 NOTE — Op Note (Signed)
Wendy Heath, Wendy Heath             ACCOUNT NO.:  [id number]   MEDICAL RECORD NO.:  [id number]          PATIENT TYPE:  AMB   LOCATION:  DSC                          FACILITY:  [facility]   PHYSICIAN:  Wilmon Arms. Corliss Skains, M.D. DATE OF BIRTH:  12-May-1950   DATE OF PROCEDURE:  11/25/2006  DATE OF DISCHARGE:                               OPERATIVE REPORT   PREOPERATIVE DIAGNOSIS:  Left breast cancer.   POSTOPERATIVE DIAGNOSIS:  Left breast cancer.   PROCEDURES PERFORMED:  1. Blue dye injection.  2. Left axillary sentinel lymph node biopsy.  3. Left needle-localized partial mastectomy.   SURGEON:  Wilmon Arms. Tsuei, M.D.   ANESTHESIA:  General via LMA.   INDICATIONS:  The patient is a 60 year old female who was diagnosed with  a left breast cancer on core biopsy.  She had a large tumor and  underwent neoadjuvant chemotherapy.  Her primary tumor is no longer  palpable.  An MRI shows that it had shrunk to a size of 1.3 x 1.1 cm,  down from the original size of 3 x 2.8 x 2.1 cm.  The MRI showed no sign  of axillary lymphadenopathy.  She presents today for needle-localized  partial mastectomy and sentinel lymph node biopsy.   DESCRIPTION OF PROCEDURE:  The patient was brought to the operating room  and placed in the supine position operating table.  After an adequate  level of general anesthesia was obtained, her left nipple was prepped  with alcohol.  We injected 5 cc of methylene blue intradermally.  The  area was then massaged for 5 minutes. Her left breast maxilla and axilla  were then prepped with Betadine and draped in a sterile fashion.  The  Neoprobe was draped with a sterile drape, and we identified a hot spot  in her left axilla.  We made a transverse incision along a skin fold  over this area.  We dissected down deep into the axilla, and I  identified a hot blue noted.  This was dissected free and had an ex vivo  count of 2957.  This was sent for pathologic examination.  The  background count was less than 200.  The wound was then packed with  saline-soaked gauze after hemostasis was obtained.  We then created an  elliptical incision around the wire in the upper left breast.  Skin  flaps were raised.  Cautery was used to take a cylinder of tissue around  the wire until we were near the chest wall.  The specimen was removed  and oriented with a long suture lateral and short suture superior.  At  this point, we received a verbal report from the pathologist that the  sentinel lymph node was negative on touch preps.  This wound was then  closed with a deep layer of 3-0 Vicryl and subcuticular layer of 4-0  Monocryl.  We also received a verbal report that our needle-localized  specimen  contained the biopsy clip.  This wound was also closed in a similar  fashion.  Steri-Strips and clean dressings were applied.  The patient  was extubated and brought  to the recovery room in stable condition.  All  sponge, instrument, and needle counts were correct.      Wilmon Arms. Tsuei, M.D.  Electronically Signed     MKT/MEDQ  D:  11/25/2006  T:  11/25/2006  Job:  [id number]   cc:   Pierce Crane, M.D.

## 2010-09-07 ENCOUNTER — Ambulatory Visit (HOSPITAL_COMMUNITY)
Admission: RE | Admit: 2010-09-07 | Discharge: 2010-09-07 | Disposition: A | Discharge status: Home / Self Care | Payer: Medicare HMO | Source: Ambulatory Visit | Attending: Oncology | Admitting: Oncology

## 2010-09-07 DIAGNOSIS — Z853 Personal history of malignant neoplasm of breast: Secondary | ICD-10-CM | POA: Insufficient documentation

## 2010-09-07 DIAGNOSIS — C50919 Malignant neoplasm of unspecified site of unspecified female breast: Secondary | ICD-10-CM

## 2010-09-07 MED ORDER — GADOBENATE DIMEGLUMINE 529 MG/ML IV SOLN
20.0000 mL | Freq: Once | INTRAVENOUS | Status: AC | PRN
  Administered 2010-09-07: 20 mL via INTRAVENOUS

## 2010-09-08 NOTE — Op Note (Signed)
NAMEKAYLA, DESHAIES             ACCOUNT NO.:  [id number]   MEDICAL RECORD NO.:  [id number]          PATIENT TYPE:  AMB   LOCATION:  DAY                          FACILITY:  [facility]   PHYSICIAN:  Wilmon Arms. Corliss Skains, M.D. DATE OF BIRTH:  05/01/1950   DATE OF PROCEDURE:  07/05/2006  DATE OF DISCHARGE:                               OPERATIVE REPORT   PREOP DIAGNOSIS:  Left breast cancer.   POSTOPERATIVE DIAGNOSIS:  Left breast cancer.   PROCEDURE PERFORMED:  Left subclavian vein Port-A-Cath placement.   SURGEON:  Wilmon Arms. Tsuei, M.D.   ANESTHESIA:  General via LMA   INDICATIONS:  The patient is a 60 year old female with a past medical  history of hypertension, hypercholesterolemia, gastroesophageal reflux,  and a stroke who presents with a left breast mass.  Workup showed a 3 cm  invasive mammary cancer.  The patient was undergoing preoperative  chemotherapy and requires a port for venous access.   DESCRIPTION OF PROCEDURE:  The patient was brought to the operating  room, and placed in the supine position on the operating room table.  After an adequate level of general anesthesia was obtained; the  patient's arms were tucked her sides; and a roll was placed below her  shoulders.  Her left chest was prepped with Betadine and draped in a  sterile fashion.  She was positioned in Trendelenburg position.  Her  jugular notch and left clavicle were palpated.   The left subclavian vein was then cannulated with an 18-gauge needle.  The wire was advanced through the needle and fluoroscopy confirmed that  it entered the right atrium.  The needle was removed.  A subcutaneous  pocket was then created below the insertion site.  Once the pocket was  large enough a 9.6 French single-lumen preattached Bard port was brought  onto the field.  It was flushed free of air.  The catheter was tunneled  to the initial insertion site.  The port was then secured in place with  two interrupted 2-0 Prolene  sutures.  Using fluoroscopy, the catheter  was measured.  The patient is obese, and has a very short chest.  There  was virtually no distance between her clavicle and the top of the  pericardial shadow.   The catheter was then cut at 26 cm.  The introducer and breakaway sheath  were then passed over the wire through the clavipectoral fascia into  this subclavian vein.  The wire and introducer were removed; and the  catheter was advanced through the breakaway sheath which was removed.  There were no kinks in the catheter.  The catheter withdrew easily with  good blood return and flushed easily.  The fluoroscopy showed that the  tip was in the upper right atrium or distal superior vena cava.  It is  felt that when the patient is upright, her breast would probably pull  the catheter back somewhat, so the catheter was left a little bit long.   The catheter was then flushed with concentrated heparin solution.  The  insertion site was closed with a 3-0 Vicryl suture.  Then 3-0  Vicryl was  used to close the pocket incision; and a 4-0 Monocryl was used to close  the skin.  Steri-Strips and clean dressings were applied.  A chest x-ray  is pending.      Wilmon Arms. Tsuei, M.D.  Electronically Signed     MKT/MEDQ  D:  07/05/2006  T:  07/06/2006  Job:  [id number]   cc:   Pierce Crane, M.D.  Fax: [phone]

## 2010-09-11 ENCOUNTER — Other Ambulatory Visit: Payer: Self-pay | Admitting: Oncology

## 2010-09-11 ENCOUNTER — Ambulatory Visit (HOSPITAL_COMMUNITY)
Admission: RE | Admit: 2010-09-11 | Discharge: 2010-09-11 | Disposition: A | Discharge status: Home / Self Care | Payer: Medicare HMO | Source: Ambulatory Visit | Attending: Oncology | Admitting: Oncology

## 2010-09-11 ENCOUNTER — Encounter (HOSPITAL_BASED_OUTPATIENT_CLINIC_OR_DEPARTMENT_OTHER): Payer: Medicare HMO | Admitting: Oncology

## 2010-09-11 DIAGNOSIS — C50419 Malignant neoplasm of upper-outer quadrant of unspecified female breast: Secondary | ICD-10-CM

## 2010-09-11 DIAGNOSIS — R52 Pain, unspecified: Secondary | ICD-10-CM

## 2010-09-11 DIAGNOSIS — M25519 Pain in unspecified shoulder: Secondary | ICD-10-CM | POA: Insufficient documentation

## 2010-09-11 DIAGNOSIS — M25619 Stiffness of unspecified shoulder, not elsewhere classified: Secondary | ICD-10-CM | POA: Insufficient documentation

## 2011-01-11 LAB — BASIC METABOLIC PANEL
BUN: 11
CO2: 27
Calcium: 9.6
Chloride: 105
Creatinine, Ser: 0.63
GFR calc Af Amer: >60
GFR calc non Af Amer: >60
Glucose, Bld: 98
Potassium: 4
Sodium: 138

## 2011-01-12 LAB — POCT HEMOGLOBIN-HEMACUE: Hemoglobin: 13.4

## 2011-02-01 LAB — LIPID PANEL
Cholesterol: 115
Cholesterol: 128
HDL: 40
HDL: 40
LDL Cholesterol: 65
LDL Cholesterol: 73
Total CHOL/HDL Ratio: 2.9
Total CHOL/HDL Ratio: 3.2
Triglycerides: 52
Triglycerides: 74
VLDL: 10
VLDL: 15

## 2011-02-01 LAB — POCT CARDIAC MARKERS
CKMB, poc: <1 — ABNORMAL LOW
Myoglobin, poc: 34.2
Operator id: 1192
Troponin i, poc: <0.05

## 2011-02-01 LAB — HEPATIC FUNCTION PANEL
ALT: 16
AST: 23
Albumin: 3.8
Alkaline Phosphatase: 101
Bilirubin, Direct: 0.1
Indirect Bilirubin: 0.4
Total Bilirubin: 0.5
Total Protein: 6.7

## 2011-02-01 LAB — URINALYSIS, MICROSCOPIC ONLY
Bilirubin Urine: NEGATIVE
Glucose, UA: NEGATIVE
Hgb urine dipstick: NEGATIVE
Ketones, ur: NEGATIVE
Leukocytes, UA: NEGATIVE
Nitrite: NEGATIVE
Protein, ur: NEGATIVE
Specific Gravity, Urine: 1.029
Urobilinogen, UA: 0.2
pH: 7

## 2011-02-01 LAB — DIFFERENTIAL
Basophils Absolute: 0
Basophils Relative: 0
Eosinophils Absolute: 0.1
Eosinophils Relative: 2
Lymphocytes Relative: 27
Lymphs Abs: 1.6
Monocytes Absolute: 0.6
Monocytes Relative: 10
Neutro Abs: 3.7
Neutrophils Relative %: 62

## 2011-02-01 LAB — BASIC METABOLIC PANEL
BUN: 7
BUN: 7
BUN: 8
CO2: 25
CO2: 27
CO2: 27
Calcium: 8.9
Calcium: 9.5
Calcium: 9.9
Chloride: 102
Chloride: 104
Chloride: 111
Creatinine, Ser: 0.75
Creatinine, Ser: 0.78
Creatinine, Ser: 0.84
GFR calc Af Amer: >60
GFR calc Af Amer: >60
GFR calc Af Amer: >60
GFR calc non Af Amer: >60
GFR calc non Af Amer: >60
GFR calc non Af Amer: >60
Glucose, Bld: 100 — ABNORMAL HIGH
Glucose, Bld: 130 — ABNORMAL HIGH
Glucose, Bld: 99
Potassium: 3.5
Potassium: 3.6
Potassium: 4
Sodium: 140
Sodium: 140
Sodium: 141

## 2011-02-01 LAB — CARDIAC PANEL(CRET KIN+CKTOT+MB+TROPI)
CK, MB: 0.4
CK, MB: 0.7
Relative Index: INVALID
Relative Index: INVALID
Total CK: 40
Total CK: 53
Troponin I: 0.02
Troponin I: 0.04

## 2011-02-01 LAB — CBC
HCT: 33.1 — ABNORMAL LOW
HCT: 38.7
Hemoglobin: 10.9 — ABNORMAL LOW
Hemoglobin: 12.7
MCHC: 32.8
MCHC: 33.1
MCV: 83.6
MCV: 84.3
Platelets: 183
Platelets: 243
RBC: 3.95
RBC: 4.59
RDW: 19.2 — ABNORMAL HIGH
RDW: 19.5 — ABNORMAL HIGH
WBC: 6.1
WBC: 6.9

## 2011-02-01 LAB — D-DIMER, QUANTITATIVE: D-Dimer, Quant: 0.67 — ABNORMAL HIGH

## 2011-02-01 LAB — CK TOTAL AND CKMB (NOT AT ARMC)
CK, MB: 0.9
Relative Index: INVALID
Total CK: 73

## 2011-02-01 LAB — TSH: TSH: 0.377

## 2011-02-01 LAB — TROPONIN I: Troponin I: 0.02

## 2011-02-01 LAB — B-NATRIURETIC PEPTIDE (CONVERTED LAB)
Pro B Natriuretic peptide (BNP): 43.1
Pro B Natriuretic peptide (BNP): 97.4

## 2011-02-05 LAB — POCT HEMOGLOBIN-HEMACUE
Hemoglobin: 11.8 — ABNORMAL LOW
Operator id: 154361

## 2011-02-05 LAB — DIFFERENTIAL
Basophils Absolute: 0
Basophils Relative: 1
Eosinophils Absolute: 0.1
Eosinophils Relative: 2
Lymphocytes Relative: 25
Lymphs Abs: 1.2
Monocytes Absolute: 0.5
Monocytes Relative: 10
Neutro Abs: 3
Neutrophils Relative %: 63

## 2011-02-05 LAB — BASIC METABOLIC PANEL
BUN: 7
CO2: 28
Calcium: 9.6
Chloride: 106
Creatinine, Ser: 0.71
GFR calc Af Amer: >60
GFR calc non Af Amer: >60
Glucose, Bld: 100 — ABNORMAL HIGH
Potassium: 3 — ABNORMAL LOW
Sodium: 141

## 2011-02-05 LAB — CBC
HCT: 35.5 — ABNORMAL LOW
Hemoglobin: 11.3 — ABNORMAL LOW
MCHC: 31.9
MCV: 88.6
Platelets: 265
RBC: 4
RDW: 20.7 — ABNORMAL HIGH
WBC: 4.8

## 2011-03-07 ENCOUNTER — Other Ambulatory Visit (HOSPITAL_BASED_OUTPATIENT_CLINIC_OR_DEPARTMENT_OTHER): Payer: Medicare HMO | Admitting: Lab

## 2011-03-07 ENCOUNTER — Other Ambulatory Visit: Payer: Self-pay | Admitting: Oncology

## 2011-03-07 DIAGNOSIS — C50419 Malignant neoplasm of upper-outer quadrant of unspecified female breast: Secondary | ICD-10-CM

## 2011-03-07 DIAGNOSIS — Z171 Estrogen receptor negative status [ER-]: Secondary | ICD-10-CM

## 2011-03-07 LAB — CBC WITH DIFFERENTIAL/PLATELET
BASO%: 0.7 % (ref 0.0–2.0)
Basophils Absolute: 0 10*3/uL (ref 0.0–0.1)
EOS%: 2.8 % (ref 0.0–7.0)
Eosinophils Absolute: 0.2 10*3/uL (ref 0.0–0.5)
HCT: 40.4 % (ref 34.8–46.6)
HGB: 13.1 g/dL (ref 11.6–15.9)
LYMPH%: 23.7 % (ref 14.0–49.7)
MCH: 29 pg (ref 25.1–34.0)
MCHC: 32.4 g/dL (ref 31.5–36.0)
MCV: 89.6 fL (ref 79.5–101.0)
MONO#: 0.6 10*3/uL (ref 0.1–0.9)
MONO%: 8.7 % (ref 0.0–14.0)
NEUT#: 4.7 10*3/uL (ref 1.5–6.5)
NEUT%: 64.1 % (ref 38.4–76.8)
Platelets: 249 10*3/uL (ref 145–400)
RBC: 4.51 10*6/uL (ref 3.70–5.45)
RDW: 16.7 % — ABNORMAL HIGH (ref 11.2–14.5)
WBC: 7.3 10*3/uL (ref 3.9–10.3)
lymph#: 1.7 10*3/uL (ref 0.9–3.3)

## 2011-03-07 LAB — CANCER ANTIGEN 27.29: CA 27.29: 30 U/mL (ref 0–39)

## 2011-03-07 LAB — VITAMIN D 25 HYDROXY (VIT D DEFICIENCY, FRACTURES): Vit D, 25-Hydroxy: 60 ng/mL (ref 30–89)

## 2011-03-07 LAB — COMPREHENSIVE METABOLIC PANEL
ALT: 8 U/L (ref 0–35)
AST: 12 U/L (ref 0–37)
Albumin: 4.2 g/dL (ref 3.5–5.2)
Alkaline Phosphatase: 105 U/L (ref 39–117)
BUN: 15 mg/dL (ref 6–23)
CO2: 29 mEq/L (ref 19–32)
Calcium: 9.9 mg/dL (ref 8.4–10.5)
Chloride: 105 mEq/L (ref 96–112)
Creatinine, Ser: 0.85 mg/dL (ref 0.50–1.10)
Glucose, Bld: 96 mg/dL (ref 70–99)
Potassium: 3.6 mEq/L (ref 3.5–5.3)
Sodium: 145 mEq/L (ref 135–145)
Total Bilirubin: 0.3 mg/dL (ref 0.3–1.2)
Total Protein: 7 g/dL (ref 6.0–8.3)

## 2011-03-13 ENCOUNTER — Ambulatory Visit (HOSPITAL_BASED_OUTPATIENT_CLINIC_OR_DEPARTMENT_OTHER): Payer: Medicare HMO | Admitting: Oncology

## 2011-03-13 ENCOUNTER — Telehealth: Payer: Self-pay | Admitting: Oncology

## 2011-03-13 VITALS — BP 137/89 | HR 80 | Temp 97.8°F | Wt 201.1 lb

## 2011-03-13 DIAGNOSIS — C50919 Malignant neoplasm of unspecified site of unspecified female breast: Secondary | ICD-10-CM

## 2011-03-13 DIAGNOSIS — Z171 Estrogen receptor negative status [ER-]: Secondary | ICD-10-CM

## 2011-03-13 DIAGNOSIS — C50419 Malignant neoplasm of upper-outer quadrant of unspecified female breast: Secondary | ICD-10-CM

## 2011-03-13 HISTORY — DX: Malignant neoplasm of unspecified site of unspecified female breast: C50.919

## 2011-03-13 NOTE — Telephone Encounter (Signed)
gv pt appt for [id number]

## 2011-03-13 NOTE — Progress Notes (Signed)
Hematology and Oncology Follow Up Visit  Wendy Heath [id number] May 06, 1950 60 y.o. 03/13/2011 9:35 AM   Principle Diagnosis: Locally advanced [place] left breast cancer, completion of neoadjuvant chemotherapy, followed by lumpectomy and sentinel lymph node evaluation on 11/25/2006 followed by radiation  Interim History:  Patient feels well has no current problems general medical health is good. Medications are used as before. Has some issues with the left eye acuity  Medications: I have reviewed the patient's current medications.  Allergies: No Known Allergies  Past Medical History, Surgical history, Social history, and Family History were reviewed and updated.  Review of Systems: Constitutional:  Negative for fever, chills, night sweats, anorexia, weight loss, pain. Cardiovascular: no chest pain or dyspnea on exertion Respiratory: no cough, shortness of breath, or wheezing Neurological: negative Dermatological: negative ENT: negative Skin Gastrointestinal: negative Genito-Urinary: negative Hematological and Lymphatic: negative Breast: negative for breast lumps Musculoskeletal: negative Remaining ROS negative.  Physical Exam: Blood pressure 137/89, pulse 80, temperature 97.8 F (36.6 C), weight 201 lb 1.6 oz (91.218 kg). ECOG:  General appearance: alert, cooperative and appears stated age Head: Normocephalic, without obvious abnormality, atraumatic Neck: no adenopathy, no carotid bruit, no JVD, supple, symmetrical, trachea midline and thyroid not enlarged, symmetric, no tenderness/mass/nodules Lymph nodes: Cervical, supraclavicular, and axillary nodes normal. Cardiac : Normal heart sounds  Pulmonary: Normal breath sounds Breasts: Large pendulous breasts with no obvious masses, left lumpectomy scar healed well. No nipple retraction or other skin change Abdomen: No masses or organomegaly Extremities normal Neuro: Normal  Lab Results: Lab Results  Component Value Date   WBC 7.3 03/07/2011   HGB 13.1 03/07/2011   HCT 40.4 03/07/2011   MCV 89.6 03/07/2011   PLT 249 03/07/2011     Chemistry      Component Value Date/Time   NA 145 03/07/2011 0726   NA 145 03/07/2011 0726   K 3.6 03/07/2011 0726   K 3.6 03/07/2011 0726   CL 105 03/07/2011 0726   CL 105 03/07/2011 0726   CO2 29 03/07/2011 0726   CO2 29 03/07/2011 0726   BUN 15 03/07/2011 0726   BUN 15 03/07/2011 0726   CREATININE 0.85 03/07/2011 0726   CREATININE 0.85 03/07/2011 0726      Component Value Date/Time   CALCIUM 9.9 03/07/2011 0726   CALCIUM 9.9 03/07/2011 0726   ALKPHOS 105 03/07/2011 0726   ALKPHOS 105 03/07/2011 0726   AST 12 03/07/2011 0726   AST 12 03/07/2011 0726   ALT 8 03/07/2011 0726   ALT 8 03/07/2011 0726   BILITOT 0.3 03/07/2011 0726   BILITOT 0.3 03/07/2011 0726       Radiological Studies: chest X-ray n/a Mammogram Due 5/12 Bone density n/a  Impression and Plan: The patient is doing well. There is no evidence for recurrence. I will see her in 6 months for followup. She is active and doing water aerobics 3 times a week. She will have a followup mammogram in May. Lab work currently looks excellent I recommended that she have regular medical followup as well.  More than 50% of the visit was spent in patient-related counselling   Pierce Crane, MD 11/20/20129:35 AM

## 2011-06-30 ENCOUNTER — Encounter: Payer: Self-pay | Admitting: Internal Medicine

## 2011-06-30 DIAGNOSIS — Z0000 Encounter for general adult medical examination without abnormal findings: Secondary | ICD-10-CM | POA: Insufficient documentation

## 2011-07-04 ENCOUNTER — Other Ambulatory Visit (INDEPENDENT_AMBULATORY_CARE_PROVIDER_SITE_OTHER): Payer: Medicare Other

## 2011-07-04 ENCOUNTER — Encounter: Payer: Self-pay | Admitting: Internal Medicine

## 2011-07-04 ENCOUNTER — Ambulatory Visit (INDEPENDENT_AMBULATORY_CARE_PROVIDER_SITE_OTHER): Payer: Medicare Other | Admitting: Internal Medicine

## 2011-07-04 VITALS — BP 120/84 | HR 62 | Temp 98.1°F | Ht 63.0 in | Wt 198.0 lb

## 2011-07-04 DIAGNOSIS — E785 Hyperlipidemia, unspecified: Secondary | ICD-10-CM

## 2011-07-04 DIAGNOSIS — R7309 Other abnormal glucose: Secondary | ICD-10-CM

## 2011-07-04 DIAGNOSIS — R7302 Impaired glucose tolerance (oral): Secondary | ICD-10-CM

## 2011-07-04 DIAGNOSIS — G8929 Other chronic pain: Secondary | ICD-10-CM

## 2011-07-04 DIAGNOSIS — H5440 Blindness, one eye, unspecified eye: Secondary | ICD-10-CM

## 2011-07-04 DIAGNOSIS — M545 Low back pain, unspecified: Secondary | ICD-10-CM | POA: Insufficient documentation

## 2011-07-04 DIAGNOSIS — Z0000 Encounter for general adult medical examination without abnormal findings: Secondary | ICD-10-CM

## 2011-07-04 DIAGNOSIS — N951 Menopausal and female climacteric states: Secondary | ICD-10-CM

## 2011-07-04 DIAGNOSIS — R232 Flushing: Secondary | ICD-10-CM | POA: Insufficient documentation

## 2011-07-04 HISTORY — DX: Impaired glucose tolerance (oral): R73.02

## 2011-07-04 HISTORY — DX: Blindness, one eye, unspecified eye: H54.40

## 2011-07-04 LAB — HEPATIC FUNCTION PANEL
ALT: 15 U/L (ref 0–35)
AST: 28 U/L (ref 0–37)
Albumin: 4.1 g/dL (ref 3.5–5.2)
Alkaline Phosphatase: 91 U/L (ref 39–117)
Bilirubin, Direct: 0.1 mg/dL (ref 0.0–0.3)
Total Bilirubin: 0.1 mg/dL — ABNORMAL LOW (ref 0.3–1.2)
Total Protein: 7.6 g/dL (ref 6.0–8.3)

## 2011-07-04 LAB — CBC WITH DIFFERENTIAL/PLATELET
Basophils Absolute: 0 10*3/uL (ref 0.0–0.1)
Basophils Relative: 0.4 % (ref 0.0–3.0)
Eosinophils Absolute: 0.2 10*3/uL (ref 0.0–0.7)
Eosinophils Relative: 2.7 % (ref 0.0–5.0)
HCT: 42.9 % (ref 36.0–46.0)
Hemoglobin: 14 g/dL (ref 12.0–15.0)
Lymphocytes Relative: 27.5 % (ref 12.0–46.0)
Lymphs Abs: 2.5 10*3/uL (ref 0.7–4.0)
MCHC: 32.7 g/dL (ref 30.0–36.0)
MCV: 89.8 fl (ref 78.0–100.0)
Monocytes Absolute: 1 10*3/uL (ref 0.1–1.0)
Monocytes Relative: 11.5 % (ref 3.0–12.0)
Neutro Abs: 5.2 10*3/uL (ref 1.4–7.7)
Neutrophils Relative %: 57.9 % (ref 43.0–77.0)
Platelets: 254 10*3/uL (ref 150.0–400.0)
RBC: 4.78 Mil/uL (ref 3.87–5.11)
RDW: 17.3 % — ABNORMAL HIGH (ref 11.5–14.6)
WBC: 9 10*3/uL (ref 4.5–10.5)

## 2011-07-04 LAB — URINALYSIS, ROUTINE W REFLEX MICROSCOPIC
Hgb urine dipstick: NEGATIVE
Leukocytes, UA: NEGATIVE
Nitrite: NEGATIVE
Specific Gravity, Urine: >=1.03 (ref 1.000–1.030)
Urine Glucose: NEGATIVE
Urobilinogen, UA: 1 (ref 0.0–1.0)
pH: 6 (ref 5.0–8.0)

## 2011-07-04 LAB — TSH: TSH: 0.78 u[IU]/mL (ref 0.35–5.50)

## 2011-07-04 LAB — BASIC METABOLIC PANEL
BUN: 14 mg/dL (ref 6–23)
CO2: 32 mEq/L (ref 19–32)
Calcium: 10 mg/dL (ref 8.4–10.5)
Chloride: 103 mEq/L (ref 96–112)
Creatinine, Ser: 0.8 mg/dL (ref 0.4–1.2)
GFR: 91.96 mL/min (ref >60.00)
Glucose, Bld: 94 mg/dL (ref 70–99)
Potassium: 4.2 mEq/L (ref 3.5–5.1)
Sodium: 142 mEq/L (ref 135–145)

## 2011-07-04 LAB — LIPID PANEL
Cholesterol: 137 mg/dL (ref 0–200)
HDL: 49.7 mg/dL (ref >39.00)
LDL Cholesterol: 62 mg/dL (ref 0–99)
Total CHOL/HDL Ratio: 3
Triglycerides: 129 mg/dL (ref 0.0–149.0)
VLDL: 25.8 mg/dL (ref 0.0–40.0)

## 2011-07-04 LAB — HEMOGLOBIN A1C: Hgb A1c MFr Bld: 6.4 % (ref 4.6–6.5)

## 2011-07-04 MED ORDER — METOPROLOL SUCCINATE ER 50 MG PO TB24: 50.0000 mg | ORAL_TABLET | Freq: Every day | ORAL | Status: DC

## 2011-07-04 MED ORDER — ROSUVASTATIN CALCIUM 10 MG PO TABS: 10.0000 mg | ORAL_TABLET | Freq: Every day | ORAL | Status: DC

## 2011-07-04 MED ORDER — NAPROXEN 500 MG PO TABS: 500.0000 mg | ORAL_TABLET | Freq: Every day | ORAL | Status: AC | PRN

## 2011-07-04 MED ORDER — AMLODIPINE BESYLATE 10 MG PO TABS: 10.0000 mg | ORAL_TABLET | Freq: Every day | ORAL | Status: DC

## 2011-07-04 MED ORDER — CLOPIDOGREL BISULFATE 75 MG PO TABS: 75.0000 mg | ORAL_TABLET | Freq: Every day | ORAL | Status: DC

## 2011-07-04 MED ORDER — FAMOTIDINE 40 MG PO TABS: 40.0000 mg | ORAL_TABLET | Freq: Every day | ORAL | Status: DC

## 2011-07-04 MED ORDER — LISINOPRIL 10 MG PO TABS: 10.0000 mg | ORAL_TABLET | Freq: Every day | ORAL | Status: DC

## 2011-07-04 MED ORDER — SERTRALINE HCL 50 MG PO TABS: 50.0000 mg | ORAL_TABLET | Freq: Every day | ORAL | Status: DC

## 2011-07-04 NOTE — Progress Notes (Signed)
Subjective:    Patient ID: Wendy Heath, female    DOB: 1951-01-20, 61 y.o.   MRN: [id number]  HPI  Here for wellness and f/u;  Overall doing ok;  Pt denies CP, worsening SOB, DOE, wheezing, orthopnea, PND, worsening LE edema, palpitations, dizziness or syncope.  Pt denies neurological change such as new Headache, facial or extremity weakness.  Pt denies polydipsia, polyuria, or low sugar symptoms. Pt states overall good compliance with treatment and medications, good tolerability, and trying to follow lower cholesterol diet.  Pt denies worsening depressive symptoms, suicidal ideation or panic. No fever, wt loss, night sweats, loss of appetite, or other constitutional symptoms.  Pt states good ability with ADL's, low fall risk, home safety reviewed and adequate, no significant changes in hearing or vision, and occasionally active with exercise.  Has some difficulty sleeping , frequent hot flashes, and recurrent mild LBP with standing too long during the day, none at night Past Medical History  Diagnosis Date  . Malignant neoplasm of breast (female), unspecified site 03/13/2011  . CEREBROVASCULAR ACCIDENT, HX OF 08/24/2006  . HYPERTENSION 08/24/2006  . HYPERLIPIDEMIA 08/24/2006  . GERD 08/24/2006  . Stroke   . Blind right eye 07/04/2011    Since stroke 2003  . Impaired glucose tolerance 07/04/2011   Past Surgical History  Procedure Date  . Abdominal hysterectomy 1995    reports that she has been smoking.  She does not have any smokeless tobacco history on file. She reports that she drinks alcohol. She reports that she does not use illicit drugs. family history includes Cancer in her others; Diabetes in her other; Hypertension in her other; Stroke in her others; and Sudden death in her other. No Known Allergies Current Outpatient Prescriptions on File Prior to Visit  Medication Sig Dispense Refill  . aspirin 81 MG tablet Take 81 mg by mouth daily.        . cholecalciferol (VITAMIN D) 400 UNITS  TABS Take by mouth.         Review of Systems Review of Systems  Constitutional: Negative for diaphoresis, activity change, appetite change and unexpected weight change.  HENT: Negative for hearing loss, ear pain, facial swelling, mouth sores and neck stiffness.   Eyes: Negative for pain, redness and visual disturbance.  Respiratory: Negative for shortness of breath and wheezing.   Cardiovascular: Negative for chest pain and palpitations.  Gastrointestinal: Negative for diarrhea, blood in stool, abdominal distention and rectal pain.  Genitourinary: Negative for hematuria, flank pain and decreased urine volume.  Musculoskeletal: Negative for myalgias and joint swelling.  Skin: Negative for color change and wound.  Neurological: Negative for syncope and numbness.  Hematological: Negative for adenopathy.  Psychiatric/Behavioral: Negative for hallucinations, self-injury, decreased concentration and agitation.      Objective:   Physical Exam BP 120/84  Pulse 62  Temp(Src) 98.1 F (36.7 C) (Oral)  Ht 5\' 3"  (1.6 m)  Wt 198 lb (89.812 kg)  BMI 35.07 kg/m2  SpO2 98% Physical Exam  VS noted Constitutional: Pt is oriented to person, place, and time. Appears well-developed and well-nourished.  HENT:  Head: Normocephalic and atraumatic.  Right Ear: External ear normal.  Left Ear: External ear normal.  Nose: Nose normal.  Mouth/Throat: Oropharynx is clear and moist.  Eyes: Conjunctivae and EOM are normal. Pupils are equal, round, and reactive to light.  Neck: Normal range of motion. Neck supple. No JVD present. No tracheal deviation present.  Cardiovascular: Normal rate, regular rhythm, normal heart sounds and  intact distal pulses.   Pulmonary/Chest: Effort normal and breath sounds normal.  Abdominal: Soft. Bowel sounds are normal. There is no tenderness.  Musculoskeletal: Normal range of motion. Exhibits no edema.  Lymphadenopathy:  Has no cervical adenopathy.  Spine  nontender  throughout Neurological: Pt is alert and oriented to person, place, and time. Pt has normal reflexes. No cranial nerve deficit. Motor/sens/dtr intact Skin: Skin is warm and dry. No rash noted.  Psychiatric:  Has  normal mood and affect. Behavior is normal.     Assessment & Plan:

## 2011-07-04 NOTE — Assessment & Plan Note (Signed)
?   Emotional vs hormonal vs other; to avoid estrogen in light of breast ca, for zoloft 50 qd,  to f/u any worsening symptoms or concerns

## 2011-07-04 NOTE — Assessment & Plan Note (Signed)
Asymtp, for a1c today,  to f/u any worsening symptoms or concerns  

## 2011-07-04 NOTE — Assessment & Plan Note (Signed)

## 2011-07-04 NOTE — Assessment & Plan Note (Addendum)
Not well defined previously, but mild to mod, worse with standing too long during the day, nonradicular, suspect some degree of lumbar disc dz , for naproxen qam prn only,  to f/u any worsening symptoms or concerns, hold on imaging at this time, exam o/w ok

## 2011-07-04 NOTE — Patient Instructions (Signed)
Take all new medications as prescribed  - the generic zoloft for the hot flashes, and the generic naproxen for the back pain Continue all other medications as before All of your prescriptions were refilled today to the pharmacy Please go to LAB in the Basement for the blood and/or urine tests to be done today Please call the phone number [phone] (the [employer] System) for results of testing in 2-3 days;  When calling, simply dial the number, and when prompted enter the MRN number above (the Medical Record Number) and the # key, then the message should start. If the phone number does not work, you should receive a letter in the mail in a few days, or you will be called if there is an urgent problem for which you need to be notified.

## 2011-07-24 ENCOUNTER — Other Ambulatory Visit: Payer: Self-pay | Admitting: Internal Medicine

## 2011-07-24 DIAGNOSIS — Z853 Personal history of malignant neoplasm of breast: Secondary | ICD-10-CM

## 2011-09-03 ENCOUNTER — Other Ambulatory Visit: Payer: Self-pay | Admitting: Physician Assistant

## 2011-09-03 DIAGNOSIS — C50919 Malignant neoplasm of unspecified site of unspecified female breast: Secondary | ICD-10-CM

## 2011-09-04 ENCOUNTER — Other Ambulatory Visit (HOSPITAL_BASED_OUTPATIENT_CLINIC_OR_DEPARTMENT_OTHER): Payer: Medicare Other | Admitting: Lab

## 2011-09-04 DIAGNOSIS — C50919 Malignant neoplasm of unspecified site of unspecified female breast: Secondary | ICD-10-CM

## 2011-09-04 LAB — COMPREHENSIVE METABOLIC PANEL
ALT: 13 U/L (ref 0–35)
AST: 15 U/L (ref 0–37)
Albumin: 4.1 g/dL (ref 3.5–5.2)
Alkaline Phosphatase: 99 U/L (ref 39–117)
BUN: 12 mg/dL (ref 6–23)
CO2: 26 mEq/L (ref 19–32)
Calcium: 9.6 mg/dL (ref 8.4–10.5)
Chloride: 104 mEq/L (ref 96–112)
Creatinine, Ser: 0.76 mg/dL (ref 0.50–1.10)
Glucose, Bld: 85 mg/dL (ref 70–99)
Potassium: 4 mEq/L (ref 3.5–5.3)
Sodium: 139 mEq/L (ref 135–145)
Total Bilirubin: 0.4 mg/dL (ref 0.3–1.2)
Total Protein: 6.8 g/dL (ref 6.0–8.3)

## 2011-09-04 LAB — CBC WITH DIFFERENTIAL/PLATELET
BASO%: 0.6 % (ref 0.0–2.0)
Basophils Absolute: 0 10*3/uL (ref 0.0–0.1)
EOS%: 4.3 % (ref 0.0–7.0)
Eosinophils Absolute: 0.3 10*3/uL (ref 0.0–0.5)
HCT: 41.4 % (ref 34.8–46.6)
HGB: 13.6 g/dL (ref 11.6–15.9)
LYMPH%: 31.9 % (ref 14.0–49.7)
MCH: 29.6 pg (ref 25.1–34.0)
MCHC: 32.7 g/dL (ref 31.5–36.0)
MCV: 90.4 fL (ref 79.5–101.0)
MONO#: 0.6 10*3/uL (ref 0.1–0.9)
MONO%: 9 % (ref 0.0–14.0)
NEUT#: 3.6 10*3/uL (ref 1.5–6.5)
NEUT%: 54.2 % (ref 38.4–76.8)
Platelets: 252 10*3/uL (ref 145–400)
RBC: 4.58 10*6/uL (ref 3.70–5.45)
RDW: 16.3 % — ABNORMAL HIGH (ref 11.2–14.5)
WBC: 6.7 10*3/uL (ref 3.9–10.3)
lymph#: 2.1 10*3/uL (ref 0.9–3.3)
nRBC: 0 % (ref 0–0)

## 2011-09-04 LAB — LACTATE DEHYDROGENASE: LDH: 156 U/L (ref 94–250)

## 2011-09-04 LAB — CANCER ANTIGEN 27.29: CA 27.29: 24 U/mL (ref 0–39)

## 2011-09-05 ENCOUNTER — Ambulatory Visit
Admission: RE | Admit: 2011-09-05 | Discharge: 2011-09-05 | Disposition: A | Discharge status: Home / Self Care | Payer: Medicare Other | Source: Ambulatory Visit | Attending: Internal Medicine | Admitting: Internal Medicine

## 2011-09-05 DIAGNOSIS — Z853 Personal history of malignant neoplasm of breast: Secondary | ICD-10-CM

## 2011-09-10 ENCOUNTER — Telehealth: Payer: Self-pay | Admitting: *Deleted

## 2011-09-10 NOTE — Telephone Encounter (Signed)
SPOKE WITH PATIENT ABOUT APPOINTMENT CHANGE. PT REPORTS THAT SHE HAS NO NEEDS AT THIS TIME. WILL GIVE NOTE TO SCHEDULERS TO R/S FIRST AVAIL.

## 2011-09-11 ENCOUNTER — Ambulatory Visit: Payer: Medicare HMO | Admitting: Oncology

## 2011-09-27 ENCOUNTER — Telehealth: Payer: Self-pay | Admitting: *Deleted

## 2011-09-27 NOTE — Telephone Encounter (Signed)
called patient left voice message to inform the patient of the new date and time on 10-04-2011 starting at 8:30am

## 2011-10-03 ENCOUNTER — Other Ambulatory Visit: Payer: Self-pay | Admitting: *Deleted

## 2011-10-03 DIAGNOSIS — C50919 Malignant neoplasm of unspecified site of unspecified female breast: Secondary | ICD-10-CM

## 2011-10-04 ENCOUNTER — Ambulatory Visit (HOSPITAL_BASED_OUTPATIENT_CLINIC_OR_DEPARTMENT_OTHER): Payer: Medicare Other | Admitting: Oncology

## 2011-10-04 ENCOUNTER — Telehealth: Payer: Self-pay | Admitting: *Deleted

## 2011-10-04 ENCOUNTER — Other Ambulatory Visit (HOSPITAL_BASED_OUTPATIENT_CLINIC_OR_DEPARTMENT_OTHER): Payer: Medicare Other | Admitting: Lab

## 2011-10-04 ENCOUNTER — Encounter: Payer: Self-pay | Admitting: Oncology

## 2011-10-04 VITALS — BP 126/79 | HR 64 | Temp 97.8°F | Ht 63.0 in | Wt 204.9 lb

## 2011-10-04 DIAGNOSIS — C50419 Malignant neoplasm of upper-outer quadrant of unspecified female breast: Secondary | ICD-10-CM

## 2011-10-04 DIAGNOSIS — Z170 Estrogen receptor positive status [ER+]: Secondary | ICD-10-CM

## 2011-10-04 DIAGNOSIS — C50919 Malignant neoplasm of unspecified site of unspecified female breast: Secondary | ICD-10-CM

## 2011-10-04 DIAGNOSIS — K5900 Constipation, unspecified: Secondary | ICD-10-CM

## 2011-10-04 LAB — COMPREHENSIVE METABOLIC PANEL
ALT: 12 U/L (ref 0–35)
AST: 16 U/L (ref 0–37)
Albumin: 3.9 g/dL (ref 3.5–5.2)
Alkaline Phosphatase: 108 U/L (ref 39–117)
BUN: 14 mg/dL (ref 6–23)
CO2: 25 mEq/L (ref 19–32)
Calcium: 9.4 mg/dL (ref 8.4–10.5)
Chloride: 105 mEq/L (ref 96–112)
Creatinine, Ser: 0.84 mg/dL (ref 0.50–1.10)
Glucose, Bld: 137 mg/dL — ABNORMAL HIGH (ref 70–99)
Potassium: 3.6 mEq/L (ref 3.5–5.3)
Sodium: 140 mEq/L (ref 135–145)
Total Bilirubin: 0.4 mg/dL (ref 0.3–1.2)
Total Protein: 6.6 g/dL (ref 6.0–8.3)

## 2011-10-04 LAB — CBC WITH DIFFERENTIAL/PLATELET
BASO%: 0.6 % (ref 0.0–2.0)
Basophils Absolute: 0 10*3/uL (ref 0.0–0.1)
EOS%: 3.4 % (ref 0.0–7.0)
Eosinophils Absolute: 0.2 10*3/uL (ref 0.0–0.5)
HCT: 42.9 % (ref 34.8–46.6)
HGB: 14 g/dL (ref 11.6–15.9)
LYMPH%: 28.3 % (ref 14.0–49.7)
MCH: 29.4 pg (ref 25.1–34.0)
MCHC: 32.5 g/dL (ref 31.5–36.0)
MCV: 90.3 fL (ref 79.5–101.0)
MONO#: 0.4 10*3/uL (ref 0.1–0.9)
MONO%: 5.3 % (ref 0.0–14.0)
NEUT#: 4.1 10*3/uL (ref 1.5–6.5)
NEUT%: 62.4 % (ref 38.4–76.8)
Platelets: 205 10*3/uL (ref 145–400)
RBC: 4.75 10*6/uL (ref 3.70–5.45)
RDW: 15.9 % — ABNORMAL HIGH (ref 11.2–14.5)
WBC: 6.6 10*3/uL (ref 3.9–10.3)
lymph#: 1.9 10*3/uL (ref 0.9–3.3)

## 2011-10-04 LAB — CANCER ANTIGEN 27.29: CA 27.29: 29 U/mL (ref 0–39)

## 2011-10-04 NOTE — Telephone Encounter (Signed)
gave patient appointment for 02-22-2012 at 8:00 am lab only 02-29-2012 at 9:30am for the md printed out calendar and gave to the patient

## 2011-10-04 NOTE — Progress Notes (Signed)
Hematology and Oncology Follow Up Visit  Wendy Heath [id number] 25-Jan-1951 61 y.o. 10/04/2011    HPI: Wendy Heath is a 61 year old [place] woman with a history of a locally advanced, triple negative, left breast carcinoma for which she received neoadjuvant chemotherapy comprised of 4 cycles of dose dense FEC, followed by 3 cycles of dose dense Taxotere. On 11/25/2006, she underwent a left lumpectomy with sentinel node dissection which revealed a residual 1.3 cm, nuclear grade 3 component of invasive ductal carcinoma with negative lymph node involvement, therefore staged as a ypT1c, ypNx. The tumor was felt to be essentially triple-negative with minimal positivity of the estrogen receptor 1%. She completed radiation therapy, and is on observation alone.  Interim History:   Wendy Heath is seen today for routine follow pertain to her history of a triple-negative left breast carcinoma. She underwent her annual mammogram on 09/05/2011 at the breast center, and it was clear. She denies any unexplained fevers, chills, or night sweats though she is experiencing quite a bit of hot flashes. She denies any nausea, emesis, diarrhea,  her appetite has been excellent.  She has experienced constipation for the last 4-5 days, this is unusual for her. She denies a diffuse bone pain. No shortness of breath or chest pain. She denies any headaches her profound vision changes. She denies any palpable breast masses.  A detailed review of systems is otherwise noncontributory as noted below.  Review of Systems: Constitutional:  no weight loss, fever, night sweats, feels well, but experiencing hot flashes. Eyes: no complaints ENT: no complaints Cardiovascular: no chest pain or dyspnea on exertion Respiratory: no cough, shortness of breath, or wheezing Neurological: no TIA or stroke symptoms Dermatological: negative Gastrointestinal: positive for - constipation Genito-Urinary: no dysuria, trouble  voiding, or hematuria Hematological and Lymphatic: negative Breast: negative Musculoskeletal: negative Remaining ROS negative.  Medications:   I have reviewed the patient's current medications.  Current Outpatient Prescriptions  Medication Sig Dispense Refill  . amLODipine (NORVASC) 10 MG tablet Take 1 tablet (10 mg total) by mouth daily.  90 tablet  3  . aspirin 81 MG tablet Take 81 mg by mouth daily.        . cholecalciferol (VITAMIN D) 400 UNITS TABS Take by mouth.        . clopidogrel (PLAVIX) 75 MG tablet Take 1 tablet (75 mg total) by mouth daily.  90 tablet  3  . famotidine (PEPCID) 40 MG tablet Take 1 tablet (40 mg total) by mouth daily.  90 tablet  3  . lisinopril (PRINIVIL,ZESTRIL) 10 MG tablet Take 1 tablet (10 mg total) by mouth daily.  90 tablet  3  . metoprolol succinate (TOPROL-XL) 50 MG 24 hr tablet Take 1 tablet (50 mg total) by mouth daily.  90 tablet  3  . naproxen (NAPROSYN) 500 MG tablet Take 1 tablet (500 mg total) by mouth daily as needed.  30 tablet  5  . rosuvastatin (CRESTOR) 10 MG tablet Take 1 tablet (10 mg total) by mouth daily.  90 tablet  3    Allergies: No Known Allergies  Physical Exam: Filed Vitals:   10/04/11 0845  BP: 126/79  Pulse: 64  Temp: 97.8 F (36.6 C)    Body mass index is 36.30 kg/(m^2). Weight: 204 lbs. HEENT:  Sclerae anicteric, conjunctivae pink.  Oropharynx clear.  No mucositis or candidiasis.   Nodes:  No cervical, supraclavicular, or axillary lymphadenopathy palpated.  Breast Exam:  The left breast was examined, lumpectomy  scar is benign without any dominant mass effect, no evidence of nipple inversion or discharge. The axilla is clear. The right breast is free of masses skin changes nipple inversion or discharge, the axilla is also clear.  Lungs:  Clear to auscultation bilaterally.  No crackles, rhonchi, or wheezes.   Heart:  Regular rate and rhythm.   Abdomen:  Soft, nontender.  Positive bowel sounds.  No organomegaly or  masses palpated.   Musculoskeletal:  No focal spinal tenderness to palpation.  Extremities:  Benign.  No peripheral edema or cyanosis.   Skin:  Benign.   Neuro:  Nonfocal, alert and oriented x 3   Lab Results: Lab Results  Component Value Date   WBC 6.6 10/04/2011   HGB 14.0 10/04/2011   HCT 42.9 10/04/2011   MCV 90.3 10/04/2011   PLT 205 10/04/2011   NEUTROABS 4.1 10/04/2011     Chemistry      Component Value Date/Time   NA 139 09/04/2011 1322   K 4.0 09/04/2011 1322   CL 104 09/04/2011 1322   CO2 26 09/04/2011 1322   BUN 12 09/04/2011 1322   CREATININE 0.76 09/04/2011 1322      Component Value Date/Time   CALCIUM 9.6 09/04/2011 1322   ALKPHOS 99 09/04/2011 1322   AST 15 09/04/2011 1322   ALT 13 09/04/2011 1322   BILITOT 0.4 09/04/2011 1322      Lab Results  Component Value Date   LABCA2 24 09/04/2011    Radiological Studies: Mm Digital Diagnostic Bilat 09/05/2011  ** RADIOLOGY REPORT **  Clinical Data:  61 year old female with history of left breast cancer and lumpectomy in 2008.  DIGITAL DIAGNOSTIC BILATERAL MAMMOGRAM with CAD  Comparison: 08/31/2010 and prior mammograms dating back to 06/05/2006.  Findings: MLO and CC views of both breasts and a spot tangential view of the lumpectomy site performed.  Scarring in the upper left breast again identified.  There is no evidence of mass, nonsurgical distortion, or suspicious calcifications. Scattered fibroglandular densities bilaterally are again noted.  Mammographic images were processed with CAD.  IMPRESSION: No specific mammographic evidence of malignancy.  Left breast scarring.  These findings were discussed with the patient.  She was encouraged to begin/continue monthly self exams and to contact her primary physician if any changes noted.  BI-RADS CATEGORY 2:  Benign finding(s).  Recommend bilateral diagnostic mammograms in 1 year.  Original Report Authenticated By: Rosendo Gros, M.D.     Assessment:  Wendy Heath is a 61 year old  [place] woman with a history of a locally advanced, triple negative, left breast carcinoma for which she received neoadjuvant chemotherapy comprised of 4 cycles of dose dense FEC, followed by 3 cycles of dose dense Taxotere. On 11/25/2006, she underwent a left lumpectomy with sentinel node dissection which revealed a residual 1.3 cm, nuclear grade 3 component of invasive ductal carcinoma with negative lymph node involvement, therefore staged as a ypT1c, ypNx. The tumor was felt to be essentially triple-negative with minimal positivity of the estrogen receptor 1%. She completed radiation therapy, and is on observation alone. No evidence to suggest recurrent or metastatic disease.  2. Constipation.  Case reviewed with Dr. Pierce Crane.  Plan:  For her history of breast cancer, Wendy Heath is doing quite well without evidence of recurrent disease. We will plan on seeing her back in 6 months for followup with appropriate pre-labs to include a CBC, serum chemistries, LDH, and CA 27-29. She has been given information on Pericidin-C, that  she can obtain via her local pharmacy that may help with her hot flashes, though this said, she was encouraged to contact us if this does not seem to help.  She will continue general medical care with Dr. Jonny Ruiz.   This plan was reviewed with the patient, who voices understanding and agreement.  She knows to call with any changes or problems.    Linden Tagliaferro T, PA-C 10/04/2011

## 2011-10-09 ENCOUNTER — Other Ambulatory Visit: Payer: Self-pay | Admitting: *Deleted

## 2011-10-09 MED ORDER — AMLODIPINE BESYLATE 10 MG PO TABS: 10.0000 mg | ORAL_TABLET | Freq: Every day | ORAL | Status: DC

## 2011-10-09 MED ORDER — LISINOPRIL 10 MG PO TABS: 10.0000 mg | ORAL_TABLET | Freq: Every day | ORAL | Status: DC

## 2011-10-09 MED ORDER — CLOPIDOGREL BISULFATE 75 MG PO TABS: 75.0000 mg | ORAL_TABLET | Freq: Every day | ORAL | Status: DC

## 2011-10-09 NOTE — Telephone Encounter (Signed)
R'cd fax from [facility] pharmacy for refill of Amlodipine, Plavix and Lisinopril.

## 2012-01-02 ENCOUNTER — Ambulatory Visit (INDEPENDENT_AMBULATORY_CARE_PROVIDER_SITE_OTHER): Payer: Medicare Other | Admitting: Internal Medicine

## 2012-01-02 ENCOUNTER — Encounter: Payer: Self-pay | Admitting: Internal Medicine

## 2012-01-02 VITALS — BP 132/80 | HR 81 | Temp 97.9°F | Ht 63.0 in | Wt 211.5 lb

## 2012-01-02 DIAGNOSIS — D179 Benign lipomatous neoplasm, unspecified: Secondary | ICD-10-CM | POA: Insufficient documentation

## 2012-01-02 DIAGNOSIS — R7309 Other abnormal glucose: Secondary | ICD-10-CM

## 2012-01-02 DIAGNOSIS — Z23 Encounter for immunization: Secondary | ICD-10-CM

## 2012-01-02 DIAGNOSIS — E785 Hyperlipidemia, unspecified: Secondary | ICD-10-CM

## 2012-01-02 DIAGNOSIS — C50919 Malignant neoplasm of unspecified site of unspecified female breast: Secondary | ICD-10-CM

## 2012-01-02 DIAGNOSIS — I10 Essential (primary) hypertension: Secondary | ICD-10-CM

## 2012-01-02 DIAGNOSIS — R7302 Impaired glucose tolerance (oral): Secondary | ICD-10-CM

## 2012-01-02 DIAGNOSIS — Z0000 Encounter for general adult medical examination without abnormal findings: Secondary | ICD-10-CM

## 2012-01-02 NOTE — Patient Instructions (Addendum)
You had the flu shot today Continue all other medications as before Please continue your efforts at being more active, low cholesterol diet, and weight control. Please keep your appointments with your specialists as you have planned - oncology Please call if you change your mind about the hand surgeon referral Please return in 6 mo with Lab testing done 3-5 days before

## 2012-01-02 NOTE — Progress Notes (Signed)
Subjective:    Patient ID: Wendy Heath, female    DOB: 08/30/1950, 61 y.o.   MRN: [id number]  HPI Here to f/u; overall doing ok,  Pt denies chest pain, increased sob or doe, wheezing, orthopnea, PND, increased LE swelling, palpitations, dizziness or syncope.  Pt denies new neurological symptoms such as new headache, or facial or extremity weakness or numbness   Pt denies polydipsia, polyuria, or low sugar symptoms such as weakness or confusion improved with po intake.  Pt states overall good compliance with meds, trying to follow lower cholesterol, diabetic diet, wt overall stable but little exercise however.  Staying with daugther recently, less exercise, wt up several lbs from 198 to 211, and sugar up slightly.  Does have lump to right hand she has not asked about before, ? slightly enlarged in past 6 mo. Past Medical History  Diagnosis Date  . Malignant neoplasm of breast (female), unspecified site 03/13/2011  . CEREBROVASCULAR ACCIDENT, HX OF 08/24/2006  . HYPERTENSION 08/24/2006  . HYPERLIPIDEMIA 08/24/2006  . GERD 08/24/2006  . Stroke   . Blind right eye 07/04/2011    Since stroke 2003  . Impaired glucose tolerance 07/04/2011   Past Surgical History  Procedure Date  . Abdominal hysterectomy 1995    reports that she has been smoking.  She does not have any smokeless tobacco history on file. She reports that she drinks alcohol. She reports that she does not use illicit drugs. family history includes Cancer in her others; Diabetes in her other; Hypertension in her other; Stroke in her others; and Sudden death in her other. No Known Allergies Current Outpatient Prescriptions on File Prior to Visit  Medication Sig Dispense Refill  . amLODipine (NORVASC) 10 MG tablet Take 1 tablet (10 mg total) by mouth daily.  90 tablet  2  . aspirin 81 MG tablet Take 81 mg by mouth daily.        . cholecalciferol (VITAMIN D) 400 UNITS TABS Take by mouth.        . clopidogrel (PLAVIX) 75 MG tablet Take 1  tablet (75 mg total) by mouth daily.  90 tablet  2  . famotidine (PEPCID) 40 MG tablet Take 1 tablet (40 mg total) by mouth daily.  90 tablet  3  . lisinopril (PRINIVIL,ZESTRIL) 10 MG tablet Take 1 tablet (10 mg total) by mouth daily.  90 tablet  2  . metoprolol succinate (TOPROL-XL) 50 MG 24 hr tablet Take 1 tablet (50 mg total) by mouth daily.  90 tablet  3  . naproxen (NAPROSYN) 500 MG tablet Take 1 tablet (500 mg total) by mouth daily as needed.  30 tablet  5  . rosuvastatin (CRESTOR) 10 MG tablet Take 1 tablet (10 mg total) by mouth daily.  90 tablet  3   Review of Systems  Constitutional: Negative for diaphoresis and unexpected weight change.  HENT: Negative for tinnitus.   Eyes: Negative for photophobia and visual disturbance.  Respiratory: Negative for choking and stridor.   Gastrointestinal: Negative for vomiting and blood in stool.  Genitourinary: Negative for hematuria and decreased urine volume.  Musculoskeletal: Negative for gait problem.  Skin: Negative for color change and wound.  Neurological: Negative for tremors and numbness.  Psychiatric/Behavioral: Negative for decreased concentration. The patient is not hyperactive.       Objective:   Physical Exam BP 132/80  Pulse 81  Temp 97.9 F (36.6 C) (Oral)  Ht 5\' 3"  (1.6 m)  Wt 211 lb 8 oz (  95.936 kg)  BMI 37.47 kg/m2  SpO2 98% Physical Exam  VS noted Constitutional: Pt appears well-developed and well-nourished.  HENT: Head: Normocephalic.  Right Ear: External ear normal.  Left Ear: External ear normal.  Eyes: Conjunctivae and EOM are normal. Pupils are equal, round, and reactive to light.  Neck: Normal range of motion. Neck supple.  Cardiovascular: Normal rate and regular rhythm.   Pulmonary/Chest: Effort normal and breath sounds normal.  Abd:  Soft, NT, non-distended, + BS Neurological: Pt is alert. Not confused  Skin: Skin is warm. No erythema. right hand with lipoma like mass in webspace near base of  thumb Psychiatric: Pt behavior is normal. Thought content normal.     Assessment & Plan:

## 2012-01-05 ENCOUNTER — Encounter: Payer: Self-pay | Admitting: Internal Medicine

## 2012-01-05 NOTE — Assessment & Plan Note (Signed)
stable overall by hx and exam, most recent data reviewed with pt, and pt to continue medical treatment as before BP Readings from Last 3 Encounters:  01/02/12 132/80  10/04/11 126/79  07/04/11 120/84

## 2012-01-05 NOTE — Assessment & Plan Note (Signed)
Right hand, d/w pt benign nature, for hand surgury referral but declines at this time

## 2012-01-05 NOTE — Assessment & Plan Note (Signed)
stable overall by hx and exam, most recent data reviewed with pt, and pt to continue medical treatment as before Lab Results  Component Value Date   HGBA1C 6.4 07/04/2011

## 2012-01-05 NOTE — Assessment & Plan Note (Signed)
stable overall by hx and exam, most recent data reviewed with pt, and pt to continue medical treatment as before Lab Results  Component Value Date   LDLCALC 62 07/04/2011

## 2012-02-22 ENCOUNTER — Other Ambulatory Visit: Payer: Medicare Other | Admitting: Lab

## 2012-02-25 ENCOUNTER — Other Ambulatory Visit: Payer: Self-pay | Admitting: *Deleted

## 2012-02-25 ENCOUNTER — Ambulatory Visit (HOSPITAL_BASED_OUTPATIENT_CLINIC_OR_DEPARTMENT_OTHER): Payer: Medicare Other | Admitting: Lab

## 2012-02-25 DIAGNOSIS — C50919 Malignant neoplasm of unspecified site of unspecified female breast: Secondary | ICD-10-CM

## 2012-02-25 LAB — CBC WITH DIFFERENTIAL/PLATELET
BASO%: 0.7 % (ref 0.0–2.0)
Basophils Absolute: 0 10*3/uL (ref 0.0–0.1)
EOS%: 3 % (ref 0.0–7.0)
Eosinophils Absolute: 0.2 10*3/uL (ref 0.0–0.5)
HCT: 41.5 % (ref 34.8–46.6)
HGB: 13.8 g/dL (ref 11.6–15.9)
LYMPH%: 30.1 % (ref 14.0–49.7)
MCH: 30 pg (ref 25.1–34.0)
MCHC: 33.2 g/dL (ref 31.5–36.0)
MCV: 90.3 fL (ref 79.5–101.0)
MONO#: 0.7 10*3/uL (ref 0.1–0.9)
MONO%: 10.8 % (ref 0.0–14.0)
NEUT#: 3.7 10*3/uL (ref 1.5–6.5)
NEUT%: 55.4 % (ref 38.4–76.8)
Platelets: 202 10*3/uL (ref 145–400)
RBC: 4.6 10*6/uL (ref 3.70–5.45)
RDW: 16 % — ABNORMAL HIGH (ref 11.2–14.5)
WBC: 6.7 10*3/uL (ref 3.9–10.3)
lymph#: 2 10*3/uL (ref 0.9–3.3)

## 2012-02-25 LAB — COMPREHENSIVE METABOLIC PANEL (CC13)
ALT: 12 U/L (ref 0–55)
AST: 15 U/L (ref 5–34)
Albumin: 3.8 g/dL (ref 3.5–5.0)
Alkaline Phosphatase: 106 U/L (ref 40–150)
BUN: 11 mg/dL (ref 7.0–26.0)
CO2: 27 mEq/L (ref 22–29)
Calcium: 9.6 mg/dL (ref 8.4–10.4)
Chloride: 106 mEq/L (ref 98–107)
Creatinine: 0.7 mg/dL (ref 0.6–1.1)
Glucose: 96 mg/dl (ref 70–99)
Potassium: 3.6 mEq/L (ref 3.5–5.1)
Sodium: 141 mEq/L (ref 136–145)
Total Bilirubin: 0.58 mg/dL (ref 0.20–1.20)
Total Protein: 6.9 g/dL (ref 6.4–8.3)

## 2012-02-25 LAB — LACTATE DEHYDROGENASE (CC13): LDH: 224 U/L — ABNORMAL HIGH (ref 125–220)

## 2012-02-25 LAB — CANCER ANTIGEN 27.29: CA 27.29: 24 U/mL (ref 0–39)

## 2012-02-29 ENCOUNTER — Telehealth: Payer: Self-pay | Admitting: *Deleted

## 2012-02-29 ENCOUNTER — Ambulatory Visit (HOSPITAL_BASED_OUTPATIENT_CLINIC_OR_DEPARTMENT_OTHER): Payer: Medicare Other | Admitting: Oncology

## 2012-02-29 VITALS — BP 162/94 | HR 76 | Temp 97.4°F | Resp 20 | Ht 63.0 in | Wt 213.5 lb

## 2012-02-29 DIAGNOSIS — C50919 Malignant neoplasm of unspecified site of unspecified female breast: Secondary | ICD-10-CM

## 2012-02-29 DIAGNOSIS — C50419 Malignant neoplasm of upper-outer quadrant of unspecified female breast: Secondary | ICD-10-CM

## 2012-02-29 NOTE — Telephone Encounter (Signed)
Made patient appointment for mammogram at the breast center 09-05-2012 at 10:00am  Made patient appointment for one year in 2014

## 2012-02-29 NOTE — Progress Notes (Signed)
Hematology and Oncology Follow Up Visit  Wendy Heath [id number] 24-Aug-1950 61 y.o. 02/29/2012 10:02 AM   Principle Diagnosis: Locally advanced [place] left breast cancer, completion of neoadjuvant chemotherapy, followed by lumpectomy and sentinel lymph node evaluation on 11/25/2006 followed by radiation  Interim History:  Patient feels well has no current problems general medical health is good. Medications are used as before. She's been having some hot flashes more severely recently. She does note some tearing of her eyes poorly. She is on multiple different medications including those for high blood pressure. Blood pressure overall is under fairly good control.  Medications: I have reviewed the patient's current medications.  Allergies: No Known Allergies  Past Medical History, Surgical history, Social history, and Family History were reviewed and updated.  Review of Systems: Constitutional:  Negative for fever, chills, night sweats, anorexia, weight loss, pain. Cardiovascular: no chest pain or dyspnea on exertion Respiratory: no cough, shortness of breath, or wheezing Neurological: negative Dermatological: negative ENT: negative Skin Gastrointestinal: negative Genito-Urinary: negative Hematological and Lymphatic: negative Breast: negative for breast lumps Musculoskeletal: negative Remaining ROS negative.  Physical Exam: Blood pressure 162/94, pulse 76, temperature 97.4 F (36.3 C), resp. rate 20, height 5\' 3"  (1.6 m), weight 213 lb 8 oz (96.843 kg). ECOG:  General appearance: alert, cooperative and appears stated age Head: Normocephalic, without obvious abnormality, atraumatic Neck: no adenopathy, no carotid bruit, no JVD, supple, symmetrical, trachea midline and thyroid not enlarged, symmetric, no tenderness/mass/nodules Lymph nodes: Cervical, supraclavicular, and axillary nodes normal. Cardiac : Normal heart sounds  Pulmonary: Normal breath sounds Breasts: Large pendulous  breasts with no obvious masses, left lumpectomy scar healed well. No nipple retraction or other skin change Abdomen: No masses or organomegaly Extremities normal Neuro: Normal  Lab Results: Lab Results  Component Value Date   WBC 6.7 02/25/2012   HGB 13.8 02/25/2012   HCT 41.5 02/25/2012   MCV 90.3 02/25/2012   PLT 202 02/25/2012     Chemistry      Component Value Date/Time   NA 141 02/25/2012 1005   NA 140 10/04/2011 0815   K 3.6 02/25/2012 1005   K 3.6 10/04/2011 0815   CL 106 02/25/2012 1005   CL 105 10/04/2011 0815   CO2 27 02/25/2012 1005   CO2 25 10/04/2011 0815   BUN 11.0 02/25/2012 1005   BUN 14 10/04/2011 0815   CREATININE 0.7 02/25/2012 1005   CREATININE 0.84 10/04/2011 0815      Component Value Date/Time   CALCIUM 9.6 02/25/2012 1005   CALCIUM 9.4 10/04/2011 0815   ALKPHOS 106 02/25/2012 1005   ALKPHOS 108 10/04/2011 0815   AST 15 02/25/2012 1005   AST 16 10/04/2011 0815   ALT 12 02/25/2012 1005   ALT 12 10/04/2011 0815   BILITOT 0.58 02/25/2012 1005   BILITOT 0.4 10/04/2011 0815       Radiological Studies: chest X-ray n/a Mammogram Due 5/13 Bone density n/a  Impression and Plan: The patient is doing well. There is no evidence for recurrence. I will see her in 12 months for followup. I will see her in a years time. She has not been exercising is much encouraged to do so at your weight under control. Her blood pressure Lovenox and she'll continue to follow that she thinks that is more a function of being in the office today. Once again I will see her in a years time with appropriate imaging studies and labs. I have reviewed her labs today. More than 50%  of the visit was spent in patient-related counselling   Pierce Crane, MD 11/8/201310:02 AM

## 2012-02-29 NOTE — Patient Instructions (Addendum)
PERIDIN C 2 PILLS , 3 TIMES PER DAY.

## 2012-05-14 ENCOUNTER — Encounter: Payer: Self-pay | Admitting: Internal Medicine

## 2012-05-14 ENCOUNTER — Telehealth: Payer: Self-pay | Admitting: Internal Medicine

## 2012-05-14 ENCOUNTER — Ambulatory Visit (INDEPENDENT_AMBULATORY_CARE_PROVIDER_SITE_OTHER): Payer: Medicare Other | Admitting: Internal Medicine

## 2012-05-14 VITALS — BP 120/82 | HR 70 | Temp 98.3°F | Ht 64.0 in | Wt 212.0 lb

## 2012-05-14 DIAGNOSIS — I10 Essential (primary) hypertension: Secondary | ICD-10-CM

## 2012-05-14 DIAGNOSIS — H1045 Other chronic allergic conjunctivitis: Secondary | ICD-10-CM

## 2012-05-14 DIAGNOSIS — R7309 Other abnormal glucose: Secondary | ICD-10-CM

## 2012-05-14 DIAGNOSIS — R7302 Impaired glucose tolerance (oral): Secondary | ICD-10-CM

## 2012-05-14 DIAGNOSIS — K047 Periapical abscess without sinus: Secondary | ICD-10-CM

## 2012-05-14 DIAGNOSIS — J302 Other seasonal allergic rhinitis: Secondary | ICD-10-CM | POA: Insufficient documentation

## 2012-05-14 DIAGNOSIS — H1010 Acute atopic conjunctivitis, unspecified eye: Secondary | ICD-10-CM

## 2012-05-14 HISTORY — DX: Other seasonal allergic rhinitis: J30.2

## 2012-05-14 MED ORDER — AMOXICILLIN 500 MG PO CAPS: 1000.0000 mg | ORAL_CAPSULE | Freq: Two times a day (BID) | ORAL | Status: DC

## 2012-05-14 NOTE — Telephone Encounter (Signed)
Patient Information:  Caller Name: Julane  Phone: [phone]  Patient: Wendy Heath, Wendy Heath  Gender: Female  DOB: February 07, 1951  Age: 62 Years  PCP: [name] (Adults only)  Office Follow Up:  Does the office need to follow up with this patient?: No  Instructions For The Office: N/A  RN Note:  Pt was requesting medication to be called in; RN advised pt she should be seen in the office for medication.  Symptoms  Reason For Call & Symptoms: pt reports she has an abcess on her left lower tooth; pt has called dentist and because she is on blood thinner she would need to be off of it before it could be pulled  Reviewed Health History In EMR: Yes  Reviewed Medications In EMR: Yes  Reviewed Allergies In EMR: Yes  Reviewed Surgeries / Procedures: Yes  Date of Onset of Symptoms: 05/12/2012  Treatments Tried: gargling salt water  Treatments Tried Worked: No  Guideline(s) Used:  Toothache  Disposition Per Guideline:   Go to Office Now  Reason For Disposition Reached:   Face is swollen  Advice Given:  Pain Medicines:  For pain relief, you can take either acetaminophen, ibuprofen, or naproxen.  Call Your Dentist If:  The toothache becomes worse  Appointment Scheduled:  05/14/2012 15:30:00 Appointment Scheduled Provider:  [name] (Adults only)

## 2012-05-14 NOTE — Assessment & Plan Note (Signed)
Mild, exam ok today, presumed by hx, for otc zyrtec prn,  to f/u any worsening symptoms or concerns or consider f/u with opthomology as well

## 2012-05-14 NOTE — Patient Instructions (Addendum)
Please take all new medication as prescribed Please continue all other medications as before, and refills have been done if requested. You can also use OTC zyrtec for the allergies and eye watering to see if this can help Please have the pharmacy call with any other refills you may need. Thank you for enrolling in MyChart. Please follow the instructions below to securely access your online medical record. MyChart allows you to send messages to your doctor, view your test results, renew your prescriptions, schedule appointments, and more. To Log into My Chart online, please go by [employer] or [employer] to [email or web address], or download the MyChart App from the [employer] of [employer] .  Your Username is: [email or web address] ([email or web address]) Please send a practice Message on Mychart later today. Please keep your appointments with your specialists as you have planned

## 2012-05-14 NOTE — Progress Notes (Signed)
Subjective:    Patient ID: Wendy Heath, female    DOB: 21-Mar-1951, 62 y.o.   MRN: [id number]  HPI  Here as seeing her dentist would be > $200 on emergency; a crown came off the left lower jaw eyetooth and now with 2-3 days red/swelling/tender gum with assoc jaw pain and swelling, low grade temp, mild HA, general malaise and weakness,  But no ST, cough, chills, drainage, ear or sinus symptoms.  Has had nearly year-round eye watering and itching which has really bothered her but not tried any otc med such as zyrtec.  Pt denies chest pain, increased sob or doe, wheezing, orthopnea, PND, increased LE swelling, palpitations, dizziness or syncope.  Pt denies polydipsia, polyuria Past Medical History  Diagnosis Date  . Malignant neoplasm of breast (female), unspecified site 03/13/2011  . CEREBROVASCULAR ACCIDENT, HX OF 08/24/2006  . HYPERTENSION 08/24/2006  . HYPERLIPIDEMIA 08/24/2006  . GERD 08/24/2006  . Stroke   . Blind right eye 07/04/2011    Since stroke 2003  . Impaired glucose tolerance 07/04/2011   Past Surgical History  Procedure Date  . Abdominal hysterectomy 1995    reports that she has been smoking.  She does not have any smokeless tobacco history on file. She reports that she drinks alcohol. She reports that she does not use illicit drugs. family history includes Cancer in her others; Diabetes in her other; Hypertension in her other; Stroke in her others; and Sudden death in her other. No Known Allergies Current Outpatient Prescriptions on File Prior to Visit  Medication Sig Dispense Refill  . amLODipine (NORVASC) 10 MG tablet Take 1 tablet (10 mg total) by mouth daily.  90 tablet  2  . aspirin 81 MG tablet Take 81 mg by mouth daily.        . cholecalciferol (VITAMIN D) 400 UNITS TABS Take by mouth.        . clopidogrel (PLAVIX) 75 MG tablet Take 1 tablet (75 mg total) by mouth daily.  90 tablet  2  . famotidine (PEPCID) 40 MG tablet Take 1 tablet (40 mg total) by mouth daily.  90  tablet  3  . lisinopril (PRINIVIL,ZESTRIL) 10 MG tablet Take 1 tablet (10 mg total) by mouth daily.  90 tablet  2  . metoprolol succinate (TOPROL-XL) 50 MG 24 hr tablet Take 1 tablet (50 mg total) by mouth daily.  90 tablet  3  . naproxen (NAPROSYN) 500 MG tablet Take 1 tablet (500 mg total) by mouth daily as needed.  30 tablet  5  . rosuvastatin (CRESTOR) 10 MG tablet Take 1 tablet (10 mg total) by mouth daily.  90 tablet  3   Review of Systems  Constitutional: Negative for unexpected weight change, or unusual diaphoresis  HENT: Negative for tinnitus.   Eyes: Negative for photophobia and visual disturbance.  Respiratory: Negative for choking and stridor.   Gastrointestinal: Negative for vomiting and blood in stool.  Genitourinary: Negative for hematuria and decreased urine volume.  Musculoskeletal: Negative for acute joint swelling Skin: Negative for color change and wound.  Neurological: Negative for tremors and numbness other than noted  Psychiatric/Behavioral: Negative for decreased concentration or  hyperactivity.       Objective:   Physical Exam BP 120/82  Pulse 70  Temp 98.3 F (36.8 C) (Oral)  Ht 5\' 4"  (1.626 m)  Wt 212 lb (96.163 kg)  BMI 36.39 kg/m2  SpO2 98% VS noted, non toxic, mild ill, hand over left lower jaw near  chin Constitutional: Pt appears well-developed and well-nourished.  HENT: Head: NCAT.  Right Ear: External ear normal.  Left Ear: External ear normal.  Left lower gum/eyetooth/jaw with 1-2+ erythema/swelling/tender without fluctuance, drainage, ulcer Eyes: Conjunctivae and EOM are normal. Pupils are equal, round, and reactive to light.  Neck: Normal range of motion. Neck supple.  Cardiovascular: Normal rate and regular rhythm.   Pulmonary/Chest: Effort normal and breath sounds normal.  Neurological: Pt is alert. Not confused  Skin: Skin is warm. No erythema.  Psychiatric: Pt behavior is normal. Thought content normal.     Assessment & Plan:

## 2012-05-14 NOTE — Assessment & Plan Note (Signed)
stable overall by history and exam, recent data reviewed with pt, and pt to continue medical treatment as before,  to f/u any worsening symptoms or concerns BP Readings from Last 3 Encounters:  05/14/12 120/82  02/29/12 162/94  01/02/12 132/80

## 2012-05-14 NOTE — Assessment & Plan Note (Signed)
Mild to mod, ok for antibx, to oral surgeon if worsening, .follwoj

## 2012-05-14 NOTE — Assessment & Plan Note (Signed)
stable overall by history and exam, recent data reviewed with pt, and pt to continue medical treatment as before,  to f/u any worsening symptoms or concerns Lab Results  Component Value Date   HGBA1C 6.4 07/04/2011

## 2012-05-15 ENCOUNTER — Encounter: Payer: Self-pay | Admitting: Internal Medicine

## 2012-05-28 ENCOUNTER — Encounter: Payer: Self-pay | Admitting: Internal Medicine

## 2012-05-29 ENCOUNTER — Other Ambulatory Visit: Payer: Self-pay | Admitting: Internal Medicine

## 2012-05-29 NOTE — Telephone Encounter (Signed)
Pt had asked for vicodin refill, but this is not a chronic med for her on my review of her med lis

## 2012-06-11 ENCOUNTER — Ambulatory Visit (INDEPENDENT_AMBULATORY_CARE_PROVIDER_SITE_OTHER): Payer: Medicare Other | Admitting: Internal Medicine

## 2012-06-11 ENCOUNTER — Encounter: Payer: Self-pay | Admitting: Internal Medicine

## 2012-06-11 VITALS — BP 118/84 | HR 72 | Temp 97.1°F | Ht 64.0 in | Wt 216.0 lb

## 2012-06-11 DIAGNOSIS — T148XXA Other injury of unspecified body region, initial encounter: Secondary | ICD-10-CM

## 2012-06-11 MED ORDER — CYCLOBENZAPRINE HCL 10 MG PO TABS: 10.0000 mg | ORAL_TABLET | Freq: Three times a day (TID) | ORAL | Status: DC | PRN

## 2012-06-11 NOTE — Patient Instructions (Signed)
Muscle Strain °Muscle strain occurs when a muscle is stretched beyond its normal length. A small number of muscle fibers generally are torn. This is especially common in athletes. This happens when a sudden, violent force placed on a muscle stretches it too far. Usually, recovery from muscle strain takes 1 to 2 weeks. Complete healing will take 5 to 6 weeks.  °HOME CARE INSTRUCTIONS  °· While awake, apply ice to the sore muscle for the first 2 days after the injury. °· Put ice in a plastic bag. °· Place a towel between your skin and the bag. °· Leave the ice on for 15 to 20 minutes each hour. °· Do not use the strained muscle for several days, until you no longer have pain. °· You may wrap the injured area with an elastic bandage for comfort. Be careful not to wrap it too tightly. This may interfere with blood circulation or increase swelling. °· Only take over-the-counter or prescription medicines for pain, discomfort, or fever as directed by your caregiver. °SEEK MEDICAL CARE IF:  °You have increasing pain or swelling in the injured area. °MAKE SURE YOU:  °· Understand these instructions. °· Will watch your condition. °· Will get help right away if you are not doing well or get worse. °Document Released: 04/09/2005 Document Revised: 07/02/2011 Document Reviewed: 04/21/2011 °[employer]® Patient Information ©2013 [employer]. ° °

## 2012-06-11 NOTE — Progress Notes (Signed)
Subjective:    Patient ID: Wendy Heath, female    DOB: Aug 12, 1950, 61 y.o.   MRN: [id number]  HPI  Pt presents to the clinic today with c/o back pain. This is a chronic problem for her. She has more pain when she bends down or twists. She denies a recent injury to her back which may have precipitated this. She denies loss of bowel or bladder habits. She denies sharp, shooting pains that run into her legs. She has been taking Naprosyn which does help ease the pain. She is having difficulty sleepind due to the pain.  Review of Systems      Past Medical History  Diagnosis Date  . Malignant neoplasm of breast (female), unspecified site 03/13/2011  . CEREBROVASCULAR ACCIDENT, HX OF 08/24/2006  . HYPERTENSION 08/24/2006  . HYPERLIPIDEMIA 08/24/2006  . GERD 08/24/2006  . Stroke   . Blind right eye 07/04/2011    Since stroke 2003  . Impaired glucose tolerance 07/04/2011    Current Outpatient Prescriptions  Medication Sig Dispense Refill  . amLODipine (NORVASC) 10 MG tablet Take 1 tablet (10 mg total) by mouth daily.  90 tablet  2  . aspirin 81 MG tablet Take 81 mg by mouth daily.        . cholecalciferol (VITAMIN D) 400 UNITS TABS Take by mouth.        . clopidogrel (PLAVIX) 75 MG tablet Take 1 tablet (75 mg total) by mouth daily.  90 tablet  2  . famotidine (PEPCID) 40 MG tablet Take 1 tablet (40 mg total) by mouth daily.  90 tablet  3  . lisinopril (PRINIVIL,ZESTRIL) 10 MG tablet Take 1 tablet (10 mg total) by mouth daily.  90 tablet  2  . metoprolol succinate (TOPROL-XL) 50 MG 24 hr tablet Take 1 tablet (50 mg total) by mouth daily.  90 tablet  3  . naproxen (NAPROSYN) 500 MG tablet Take 1 tablet (500 mg total) by mouth daily as needed.  30 tablet  5  . rosuvastatin (CRESTOR) 10 MG tablet Take 1 tablet (10 mg total) by mouth daily.  90 tablet  3  . cyclobenzaprine (FLEXERIL) 10 MG tablet Take 1 tablet (10 mg total) by mouth 3 (three) times daily as needed for muscle spasms.  30 tablet  0    No current facility-administered medications for this visit.    No Known Allergies  Family History  Problem Relation Age of Onset  . Stroke Other   . Cancer Other     breast cancer  . Cancer Other     breast cancer  . Stroke Other   . Hypertension Other   . Diabetes Other   . Sudden death Other     History   Social History  . Marital Status: Divorced    Spouse Name: N/A    Number of Children: N/A  . Years of Education: 12   Occupational History  .     Social History Main Topics  . Smoking status: Current Every Day Smoker  . Smokeless tobacco: Not on file  . Alcohol Use: Yes  . Drug Use: No  . Sexually Active: Not on file   Other Topics Concern  . Not on file   Social History Narrative  . No narrative on file     Constitutional: Denies fever, malaise, fatigue, headache or abrupt weight changes.  Musculoskeletal: Pt reports back pain. Denies decrease in range of motion, difficulty with gait, or joint pain and swelling.  Neurological: Denies dizziness, difficulty with memory, difficulty with speech or problems with balance and coordination.   No other specific complaints in a complete review of systems (except as listed in HPI above).  Objective:   Physical Exam   BP 118/84  Pulse 72  Temp(Src) 97.1 F (36.2 C) (Oral)  Ht 5\' 4"  (1.626 m)  Wt 216 lb (97.977 kg)  BMI 37.06 kg/m2  SpO2 97% Wt Readings from Last 3 Encounters:  06/11/12 216 lb (97.977 kg)  05/14/12 212 lb (96.163 kg)  02/29/12 213 lb 8 oz (96.843 kg)    General: Appears her stated age, well developed, well nourished in NAD. Cardiovascular: Normal rate and rhythm. S1,S2 noted.  No murmur, rubs or gallops noted. No JVD or BLE edema. No carotid bruits noted. Pulmonary/Chest: Normal effort and positive vesicular breath sounds. No respiratory distress. No wheezes, rales or ronchi noted.  Musculoskeletal: Decreased flexion and extension range of motion of the back secondary to pain. No  signs of joint swelling. No difficulty with gait.  Neurological: Alert and oriented. Cranial nerves II-XII intact. Coordination normal. +DTRs bilaterally.       Assessment & Plan:   Muscle Strain of back, new onset with additional workup required:  Perform stretching exercises as indicated on handout Continue Naprosyn daily eRx for Flexeril TID prn Can use a heating pad on the affected area.   RTC as needed or if symptoms persist

## 2012-07-02 ENCOUNTER — Ambulatory Visit (INDEPENDENT_AMBULATORY_CARE_PROVIDER_SITE_OTHER): Payer: Medicare Other | Admitting: Internal Medicine

## 2012-07-02 ENCOUNTER — Other Ambulatory Visit (INDEPENDENT_AMBULATORY_CARE_PROVIDER_SITE_OTHER): Payer: Medicare Other

## 2012-07-02 ENCOUNTER — Encounter: Payer: Self-pay | Admitting: Internal Medicine

## 2012-07-02 VITALS — BP 110/60 | HR 73 | Temp 97.9°F | Ht 63.0 in | Wt 213.2 lb

## 2012-07-02 DIAGNOSIS — E785 Hyperlipidemia, unspecified: Secondary | ICD-10-CM

## 2012-07-02 DIAGNOSIS — R7302 Impaired glucose tolerance (oral): Secondary | ICD-10-CM

## 2012-07-02 DIAGNOSIS — Z0000 Encounter for general adult medical examination without abnormal findings: Secondary | ICD-10-CM

## 2012-07-02 DIAGNOSIS — R7309 Other abnormal glucose: Secondary | ICD-10-CM

## 2012-07-02 DIAGNOSIS — Z2911 Encounter for prophylactic immunotherapy for respiratory syncytial virus (RSV): Secondary | ICD-10-CM

## 2012-07-02 DIAGNOSIS — I10 Essential (primary) hypertension: Secondary | ICD-10-CM

## 2012-07-02 DIAGNOSIS — M25519 Pain in unspecified shoulder: Secondary | ICD-10-CM

## 2012-07-02 DIAGNOSIS — Z23 Encounter for immunization: Secondary | ICD-10-CM

## 2012-07-02 DIAGNOSIS — M25511 Pain in right shoulder: Secondary | ICD-10-CM | POA: Insufficient documentation

## 2012-07-02 LAB — BASIC METABOLIC PANEL
BUN: 12 mg/dL (ref 6–23)
CO2: 28 mEq/L (ref 19–32)
Calcium: 9.4 mg/dL (ref 8.4–10.5)
Chloride: 105 mEq/L (ref 96–112)
Creatinine, Ser: 0.8 mg/dL (ref 0.4–1.2)
GFR: 90.37 mL/min (ref >60.00)
Glucose, Bld: 107 mg/dL — ABNORMAL HIGH (ref 70–99)
Potassium: 4.1 mEq/L (ref 3.5–5.1)
Sodium: 140 mEq/L (ref 135–145)

## 2012-07-02 LAB — LIPID PANEL
Cholesterol: 135 mg/dL (ref 0–200)
HDL: 40.6 mg/dL (ref >39.00)
LDL Cholesterol: 68 mg/dL (ref 0–99)
Total CHOL/HDL Ratio: 3
Triglycerides: 131 mg/dL (ref 0.0–149.0)
VLDL: 26.2 mg/dL (ref 0.0–40.0)

## 2012-07-02 LAB — URINALYSIS, ROUTINE W REFLEX MICROSCOPIC
Bilirubin Urine: NEGATIVE
Ketones, ur: NEGATIVE
Leukocytes, UA: NEGATIVE
Nitrite: NEGATIVE
Specific Gravity, Urine: 1.02 (ref 1.000–1.030)
Urine Glucose: NEGATIVE
Urobilinogen, UA: 0.2 (ref 0.0–1.0)
pH: 6.5 (ref 5.0–8.0)

## 2012-07-02 LAB — TSH: TSH: 1.05 u[IU]/mL (ref 0.35–5.50)

## 2012-07-02 LAB — HEMOGLOBIN A1C: Hgb A1c MFr Bld: 6.5 % (ref 4.6–6.5)

## 2012-07-02 NOTE — Assessment & Plan Note (Signed)
Suspect underlying right shoulder joint DJD, for ortho referral

## 2012-07-02 NOTE — Progress Notes (Signed)
Subjective:    Patient ID: Wendy Heath, female    DOB: 1950/09/15, 62 y.o.   MRN: [id number]  HPI  Here for wellness and f/u;  Overall doing ok;  Pt denies CP, worsening SOB, DOE, wheezing, orthopnea, PND, worsening LE edema, palpitations, dizziness or syncope.  Pt denies neurological change such as new headache, facial or extremity weakness.  Pt denies polydipsia, polyuria, or low sugar symptoms. Pt states overall good compliance with treatment and medications, good tolerability, and has been trying to follow lower cholesterol diet.  Pt denies worsening depressive symptoms, suicidal ideation or panic. No fever, night sweats, wt loss, loss of appetite, or other constitutional symptoms.  Pt states good ability with ADL's, has low fall risk, home safety reviewed and adequate, no other significant changes in hearing or vision, and only occasionally active with exercise.  Has also right shoulder pain, achy, worse with active ROM with decreased ROM Past Medical History  Diagnosis Date  . Malignant neoplasm of breast (female), unspecified site 03/13/2011  . CEREBROVASCULAR ACCIDENT, HX OF 08/24/2006  . HYPERTENSION 08/24/2006  . HYPERLIPIDEMIA 08/24/2006  . GERD 08/24/2006  . Stroke   . Blind right eye 07/04/2011    Since stroke 2003  . Impaired glucose tolerance 07/04/2011   Past Surgical History  Procedure Laterality Date  . Abdominal hysterectomy  1995    reports that she has been smoking.  She does not have any smokeless tobacco history on file. She reports that  drinks alcohol. She reports that she does not use illicit drugs. family history includes Cancer in her others; Diabetes in her other; Hypertension in her other; Stroke in her others; and Sudden death in her other. No Known Allergies Current Outpatient Prescriptions on File Prior to Visit  Medication Sig Dispense Refill  . amLODipine (NORVASC) 10 MG tablet Take 1 tablet (10 mg total) by mouth daily.  90 tablet  2  . aspirin 81 MG tablet  Take 81 mg by mouth daily.        . cholecalciferol (VITAMIN D) 400 UNITS TABS Take by mouth.        . clopidogrel (PLAVIX) 75 MG tablet Take 1 tablet (75 mg total) by mouth daily.  90 tablet  2  . cyclobenzaprine (FLEXERIL) 10 MG tablet Take 1 tablet (10 mg total) by mouth 3 (three) times daily as needed for muscle spasms.  30 tablet  0  . famotidine (PEPCID) 40 MG tablet Take 1 tablet (40 mg total) by mouth daily.  90 tablet  3  . lisinopril (PRINIVIL,ZESTRIL) 10 MG tablet Take 1 tablet (10 mg total) by mouth daily.  90 tablet  2  . metoprolol succinate (TOPROL-XL) 50 MG 24 hr tablet Take 1 tablet (50 mg total) by mouth daily.  90 tablet  3  . naproxen (NAPROSYN) 500 MG tablet Take 1 tablet (500 mg total) by mouth daily as needed.  30 tablet  5  . rosuvastatin (CRESTOR) 10 MG tablet Take 1 tablet (10 mg total) by mouth daily.  90 tablet  3   No current facility-administered medications on file prior to visit.   Review of Systems Constitutional: Negative for diaphoresis, activity change, appetite change or unexpected weight change.  HENT: Negative for hearing loss, ear pain, facial swelling, mouth sores and neck stiffness.   Eyes: Negative for pain, redness and visual disturbance.  Respiratory: Negative for shortness of breath and wheezing.   Cardiovascular: Negative for chest pain and palpitations.  Gastrointestinal: Negative for diarrhea,  blood in stool, abdominal distention or other pain Genitourinary: Negative for hematuria, flank pain or change in urine volume.  Musculoskeletal: Negative for myalgias and joint swelling.  Skin: Negative for color change and wound.  Neurological: Negative for syncope and numbness. other than noted Hematological: Negative for adenopathy.  Psychiatric/Behavioral: Negative for hallucinations, self-injury, decreased concentration and agitation.      Objective:   Physical Exam BP 110/60  Pulse 73  Temp(Src) 97.9 F (36.6 C) (Oral)  Ht 5\' 3"  (1.6 m)   Wt 213 lb 4 oz (96.73 kg)  BMI 37.79 kg/m2  SpO2 96% VS noted,  Constitutional: Pt is oriented to person, place, and time. Appears well-developed and well-nourished.  Head: Normocephalic and atraumatic.  Right Ear: External ear normal.  Left Ear: External ear normal.  Nose: Nose normal.  Mouth/Throat: Oropharynx is clear and moist.  Eyes: Conjunctivae and EOM are normal. Pupils are equal, round, and reactive to light.  Neck: Normal range of motion. Neck supple. No JVD present. No tracheal deviation present.  Cardiovascular: Normal rate, regular rhythm, normal heart sounds and intact distal pulses.   Pulmonary/Chest: Effort normal and breath sounds normal.  Abdominal: Soft. Bowel sounds are normal. There is no tenderness. No HSM  Musculoskeletal: Normal range of motion. Exhibits no edema.  Lymphadenopathy:  Has no cervical adenopathy.  Neurological: Pt is alert and oriented to person, place, and time. Pt has normal reflexes. No cranial nerve deficit.  Skin: Skin is warm and dry. No rash noted.  Psychiatric:  Has  normal mood and affect. Behavior is normal.  Right shoulder with pain and decreased ROM to internal rotation, also some tender to [facility] and bicipital tendon insertion site to anterior as well       Assessment & Plan:

## 2012-07-02 NOTE — Assessment & Plan Note (Signed)

## 2012-07-02 NOTE — Assessment & Plan Note (Signed)
stable overall by history and exam, recent data reviewed with pt, and pt to continue medical treatment as before,  to f/u any worsening symptoms or concerns Lab Results  Component Value Date   HGBA1C 6.5 07/02/2012

## 2012-07-02 NOTE — Patient Instructions (Signed)
You had the shingles shot today Please continue all other medications as before, and refills have been done if requested. Please have the pharmacy call with any other refills you may need. Please keep your appointments with your specialists as you have planned - oncology in November 2014 You will be contacted regarding the referral for: orthopedic for the right shoulder Please go to the LAB in the Basement (turn left off the elevator) for the tests to be done today You will be contacted by phone if any changes need to be made immediately.  Otherwise, you will receive a letter about your results with an explanation, but please check with MyChart first. Thank you for enrolling in MyChart. Please follow the instructions below to securely access your online medical record. MyChart allows you to send messages to your doctor, view your test results, renew your prescriptions, schedule appointments, and more. Please return in 6 months, or sooner if needed, with Lab testing done 3-5 days before

## 2012-07-12 ENCOUNTER — Encounter: Payer: Self-pay | Admitting: Oncology

## 2012-07-12 ENCOUNTER — Telehealth: Payer: Self-pay | Admitting: Oncology

## 2012-07-12 NOTE — Telephone Encounter (Signed)
s.w. pt and advised on new Dr....pt ok....mailed pt appt schedule and letter to pt....former pt for Dr. Donnie Coffin r/s to [facility]

## 2012-07-25 ENCOUNTER — Other Ambulatory Visit: Payer: Self-pay

## 2012-07-25 MED ORDER — AMLODIPINE BESYLATE 10 MG PO TABS: 10.0000 mg | ORAL_TABLET | Freq: Every day | ORAL | Status: DC

## 2012-07-25 MED ORDER — METOPROLOL SUCCINATE ER 50 MG PO TB24: 50.0000 mg | ORAL_TABLET | Freq: Every day | ORAL | Status: DC

## 2012-07-25 MED ORDER — ROSUVASTATIN CALCIUM 10 MG PO TABS: 10.0000 mg | ORAL_TABLET | Freq: Every day | ORAL | Status: DC

## 2012-07-25 MED ORDER — FAMOTIDINE 40 MG PO TABS: 40.0000 mg | ORAL_TABLET | Freq: Every day | ORAL | Status: DC

## 2012-07-25 MED ORDER — LISINOPRIL 10 MG PO TABS: 10.0000 mg | ORAL_TABLET | Freq: Every day | ORAL | Status: DC

## 2012-07-25 MED ORDER — CLOPIDOGREL BISULFATE 75 MG PO TABS: 75.0000 mg | ORAL_TABLET | Freq: Every day | ORAL | Status: DC

## 2012-09-05 ENCOUNTER — Ambulatory Visit
Admission: RE | Admit: 2012-09-05 | Discharge: 2012-09-05 | Disposition: A | Discharge status: Home / Self Care | Payer: Medicare Other | Source: Ambulatory Visit | Attending: Oncology | Admitting: Oncology

## 2012-09-05 DIAGNOSIS — Z9889 Other specified postprocedural states: Secondary | ICD-10-CM

## 2012-09-05 DIAGNOSIS — C50919 Malignant neoplasm of unspecified site of unspecified female breast: Secondary | ICD-10-CM

## 2012-11-03 ENCOUNTER — Ambulatory Visit (INDEPENDENT_AMBULATORY_CARE_PROVIDER_SITE_OTHER)
Admission: RE | Admit: 2012-11-03 | Discharge: 2012-11-03 | Disposition: A | Discharge status: Home / Self Care | Payer: Medicare Other | Source: Ambulatory Visit | Attending: Internal Medicine | Admitting: Internal Medicine

## 2012-11-03 ENCOUNTER — Encounter: Payer: Self-pay | Admitting: Internal Medicine

## 2012-11-03 ENCOUNTER — Ambulatory Visit (INDEPENDENT_AMBULATORY_CARE_PROVIDER_SITE_OTHER): Payer: Medicare Other | Admitting: Internal Medicine

## 2012-11-03 VITALS — BP 130/82 | HR 94 | Temp 98.0°F | Ht 63.0 in | Wt 211.4 lb

## 2012-11-03 DIAGNOSIS — M7710 Lateral epicondylitis, unspecified elbow: Secondary | ICD-10-CM

## 2012-11-03 DIAGNOSIS — R609 Edema, unspecified: Secondary | ICD-10-CM

## 2012-11-03 DIAGNOSIS — R7309 Other abnormal glucose: Secondary | ICD-10-CM

## 2012-11-03 DIAGNOSIS — M7712 Lateral epicondylitis, left elbow: Secondary | ICD-10-CM

## 2012-11-03 DIAGNOSIS — R7302 Impaired glucose tolerance (oral): Secondary | ICD-10-CM

## 2012-11-03 DIAGNOSIS — R600 Localized edema: Secondary | ICD-10-CM | POA: Insufficient documentation

## 2012-11-03 DIAGNOSIS — I10 Essential (primary) hypertension: Secondary | ICD-10-CM

## 2012-11-03 MED ORDER — AMLODIPINE BESYLATE 5 MG PO TABS: 5.0000 mg | ORAL_TABLET | Freq: Every day | ORAL | Status: DC

## 2012-11-03 MED ORDER — LISINOPRIL 20 MG PO TABS: 20.0000 mg | ORAL_TABLET | Freq: Every day | ORAL | Status: DC

## 2012-11-03 NOTE — Assessment & Plan Note (Signed)
With med changes as above, f/u in 3 wks

## 2012-11-03 NOTE — Assessment & Plan Note (Addendum)
Etiology unclear, but first in diff seems possible amlodipine vs other, will decrease amloidipine to 5 mg , incr ACE to 20 mg, also for ECG and CXR , hold further labs for now, f/u 3 wks.   Note:  Total time for pt hx, exam, review of record with pt in the room, determination of diagnoses and plan for further eval and tx is > 40 min, with over 50% spent in coordination and counseling of patient  ECG reviewed as per emr

## 2012-11-03 NOTE — Patient Instructions (Signed)
OK to decrease the amlodipine to 5 mg per day (you can take HALF of the 10 mg until they are used up), and also the lisinopril increased to 20 mg per day (you can take 2 of the 10 mg until they are used up) OK to take HALF of the flexeril (with the pill cutter) to avoid sleepiness as well) Please continue all other medications as before, and refills have been done if requested. Your EKG was OK today Please go to the XRAY Department in the Basement (go straight as you get off the elevator) for the x-ray testing You will be contacted by phone if any changes need to be made immediately.  Otherwise, you will receive a letter about your results with an explanation, but please check with MyChart first.  Please remember to sign up for My Chart if you have not done so, as this will be important to you in the future with finding out test results, communicating by private email, and scheduling acute appointments online when needed.  Please return in 3 weeks or sooner if needed

## 2012-11-03 NOTE — Assessment & Plan Note (Signed)
Mild, for forearm band, cont nsaid prn,  to f/u any worsening symptoms or concerns

## 2012-11-03 NOTE — Progress Notes (Signed)
Subjective:    Patient ID: Wendy Heath, female    DOB: 1950/05/20, 62 y.o.   MRN: [id number]  HPI  Here to f/u, with c/o 1 mo worsening graudaly left > right periph edeam to mid leg, no pain and Pt denies chest pain, increased sob or doe, wheezing, orthopnea, PND,  palpitations, dizziness or syncope.   Pt denies fever, wt loss, night sweats, loss of appetite, or other constitutional symptoms  Pt denies new neurological symptoms such as new headache, or facial or extremity weakness or numbness   Pt denies polydipsia, polyuria. Is on higher dose amllodipine and lower dose ACE.  Not drinking more fluids lately.  Leg swelling does not go way at night.  Last [id number] with LVH , normal EF.  Last cxr with small effusion 2008.  Last labs mar 2014 with GFR > 90.  No known pulm, hepatic, renal dx Also with c/o pain to left elbow area, worse to pick up heavy pan with cooking. ? Slight swelling, ibuprofen helps once daily.    Past Medical History  Diagnosis Date  . Malignant neoplasm of breast (female), unspecified site 03/13/2011  . CEREBROVASCULAR ACCIDENT, HX OF 08/24/2006  . HYPERTENSION 08/24/2006  . HYPERLIPIDEMIA 08/24/2006  . GERD 08/24/2006  . Stroke   . Blind right eye 07/04/2011    Since stroke 2003  . Impaired glucose tolerance 07/04/2011   Past Surgical History  Procedure Laterality Date  . Abdominal hysterectomy  1995    reports that she has been smoking.  She does not have any smokeless tobacco history on file. She reports that  drinks alcohol. She reports that she does not use illicit drugs. family history includes Cancer in her others; Diabetes in her other; Hypertension in her other; Stroke in her others; and Sudden death in her other. No Known Allergies Current Outpatient Prescriptions on File Prior to Visit  Medication Sig Dispense Refill  . amLODipine (NORVASC) 10 MG tablet Take 1 tablet (10 mg total) by mouth daily.  90 tablet  3  . aspirin 81 MG tablet Take 81 mg by mouth daily.         . cholecalciferol (VITAMIN D) 400 UNITS TABS Take by mouth.        . clopidogrel (PLAVIX) 75 MG tablet Take 1 tablet (75 mg total) by mouth daily.  90 tablet  3  . cyclobenzaprine (FLEXERIL) 10 MG tablet Take 1 tablet (10 mg total) by mouth 3 (three) times daily as needed for muscle spasms.  30 tablet  0  . famotidine (PEPCID) 40 MG tablet Take 1 tablet (40 mg total) by mouth daily.  90 tablet  3  . lisinopril (PRINIVIL,ZESTRIL) 10 MG tablet Take 1 tablet (10 mg total) by mouth daily.  90 tablet  3  . metoprolol succinate (TOPROL-XL) 50 MG 24 hr tablet Take 1 tablet (50 mg total) by mouth daily.  90 tablet  3  . rosuvastatin (CRESTOR) 10 MG tablet Take 1 tablet (10 mg total) by mouth daily.  90 tablet  3   No current facility-administered medications on file prior to visit.   Review of Systems  Constitutional: Negative for unexpected weight change, or unusual diaphoresis  HENT: Negative for tinnitus.   Eyes: Negative for photophobia and visual disturbance.  Respiratory: Negative for choking and stridor.   Gastrointestinal: Negative for vomiting and blood in stool.  Genitourinary: Negative for hematuria and decreased urine volume.  Musculoskeletal: Negative for acute joint swelling Skin: Negative for color change  and wound.  Neurological: Negative for tremors and numbness other than noted  Psychiatric/Behavioral: Negative for decreased concentration or  hyperactivity.       Objective:   Physical Exam BP 130/82  Pulse 94  Temp(Src) 98 F (36.7 C) (Oral)  Ht 5\' 3"  (1.6 m)  Wt 211 lb 6 oz (95.879 kg)  BMI 37.45 kg/m2  SpO2 95% VS noted,  Constitutional: Pt appears well-developed and well-nourished.  HENT: Head: NCAT.  Right Ear: External ear normal.  Left Ear: External ear normal.  Eyes: Conjunctivae and EOM are normal. Pupils are equal, round, and reactive to light.  Neck: Normal range of motion. Neck supple.  Cardiovascular: Normal rate and regular rhythm.    Pulmonary/Chest: Effort normal and breath sounds normal.  Abd:  Soft, NT, non-distended, + BS Neurological: Pt is alert. Not confused  Skin: Skin is warm. No erythema. has trace to 1+ edema bilat to mid leg left > right Tender to left lateral epicondylar, mild, ? Trace swelling Psychiatric: Pt behavior is normal. Thought content normal.      Assessment & Plan:

## 2012-11-03 NOTE — Assessment & Plan Note (Signed)
stable overall by history and exam, recent data reviewed with pt, and pt to continue medical treatment as before,  to f/u any worsening symptoms or concerns Lab Results  Component Value Date   HGBA1C 6.5 07/02/2012    

## 2012-11-24 ENCOUNTER — Ambulatory Visit (INDEPENDENT_AMBULATORY_CARE_PROVIDER_SITE_OTHER): Payer: Medicare Other | Admitting: Internal Medicine

## 2012-11-24 ENCOUNTER — Encounter: Payer: Self-pay | Admitting: Internal Medicine

## 2012-11-24 VITALS — BP 118/88 | HR 64 | Temp 98.3°F | Ht 63.0 in | Wt 209.4 lb

## 2012-11-24 DIAGNOSIS — M7712 Lateral epicondylitis, left elbow: Secondary | ICD-10-CM

## 2012-11-24 DIAGNOSIS — IMO0001 Reserved for inherently not codable concepts without codable children: Secondary | ICD-10-CM

## 2012-11-24 DIAGNOSIS — M7710 Lateral epicondylitis, unspecified elbow: Secondary | ICD-10-CM

## 2012-11-24 DIAGNOSIS — R7302 Impaired glucose tolerance (oral): Secondary | ICD-10-CM

## 2012-11-24 DIAGNOSIS — Z0000 Encounter for general adult medical examination without abnormal findings: Secondary | ICD-10-CM

## 2012-11-24 DIAGNOSIS — I10 Essential (primary) hypertension: Secondary | ICD-10-CM

## 2012-11-24 DIAGNOSIS — R7309 Other abnormal glucose: Secondary | ICD-10-CM

## 2012-11-24 DIAGNOSIS — R609 Edema, unspecified: Secondary | ICD-10-CM

## 2012-11-24 MED ORDER — HYDROCHLOROTHIAZIDE 12.5 MG PO TABS: 12.5000 mg | ORAL_TABLET | Freq: Every day | ORAL | Status: DC

## 2012-11-24 NOTE — Assessment & Plan Note (Signed)
Mild to mod persistnet despite ibuprofen use - for ortho referral

## 2012-11-24 NOTE — Patient Instructions (Addendum)
Please take all new medication as prescribed Please continue all other medications as before, and refills have been done if requested. Please have the pharmacy call with any other refills you may need. Please continue your efforts at being more active, low cholesterol diet, and weight control. You will be contacted regarding the referral for: orthopedic  Please remember to sign up for My Chart if you have not done so, as this will be important to you in the future with finding out test results, communicating by private email, and scheduling acute appointments online when needed.  OK to cancel the September appt with me  Please return about March 2015, or sooner if needed, with Lab testing done 3-5 days before

## 2012-11-24 NOTE — Assessment & Plan Note (Signed)
stable overall by history and exam, recent data reviewed with pt, and pt to continue medical treatment as before,  to f/u any worsening symptoms or concerns Lab Results  Component Value Date   HGBA1C 6.5 07/02/2012

## 2012-11-24 NOTE — Assessment & Plan Note (Signed)
Improved with recent med, change, ok for add hct 12.5 qd,  to f/u any worsening symptoms or concerns

## 2012-11-24 NOTE — Progress Notes (Signed)
Subjective:    Patient ID: Wendy Heath, female    DOB: 1950/06/19, 62 y.o.   MRN: [id number]  HPI  Here to f/u; overall doing ok, lost 2 lbs with med change last visit,  Pt denies chest pain, increased sob or doe, wheezing, orthopnea, PND, increased LE swelling, palpitations, dizziness or syncope, infact has only trace ankle edema now, get to mid leg by evening.  Pt denies polydipsia, polyuria, or low sugar symptoms such as weakness or confusion improved with po intake.  Pt denies new neurological symptoms such as new headache, or facial or extremity weakness or numbness.   Pt states overall good compliance with meds, has been trying to follow lower cholesterol, diabetic diet, with wt overall stable,  but little exercise however. Past Medical History  Diagnosis Date  . Malignant neoplasm of breast (female), unspecified site 03/13/2011  . CEREBROVASCULAR ACCIDENT, HX OF 08/24/2006  . HYPERTENSION 08/24/2006  . HYPERLIPIDEMIA 08/24/2006  . GERD 08/24/2006  . Stroke   . Blind right eye 07/04/2011    Since stroke 2003  . Impaired glucose tolerance 07/04/2011   Past Surgical History  Procedure Laterality Date  . Abdominal hysterectomy  1995    reports that she has been smoking.  She does not have any smokeless tobacco history on file. She reports that  drinks alcohol. She reports that she does not use illicit drugs. family history includes Cancer in her others; Diabetes in her other; Hypertension in her other; Stroke in her others; and Sudden death in her other. No Known Allergies Current Outpatient Prescriptions on File Prior to Visit  Medication Sig Dispense Refill  . amLODipine (NORVASC) 5 MG tablet Take 1 tablet (5 mg total) by mouth daily.  90 tablet  3  . aspirin 81 MG tablet Take 81 mg by mouth daily.        . cholecalciferol (VITAMIN D) 400 UNITS TABS Take by mouth.        . clopidogrel (PLAVIX) 75 MG tablet Take 1 tablet (75 mg total) by mouth daily.  90 tablet  3  . cyclobenzaprine  (FLEXERIL) 10 MG tablet Take 1 tablet (10 mg total) by mouth 3 (three) times daily as needed for muscle spasms.  30 tablet  0  . famotidine (PEPCID) 40 MG tablet Take 1 tablet (40 mg total) by mouth daily.  90 tablet  3  . lisinopril (PRINIVIL,ZESTRIL) 20 MG tablet Take 1 tablet (20 mg total) by mouth daily.  90 tablet  3  . metoprolol succinate (TOPROL-XL) 50 MG 24 hr tablet Take 1 tablet (50 mg total) by mouth daily.  90 tablet  3  . rosuvastatin (CRESTOR) 10 MG tablet Take 1 tablet (10 mg total) by mouth daily.  90 tablet  3   No current facility-administered medications on file prior to visit.   Review of Systems  Constitutional: Negative for unexpected weight change, or unusual diaphoresis  HENT: Negative for tinnitus.   Eyes: Negative for photophobia and visual disturbance.  Respiratory: Negative for choking and stridor.   Gastrointestinal: Negative for vomiting and blood in stool.  Genitourinary: Negative for hematuria and decreased urine volume.  Musculoskeletal: Negative for acute joint swelling Skin: Negative for color change and wound.  Neurological: Negative for tremors and numbness other than noted  Psychiatric/Behavioral: Negative for decreased concentration or  hyperactivity.       Objective:   Physical Exam BP 118/88  Pulse 64  Temp(Src) 98.3 F (36.8 C) (Oral)  Ht 5\' 3"  (  1.6 m)  Wt 209 lb 6 oz (94.972 kg)  BMI 37.1 kg/m2  SpO2 95% VS noted,  Constitutional: Pt appears well-developed and well-nourished.  HENT: Head: NCAT.  Right Ear: External ear normal.  Left Ear: External ear normal.  Eyes: Conjunctivae and EOM are normal. Pupils are equal, round, and reactive to light.  Neck: Normal range of motion. Neck supple.  Cardiovascular: Normal rate and regular rhythm.   Pulmonary/Chest: Effort normal and breath sounds normal.  Neurological: Pt is alert. Not confused  Skin: Skin is warm. No erythema.  Psychiatric: Pt behavior is normal. Thought content normal.   Left lateral epicondylar - mod tender, swelling persists       Assessment & Plan:

## 2012-11-24 NOTE — Assessment & Plan Note (Signed)
stable overall by history and exam, recent data reviewed with pt, and pt to continue medical treatment as before,  to f/u any worsening symptoms or concerns BP Readings from Last 3 Encounters:  11/24/12 118/88  11/03/12 130/82  07/02/12 110/60

## 2013-01-02 ENCOUNTER — Ambulatory Visit: Payer: Medicare Other | Admitting: Internal Medicine

## 2013-02-03 ENCOUNTER — Inpatient Hospital Stay (HOSPITAL_COMMUNITY)
Admission: EM | Admit: 2013-02-03 | Discharge: 2013-02-07 | DRG: 394 | Disposition: A | Discharge status: Home / Self Care | Payer: Medicare Other | Attending: Internal Medicine | Admitting: Internal Medicine

## 2013-02-03 ENCOUNTER — Encounter (HOSPITAL_COMMUNITY): Payer: Self-pay | Admitting: Emergency Medicine

## 2013-02-03 ENCOUNTER — Emergency Department (HOSPITAL_COMMUNITY): Payer: Medicare Other

## 2013-02-03 DIAGNOSIS — K5900 Constipation, unspecified: Secondary | ICD-10-CM

## 2013-02-03 DIAGNOSIS — M25511 Pain in right shoulder: Secondary | ICD-10-CM

## 2013-02-03 DIAGNOSIS — IMO0002 Reserved for concepts with insufficient information to code with codable children: Secondary | ICD-10-CM

## 2013-02-03 DIAGNOSIS — M545 Low back pain, unspecified: Secondary | ICD-10-CM

## 2013-02-03 DIAGNOSIS — Z823 Family history of stroke: Secondary | ICD-10-CM

## 2013-02-03 DIAGNOSIS — H1010 Acute atopic conjunctivitis, unspecified eye: Secondary | ICD-10-CM

## 2013-02-03 DIAGNOSIS — K529 Noninfective gastroenteritis and colitis, unspecified: Secondary | ICD-10-CM | POA: Diagnosis present

## 2013-02-03 DIAGNOSIS — M7710 Lateral epicondylitis, unspecified elbow: Secondary | ICD-10-CM | POA: Diagnosis present

## 2013-02-03 DIAGNOSIS — R7302 Impaired glucose tolerance (oral): Secondary | ICD-10-CM

## 2013-02-03 DIAGNOSIS — D126 Benign neoplasm of colon, unspecified: Secondary | ICD-10-CM | POA: Diagnosis present

## 2013-02-03 DIAGNOSIS — Z7902 Long term (current) use of antithrombotics/antiplatelets: Secondary | ICD-10-CM

## 2013-02-03 DIAGNOSIS — C50919 Malignant neoplasm of unspecified site of unspecified female breast: Secondary | ICD-10-CM

## 2013-02-03 DIAGNOSIS — R609 Edema, unspecified: Secondary | ICD-10-CM

## 2013-02-03 DIAGNOSIS — M7712 Lateral epicondylitis, left elbow: Secondary | ICD-10-CM

## 2013-02-03 DIAGNOSIS — K5289 Other specified noninfective gastroenteritis and colitis: Secondary | ICD-10-CM

## 2013-02-03 DIAGNOSIS — Z23 Encounter for immunization: Secondary | ICD-10-CM

## 2013-02-03 DIAGNOSIS — Z0000 Encounter for general adult medical examination without abnormal findings: Secondary | ICD-10-CM

## 2013-02-03 DIAGNOSIS — E1159 Type 2 diabetes mellitus with other circulatory complications: Secondary | ICD-10-CM | POA: Diagnosis present

## 2013-02-03 DIAGNOSIS — Z853 Personal history of malignant neoplasm of breast: Secondary | ICD-10-CM

## 2013-02-03 DIAGNOSIS — E1169 Type 2 diabetes mellitus with other specified complication: Secondary | ICD-10-CM | POA: Diagnosis present

## 2013-02-03 DIAGNOSIS — Z8673 Personal history of transient ischemic attack (TIA), and cerebral infarction without residual deficits: Secondary | ICD-10-CM

## 2013-02-03 DIAGNOSIS — F17200 Nicotine dependence, unspecified, uncomplicated: Secondary | ICD-10-CM | POA: Diagnosis present

## 2013-02-03 DIAGNOSIS — E876 Hypokalemia: Secondary | ICD-10-CM | POA: Diagnosis present

## 2013-02-03 DIAGNOSIS — K921 Melena: Secondary | ICD-10-CM | POA: Diagnosis present

## 2013-02-03 DIAGNOSIS — D179 Benign lipomatous neoplasm, unspecified: Secondary | ICD-10-CM

## 2013-02-03 DIAGNOSIS — K5730 Diverticulosis of large intestine without perforation or abscess without bleeding: Secondary | ICD-10-CM | POA: Diagnosis present

## 2013-02-03 DIAGNOSIS — K559 Vascular disorder of intestine, unspecified: Principal | ICD-10-CM | POA: Diagnosis present

## 2013-02-03 DIAGNOSIS — Z79899 Other long term (current) drug therapy: Secondary | ICD-10-CM

## 2013-02-03 DIAGNOSIS — H1045 Other chronic allergic conjunctivitis: Secondary | ICD-10-CM

## 2013-02-03 DIAGNOSIS — I152 Hypertension secondary to endocrine disorders: Secondary | ICD-10-CM | POA: Diagnosis present

## 2013-02-03 DIAGNOSIS — R3129 Other microscopic hematuria: Secondary | ICD-10-CM

## 2013-02-03 DIAGNOSIS — E669 Obesity, unspecified: Secondary | ICD-10-CM

## 2013-02-03 DIAGNOSIS — Z6836 Body mass index (BMI) 36.0-36.9, adult: Secondary | ICD-10-CM

## 2013-02-03 DIAGNOSIS — R232 Flushing: Secondary | ICD-10-CM

## 2013-02-03 DIAGNOSIS — G8929 Other chronic pain: Secondary | ICD-10-CM | POA: Diagnosis present

## 2013-02-03 DIAGNOSIS — Z803 Family history of malignant neoplasm of breast: Secondary | ICD-10-CM

## 2013-02-03 DIAGNOSIS — E785 Hyperlipidemia, unspecified: Secondary | ICD-10-CM | POA: Diagnosis present

## 2013-02-03 DIAGNOSIS — I10 Essential (primary) hypertension: Secondary | ICD-10-CM

## 2013-02-03 DIAGNOSIS — K047 Periapical abscess without sinus: Secondary | ICD-10-CM

## 2013-02-03 DIAGNOSIS — R109 Unspecified abdominal pain: Secondary | ICD-10-CM | POA: Diagnosis present

## 2013-02-03 DIAGNOSIS — K633 Ulcer of intestine: Secondary | ICD-10-CM | POA: Diagnosis present

## 2013-02-03 DIAGNOSIS — M25519 Pain in unspecified shoulder: Secondary | ICD-10-CM | POA: Diagnosis present

## 2013-02-03 DIAGNOSIS — K219 Gastro-esophageal reflux disease without esophagitis: Secondary | ICD-10-CM | POA: Diagnosis present

## 2013-02-03 DIAGNOSIS — Z8679 Personal history of other diseases of the circulatory system: Secondary | ICD-10-CM

## 2013-02-03 DIAGNOSIS — H5440 Blindness, one eye, unspecified eye: Secondary | ICD-10-CM

## 2013-02-03 LAB — URINALYSIS, ROUTINE W REFLEX MICROSCOPIC
Glucose, UA: NEGATIVE mg/dL
Ketones, ur: NEGATIVE mg/dL
Nitrite: NEGATIVE
Protein, ur: 100 mg/dL — AB
Specific Gravity, Urine: 1.03 (ref 1.005–1.030)
Urobilinogen, UA: 1 mg/dL (ref 0.0–1.0)
pH: 6 (ref 5.0–8.0)

## 2013-02-03 LAB — COMPREHENSIVE METABOLIC PANEL
ALT: 12 U/L (ref 0–35)
AST: 18 U/L (ref 0–37)
Albumin: 3.9 g/dL (ref 3.5–5.2)
Alkaline Phosphatase: 100 U/L (ref 39–117)
BUN: 7 mg/dL (ref 6–23)
CO2: 26 mEq/L (ref 19–32)
Calcium: 10.1 mg/dL (ref 8.4–10.5)
Chloride: 97 mEq/L (ref 96–112)
Creatinine, Ser: 0.78 mg/dL (ref 0.50–1.10)
GFR calc Af Amer: >90 mL/min (ref >90)
GFR calc non Af Amer: 87 mL/min — ABNORMAL LOW (ref >90)
Glucose, Bld: 115 mg/dL — ABNORMAL HIGH (ref 70–99)
Potassium: 3.2 mEq/L — ABNORMAL LOW (ref 3.5–5.1)
Sodium: 135 mEq/L (ref 135–145)
Total Bilirubin: 0.4 mg/dL (ref 0.3–1.2)
Total Protein: 7.7 g/dL (ref 6.0–8.3)

## 2013-02-03 LAB — CBC WITH DIFFERENTIAL/PLATELET
Basophils Absolute: 0 10*3/uL (ref 0.0–0.1)
Basophils Relative: 0 % (ref 0–1)
Eosinophils Absolute: 0.2 10*3/uL (ref 0.0–0.7)
Eosinophils Relative: 3 % (ref 0–5)
HCT: 41.8 % (ref 36.0–46.0)
Hemoglobin: 13.8 g/dL (ref 12.0–15.0)
Lymphocytes Relative: 22 % (ref 12–46)
Lymphs Abs: 2.1 10*3/uL (ref 0.7–4.0)
MCH: 29.2 pg (ref 26.0–34.0)
MCHC: 33 g/dL (ref 30.0–36.0)
MCV: 88.6 fL (ref 78.0–100.0)
Monocytes Absolute: 0.6 10*3/uL (ref 0.1–1.0)
Monocytes Relative: 7 % (ref 3–12)
Neutro Abs: 6.4 10*3/uL (ref 1.7–7.7)
Neutrophils Relative %: 68 % (ref 43–77)
Platelets: 256 10*3/uL (ref 150–400)
RBC: 4.72 MIL/uL (ref 3.87–5.11)
RDW: 16.3 % — ABNORMAL HIGH (ref 11.5–15.5)
WBC: 9.3 10*3/uL (ref 4.0–10.5)

## 2013-02-03 LAB — URINE MICROSCOPIC-ADD ON

## 2013-02-03 LAB — PHOSPHORUS: Phosphorus: 2.6 mg/dL (ref 2.3–4.6)

## 2013-02-03 LAB — CBC
HCT: 36.1 % (ref 36.0–46.0)
Hemoglobin: 11.7 g/dL — ABNORMAL LOW (ref 12.0–15.0)
MCH: 28.7 pg (ref 26.0–34.0)
MCHC: 32.4 g/dL (ref 30.0–36.0)
MCV: 88.7 fL (ref 78.0–100.0)
Platelets: 213 10*3/uL (ref 150–400)
RBC: 4.07 MIL/uL (ref 3.87–5.11)
RDW: 16.4 % — ABNORMAL HIGH (ref 11.5–15.5)
WBC: 7.6 10*3/uL (ref 4.0–10.5)

## 2013-02-03 LAB — MAGNESIUM: Magnesium: 1.6 mg/dL (ref 1.5–2.5)

## 2013-02-03 LAB — OCCULT BLOOD, POC DEVICE: Fecal Occult Bld: POSITIVE — AB

## 2013-02-03 LAB — LIPASE, BLOOD: Lipase: 13 U/L (ref 11–59)

## 2013-02-03 LAB — CG4 I-STAT (LACTIC ACID): Lactic Acid, Venous: 1.77 mmol/L (ref 0.5–2.2)

## 2013-02-03 MED ORDER — DEXTROSE 5 % IV SOLN
1.0000 g | Freq: Once | INTRAVENOUS | Status: AC
  Administered 2013-02-03: 1 g via INTRAVENOUS
  Filled 2013-02-03: qty 10

## 2013-02-03 MED ORDER — HYDROCHLOROTHIAZIDE 25 MG PO TABS
12.5000 mg | ORAL_TABLET | Freq: Every day | ORAL | Status: DC
  Filled 2013-02-03: qty 0.5

## 2013-02-03 MED ORDER — HYDROMORPHONE HCL PF 1 MG/ML IJ SOLN
1.0000 mg | INTRAMUSCULAR | Status: DC | PRN
  Administered 2013-02-04 – 2013-02-05 (×3): 1 mg via INTRAVENOUS
  Filled 2013-02-03 (×14): qty 1

## 2013-02-03 MED ORDER — AMLODIPINE BESYLATE 5 MG PO TABS
5.0000 mg | ORAL_TABLET | Freq: Every day | ORAL | Status: DC
  Administered 2013-02-04 – 2013-02-07 (×4): 5 mg via ORAL
  Filled 2013-02-03 (×4): qty 1

## 2013-02-03 MED ORDER — ONDANSETRON HCL 4 MG/2ML IJ SOLN
4.0000 mg | Freq: Once | INTRAMUSCULAR | Status: AC
  Administered 2013-02-03: 4 mg via INTRAVENOUS
  Filled 2013-02-03: qty 2

## 2013-02-03 MED ORDER — CIPROFLOXACIN IN D5W 400 MG/200ML IV SOLN
400.0000 mg | Freq: Once | INTRAVENOUS | Status: AC
  Administered 2013-02-03: 400 mg via INTRAVENOUS
  Filled 2013-02-03: qty 200

## 2013-02-03 MED ORDER — CIPROFLOXACIN IN D5W 400 MG/200ML IV SOLN
400.0000 mg | Freq: Two times a day (BID) | INTRAVENOUS | Status: DC
  Administered 2013-02-04 – 2013-02-07 (×7): 400 mg via INTRAVENOUS
  Filled 2013-02-03 (×8): qty 200

## 2013-02-03 MED ORDER — HYDROMORPHONE HCL PF 1 MG/ML IJ SOLN
1.0000 mg | INTRAMUSCULAR | Status: DC | PRN
  Administered 2013-02-03 – 2013-02-06 (×12): 1 mg via INTRAVENOUS
  Filled 2013-02-03: qty 1

## 2013-02-03 MED ORDER — FAMOTIDINE 40 MG PO TABS
40.0000 mg | ORAL_TABLET | Freq: Every day | ORAL | Status: DC
  Administered 2013-02-04 – 2013-02-07 (×4): 40 mg via ORAL
  Filled 2013-02-03 (×4): qty 1

## 2013-02-03 MED ORDER — LISINOPRIL 20 MG PO TABS
20.0000 mg | ORAL_TABLET | Freq: Every day | ORAL | Status: DC
  Administered 2013-02-04 – 2013-02-07 (×4): 20 mg via ORAL
  Filled 2013-02-03 (×4): qty 1

## 2013-02-03 MED ORDER — IOHEXOL 300 MG/ML  SOLN
100.0000 mL | Freq: Once | INTRAMUSCULAR | Status: AC | PRN
  Administered 2013-02-03: 100 mL via INTRAVENOUS

## 2013-02-03 MED ORDER — CYCLOBENZAPRINE HCL 10 MG PO TABS
10.0000 mg | ORAL_TABLET | Freq: Three times a day (TID) | ORAL | Status: DC | PRN
  Filled 2013-02-03: qty 1

## 2013-02-03 MED ORDER — METOPROLOL SUCCINATE ER 50 MG PO TB24
50.0000 mg | ORAL_TABLET | Freq: Every day | ORAL | Status: DC
  Administered 2013-02-04 – 2013-02-07 (×4): 50 mg via ORAL
  Filled 2013-02-03 (×4): qty 1

## 2013-02-03 MED ORDER — POTASSIUM CHLORIDE CRYS ER 20 MEQ PO TBCR
40.0000 meq | EXTENDED_RELEASE_TABLET | Freq: Once | ORAL | Status: AC
  Administered 2013-02-03: 40 meq via ORAL
  Filled 2013-02-03: qty 2

## 2013-02-03 MED ORDER — HYDROMORPHONE HCL PF 1 MG/ML IJ SOLN
1.0000 mg | Freq: Once | INTRAMUSCULAR | Status: AC
  Administered 2013-02-03: 1 mg via INTRAVENOUS
  Filled 2013-02-03: qty 1

## 2013-02-03 MED ORDER — SODIUM CHLORIDE 0.9 % IJ SOLN
3.0000 mL | Freq: Two times a day (BID) | INTRAMUSCULAR | Status: DC
  Administered 2013-02-05: 3 mL via INTRAVENOUS

## 2013-02-03 MED ORDER — METRONIDAZOLE IN NACL 5-0.79 MG/ML-% IV SOLN
500.0000 mg | Freq: Once | INTRAVENOUS | Status: AC
  Administered 2013-02-03: 500 mg via INTRAVENOUS
  Filled 2013-02-03: qty 100

## 2013-02-03 MED ORDER — ONDANSETRON HCL 4 MG PO TABS: 4.0000 mg | ORAL_TABLET | Freq: Four times a day (QID) | ORAL | Status: DC | PRN

## 2013-02-03 MED ORDER — ATORVASTATIN CALCIUM 20 MG PO TABS
20.0000 mg | ORAL_TABLET | Freq: Every day | ORAL | Status: DC
  Administered 2013-02-04 – 2013-02-06 (×3): 20 mg via ORAL
  Filled 2013-02-03 (×4): qty 1

## 2013-02-03 MED ORDER — METRONIDAZOLE IN NACL 5-0.79 MG/ML-% IV SOLN
500.0000 mg | Freq: Three times a day (TID) | INTRAVENOUS | Status: DC
  Administered 2013-02-04 – 2013-02-07 (×11): 500 mg via INTRAVENOUS
  Filled 2013-02-03 (×12): qty 100

## 2013-02-03 MED ORDER — ONDANSETRON HCL 4 MG/2ML IJ SOLN
4.0000 mg | Freq: Four times a day (QID) | INTRAMUSCULAR | Status: DC | PRN
  Administered 2013-02-04: 4 mg via INTRAVENOUS
  Filled 2013-02-03: qty 2

## 2013-02-03 MED ORDER — HYDROCODONE-ACETAMINOPHEN 5-325 MG PO TABS
1.0000 | ORAL_TABLET | ORAL | Status: DC | PRN
  Administered 2013-02-07 (×2): 2 via ORAL
  Filled 2013-02-03 (×2): qty 2

## 2013-02-03 MED ORDER — IOHEXOL 300 MG/ML  SOLN
50.0000 mL | Freq: Once | INTRAMUSCULAR | Status: AC | PRN
  Administered 2013-02-03: 50 mL via ORAL

## 2013-02-03 MED ORDER — SODIUM CHLORIDE 0.9 % IV SOLN
INTRAVENOUS | Status: DC
  Administered 2013-02-03 – 2013-02-07 (×4): via INTRAVENOUS

## 2013-02-03 NOTE — H&P (Signed)
[facility] History and Physical  Wendy Heath:[id number] DOB: November 03, 1950 DOA: 02/03/2013  Referring physician: ED physician PCP: [name], MD   Chief Complaint: abdominal pain, nausea and vomiting   HPI:  62 year old female with past medical history of breast cancer, CVA, hypertension, hyperlipidemia, blindness to the right eye who presents to [facility] emergency department with main concern for progressively worsening generalized abdominal pain associated with nausea and non bloody vomiting, also associated with blood in the stool. Patient explains the symptoms started several days prior to this admission but earlier this morning around 5 AM she noted diarrhea with dark red blood in the stool. Patient describes abdominal pain as intermittent and crampy, 5/10 in severity when present and mostly located in right lower and right upper quadrant with radiation to the back and the entire abdomen. Patient denies fevers and chills, no significant weight loss. Patient also denies chest pain or shortness of breath, no focal neurological symptoms, no specific urinary concerns except dark urine. Patient denies recent sicknesses or hospitalizations, no recent sick contacts or it exposures.   In emergency department, patient found to be nauseated with one episode of non-bloody vomiting. FOBT positive, Hg stable at 13.8. TR[facility] asked to admit for further evaluation and management. CT of the abdomen and pelvis significant for diverticulosis of the distal colon with questionable diverticulitis versus inflammatory bowel disease.  Assessment and Plan:  Principal Problem:   Abdominal  pain, other specified site with N/V, diarrhea - as noted per CT abd/pelvis ? inflammatory bowel disease vs colitis - will admit to telemetry bed - start with providing supportive care with IVF, analgesia and antiemetics as needed - will treat with empiric ABX Flagyl and Ciprofloxacin - consider GI consultation in  AM Active Problems:   Blood in stool - unclear etiology, possibly related to diverticulosis but certainly worrisome for colon cancer as noted below - will check CBC now and again in AM, please note FOBT is positive - if further drop noted over the next 24 hours, plan on GI consultation in AM   HYPERTENSION - stable, continue HCTZ, Amlodipine, Lisinopril, Metoprolol   Hypokalemia - secondary to vomiting, diarrhea - supplement and repeat BMP in AM - check Mg in AM   HYPERLIPIDEMIA - continue statin   GERD - continue Pepcid   CEREBROVASCULAR ACCIDENT, HX OF - hold aspirin and plavix due to high risk bleeding   Radiological Exams on Admission: Ct Abdomen Pelvis W Contrast   02/03/2013    1. Diverticulosis of the distal colon.  2. Moderate wall thickening involving the splenic flexure of the colon without evidence of diverticulosis in this area. There is also mild wall thickening involving the distal 3rd of the transverse colon. Given the rather diffuse nature of this process, it is suspected that the abnormality may be due to colitis or inflammatory bowel disease. Colon carcinoma is not excluded. Other possibilities would include bowel wall ischemic change.  3. Focal lesions involving inferior right lobe of the liver, upper pole right kidney, and lower pole right kidney, also too small to characterize but likely cysts.    Code Status: Full Family Communication: Pt at bedside Disposition Plan: Admit to telemetry bed   Review of Systems:  Constitutional: Negative for fever, chills. Negative for diaphoresis.  HENT: Negative for hearing loss, ear pain, nosebleeds, congestion, sore throat, neck pain, tinnitus and ear discharge.   Eyes: Negative for blurred vision, double vision, photophobia, pain, discharge and redness.  Respiratory: Negative for cough,  hemoptysis, sputum production, shortness of breath, wheezing and stridor.   Cardiovascular: Negative for chest pain, palpitations,  orthopnea, claudication and leg swelling.  Gastrointestinal: Negative for heartburn, constipation, blood in stool and melena.  Genitourinary: Negative for dysuria, urgency, frequency, hematuria and flank pain.  Musculoskeletal: Negative for myalgias, back pain, joint pain and falls.  Skin: Negative for itching and rash.  Neurological: Negative for tingling, tremors, sensory change, speech change, focal weakness, loss of consciousness and headaches.  Endo/Heme/Allergies: Negative for environmental allergies and polydipsia. Does not bruise/bleed easily.  Psychiatric/Behavioral: Negative for suicidal ideas. The patient is not nervous/anxious.      Past Medical History  Diagnosis Date  . Malignant neoplasm of breast (female), unspecified site 03/13/2011  . CEREBROVASCULAR ACCIDENT, HX OF 08/24/2006  . HYPERTENSION 08/24/2006  . HYPERLIPIDEMIA 08/24/2006  . GERD 08/24/2006  . Stroke   . Blind right eye 07/04/2011    Since stroke 2003  . Impaired glucose tolerance 07/04/2011    Past Surgical History  Procedure Laterality Date  . Abdominal hysterectomy  1995    Social History:  reports that she has been smoking.  She does not have any smokeless tobacco history on file. She reports that she drinks alcohol. She reports that she does not use illicit drugs.  No Known Allergies  Family History  Problem Relation Age of Onset  . Stroke Other   . Cancer Other     breast cancer  . Cancer Other     breast cancer  . Stroke Other   . Hypertension Other   . Diabetes Other   . Sudden death Other     Prior to Admission medications   Medication Sig Start Date End Date Taking? Authorizing Provider  amLODipine (NORVASC) 5 MG tablet Take 1 tablet (5 mg total) by mouth daily. 11/03/12 11/03/13 Yes Corwin Levins, MD  aspirin 81 MG tablet Take 81 mg by mouth daily.    Yes Historical Provider, MD  cholecalciferol (VITAMIN D) 400 UNITS TABS Take by mouth.     Yes Historical Provider, MD  clopidogrel (PLAVIX)  75 MG tablet Take 75 mg by mouth daily. 07/25/12  Yes Corwin Levins, MD  cyclobenzaprine (FLEXERIL) 10 MG tablet Take 1 tablet (10 mg total) by mouth 3 (three) times daily as needed for muscle spasms. 06/11/12  Yes Nicki Reaper, NP  famotidine (PEPCID) 40 MG tablet Take 1 tablet (40 mg total) by mouth daily. 07/25/12  Yes Corwin Levins, MD  hydrochlorothiazide (HYDRODIURIL) 12.5 MG tablet Take 1 tablet (12.5 mg total) by mouth daily. 11/24/12  Yes Corwin Levins, MD  lisinopril (PRINIVIL,ZESTRIL) 20 MG tablet Take 20 mg by mouth daily. 11/03/12  Yes Corwin Levins, MD  metoprolol succinate (TOPROL-XL) 50 MG 24 hr tablet Take 1 tablet (50 mg total) by mouth daily. 07/25/12  Yes Corwin Levins, MD  rosuvastatin (CRESTOR) 10 MG tablet Take 1 tablet (10 mg total) by mouth daily. 07/25/12  Yes Corwin Levins, MD    Physical Exam: Filed Vitals:   02/03/13 1404 02/03/13 2000  BP: 149/89 131/90  Pulse: 100 68  Temp: 98.2 F (36.8 C) 97.7 F (36.5 C)  TempSrc: Oral Oral  Resp: 20 16  SpO2: 100% 93%    Physical Exam  Constitutional: Appears well-developed and well-nourished. No distress.  HENT: Normocephalic. External right and left ear normal. Oropharynx is clear and moist.  Eyes: Conjunctivae and EOM are normal. PERRLA, no scleral icterus.  Neck: Normal ROM. Neck supple.  No JVD. No tracheal deviation. No thyromegaly.  CVS: RRR, S1/S2 +, no murmurs, no gallops, no carotid bruit.  Pulmonary: Effort and breath sounds normal, no stridor, rhonchi, wheezes, rales.  Abdominal: Soft. BS +,  no distension, tenderness, rebound or guarding.  Musculoskeletal: Normal range of motion. No edema and no tenderness.  Lymphadenopathy: No lymphadenopathy noted, cervical, inguinal. Neuro: Alert. Normal reflexes, muscle tone coordination. No cranial nerve deficit. Skin: Skin is warm and dry. No rash noted. Not diaphoretic. No erythema. No pallor.  Psychiatric: Normal mood and affect. Behavior, judgment, thought content normal.    Labs on Admission:  Basic Metabolic Panel:  Recent Labs Lab 02/03/13 1505  NA 135  K 3.2*  CL 97  CO2 26  GLUCOSE 115*  BUN 7  CREATININE 0.78  CALCIUM 10.1   Liver Function Tests:  Recent Labs Lab 02/03/13 1505  AST 18  ALT 12  ALKPHOS 100  BILITOT 0.4  PROT 7.7  ALBUMIN 3.9    Recent Labs Lab 02/03/13 1505  LIPASE 13   CBC:  Recent Labs Lab 02/03/13 1505  WBC 9.3  NEUTROABS 6.4  HGB 13.8  HCT 41.8  MCV 88.6  PLT 256   EKG: Normal sinus rhythm, no ST/T wave changes  Debbora Presto, MD  [facility] Pager [phone]  If 7PM-7AM, please contact night-coverage [email or web address] Password [facility] 02/03/2013, 8:26 PM

## 2013-02-03 NOTE — ED Notes (Signed)
Per pt, started have abdominal cramping yesterday-went to bathroom and had blood in stool-vomited last nitght

## 2013-02-03 NOTE — ED Provider Notes (Signed)
CSN: [id number]     Arrival date & time 02/03/13  1341 History   First MD Initiated Contact with Patient 02/03/13 1538     Chief Complaint  Patient presents with  . Abdominal Pain   (Consider location/radiation/quality/duration/timing/severity/associated sxs/prior Treatment) The history is provided by the patient. No language interpreter was used.  Wendy Heath is a 62 y/o F with PMHx of breast cancer, CVA, HTN, HLD, GERD, stroke, blindness to the right eye, glucose intolerance presenting to the ED with abdominal pain, nausea, emesis, and blood in her stools that started this morning. Patient reported that last night she was experiencing diarrhea. Stated that when she woke up this morning, at approximately 5:00AM, stated that when she went to the bathroom she noticed that in her BM was dark reed blood. Stated that when she wiped there was dark red blood on the toilet paper - denied straining, constipation, difficulty with bowel movement. Patient reported that shortly after the bowel movement she was experiencing abdominal pain localized to the right lower and upper quadrants with radiation towards the back that is described as cramping, described as if she is giving labor. Patient reported that she is unable to keep anything down, reported that she was able to keep fluids down. Stated that when she went to urinate earlier in the ED to give a sample she noticed that her urine was dark and that she had dark red blood in her urine. Denied fever, chills, neck pain, neck stiffness, chest pain, shortness of breath, difficulty breathing, tingling, numbness, weakness, dysuria, dizziness, traveling.  PCP Dr. Excell Seltzer  Past Medical History  Diagnosis Date  . Malignant neoplasm of breast (female), unspecified site 03/13/2011  . CEREBROVASCULAR ACCIDENT, HX OF 08/24/2006  . HYPERTENSION 08/24/2006  . HYPERLIPIDEMIA 08/24/2006  . GERD 08/24/2006  . Stroke   . Blind right eye 07/04/2011    Since stroke 2003  .  Impaired glucose tolerance 07/04/2011   Past Surgical History  Procedure Laterality Date  . Abdominal hysterectomy  1995   Family History  Problem Relation Age of Onset  . Stroke Other   . Cancer Other     breast cancer  . Cancer Other     breast cancer  . Stroke Other   . Hypertension Other   . Diabetes Other   . Sudden death Other    History  Substance Use Topics  . Smoking status: Current Every Day Smoker  . Smokeless tobacco: Not on file  . Alcohol Use: Yes   OB History   Grav Para Term Preterm Abortions TAB SAB Ect Mult Living                 Review of Systems  Constitutional: Negative for fever and chills.  Respiratory: Negative for chest tightness and shortness of breath.   Cardiovascular: Negative for chest pain.  Gastrointestinal: Positive for nausea, vomiting, abdominal pain, diarrhea and blood in stool.  Genitourinary: Positive for hematuria. Negative for dysuria, decreased urine volume, vaginal bleeding, vaginal discharge, vaginal pain and pelvic pain.  Musculoskeletal: Positive for back pain. Negative for neck pain and neck stiffness.  Neurological: Negative for dizziness, weakness and numbness.  All other systems reviewed and are negative.    Allergies  Review of patient's allergies indicates no known allergies.  Home Medications   Current Outpatient Rx  Name  Route  Sig  Dispense  Refill  . amLODipine (NORVASC) 5 MG tablet   Oral   Take 1 tablet (5 mg  total) by mouth daily.   90 tablet   3   . aspirin 81 MG tablet   Oral   Take 81 mg by mouth daily.          . cholecalciferol (VITAMIN D) 400 UNITS TABS   Oral   Take by mouth.           . clopidogrel (PLAVIX) 75 MG tablet   Oral   Take 75 mg by mouth daily.         . cyclobenzaprine (FLEXERIL) 10 MG tablet   Oral   Take 1 tablet (10 mg total) by mouth 3 (three) times daily as needed for muscle spasms.   30 tablet   0   . famotidine (PEPCID) 40 MG tablet   Oral   Take 1  tablet (40 mg total) by mouth daily.   90 tablet   3   . hydrochlorothiazide (HYDRODIURIL) 12.5 MG tablet   Oral   Take 1 tablet (12.5 mg total) by mouth daily.   90 tablet   3   . lisinopril (PRINIVIL,ZESTRIL) 20 MG tablet   Oral   Take 20 mg by mouth daily.         . metoprolol succinate (TOPROL-XL) 50 MG 24 hr tablet   Oral   Take 1 tablet (50 mg total) by mouth daily.   90 tablet   3   . rosuvastatin (CRESTOR) 10 MG tablet   Oral   Take 1 tablet (10 mg total) by mouth daily.   90 tablet   3    BP 149/89  Pulse 100  Temp(Src) 98.2 F (36.8 C) (Oral)  Resp 20  SpO2 100% Physical Exam  Nursing note and vitals reviewed. Constitutional: She is oriented to person, place, and time. She appears well-developed and well-nourished. No distress.  HENT:  Head: Normocephalic and atraumatic.  Mouth/Throat: Oropharynx is clear and moist. No oropharyngeal exudate.  Eyes: Conjunctivae and EOM are normal. Pupils are equal, round, and reactive to light. Right eye exhibits no discharge. Left eye exhibits no discharge.  Neck: Normal range of motion. Neck supple.  Cardiovascular: Normal rate, regular rhythm and normal heart sounds.  Exam reveals no friction rub.   No murmur heard. Pulses:      Radial pulses are 2+ on the right side, and 2+ on the left side.       Dorsalis pedis pulses are 2+ on the right side, and 2+ on the left side.  Pulmonary/Chest: Effort normal and breath sounds normal. No respiratory distress. She has no wheezes. She has no rales.  Abdominal: Soft. Bowel sounds are normal. She exhibits no distension. There is tenderness. There is no rebound and no guarding.  Pain upon palpation to the RUQ and RLQ - discomfort upon palpation to the right side of the abdomen and flank Positive Murphy's sign Negative McBurney's point Negative CVA tenderness bilaterally  Genitourinary:     Rectal exam: Small skin tag noted to the 9 o'clock region of the anus. Negative  hemorrhoids, lesions, sores, active bleeding, drainage, fissures noted. Negative masses palpated. Sphincter tone proper. Negative bright red blood on glove. Stool brown on glove.   Musculoskeletal: Normal range of motion.  Lymphadenopathy:    She has no cervical adenopathy.  Neurological: She is alert and oriented to person, place, and time. She exhibits normal muscle tone. Coordination normal.  Skin: Skin is warm and dry. No rash noted. She is not diaphoretic. No erythema.  Psychiatric: She has a  normal mood and affect. Her behavior is normal. Thought content normal.    ED Course  Procedures (including critical care time)  6:32 PM Spoke with family and patient - had a long discussion regarding lab findings and imaging results. Discussed with patient that she will need to be admitted to the hospital regarding her colitis and possible ischemic bowel change.   7:36 PM Spoke with dr. Verta Ellen from [facility], discussed case. Patient to be admitted to the hospital for colitis, still need to rule out possible ischemic bowel disease. Patient placed as inpatient telemetry.   Labs Review Labs Reviewed  CBC WITH DIFFERENTIAL - Abnormal; Notable for the following:    RDW 16.3 (*)    All other components within normal limits  COMPREHENSIVE METABOLIC PANEL - Abnormal; Notable for the following:    Potassium 3.2 (*)    Glucose, Bld 115 (*)    GFR calc non Af Amer 87 (*)    All other components within normal limits  URINALYSIS, ROUTINE W REFLEX MICROSCOPIC - Abnormal; Notable for the following:    Color, Urine AMBER (*)    APPearance CLOUDY (*)    Hgb urine dipstick SMALL (*)    Bilirubin Urine MODERATE (*)    Protein, ur 100 (*)    Leukocytes, UA SMALL (*)    All other components within normal limits  URINE MICROSCOPIC-ADD ON - Abnormal; Notable for the following:    Squamous Epithelial / LPF MANY (*)    Bacteria, UA MANY (*)    All other components within normal limits  OCCULT  BLOOD, POC DEVICE - Abnormal; Notable for the following:    Fecal Occult Bld POSITIVE (*)    All other components within normal limits  URINE CULTURE  LIPASE, BLOOD  CG4 I-STAT (LACTIC ACID)   Imaging Review Ct Abdomen Pelvis W Contrast  02/03/2013   CLINICAL DATA:  Pain right lower back area, right abdomen began last night, diarrhea and bloody stool  EXAM: CT ABDOMEN AND PELVIS WITH CONTRAST  TECHNIQUE: Multidetector CT imaging of the abdomen and pelvis was performed using the standard protocol following bolus administration of intravenous contrast.  CONTRAST:  OMNIPAQUE IOHEXOL 300 MG/ML  SOLN  COMPARISON:  07/18/2010  FINDINGS: Visualized portions of the lung bases clear.  Liver normal except for a 1 cm round low-attenuation lesion in the inferior tip of the right lobe. The gallbladder normal. Spleen and pancreas are normal. Adrenal glands are normal. There is a 6 mm low-attenuation lesion lower pole right kidney. There is a 6 mm low-attenuation lesion upper pole right kidney. Kidneys are otherwise normal. There is calcification of the aorta with no dilatation.  An appendix is not identified. There is moderate diverticulosis of the sigmoid colon and mild diverticulosis of the descending colon. There is an area of moderate colon wall thickening at the splenic flexure, measuring about 8 cm in length. There is mild wall thickening involving distal transverse colon. Small bowel is normal.  Bladder is normal. Reproductive organs appear to be absent. There is no free fluid. There is no acute musculoskeletal abnormality.  IMPRESSION: 1. Diverticulosis of the distal colon. 2. Moderate wall thickening involving the splenic flexure of the colon without evidence of diverticulosis in this area. There is also mild wall thickening involving the distal 3rd of the transverse colon. Given the rather diffuse nature of this process, it is suspected that the abnormality may be due to colitis or inflammatory bowel  disease. Colon carcinoma is not  excluded. Other possibilities would include bowel wall ischemic change. 3. Focal lesions involving inferior right lobe of the liver, upper pole right kidney, and lower pole right kidney, also too small to characterize but likely cysts.   Electronically Signed   By: Esperanza Heir M.D.   On: 02/03/2013 18:02    EKG Interpretation   None       MDM   1. Colitis   2. Blood in stool   3. Abdominal pain     Patient presenting to the ED with abdominal pain, nausea, vomiting, blood in stools that started this morning.  Alert and oriented. Lungs clear to auscultation bilaterally. Heart rate and rhythm normal. Pulses palpable and strong, distal and proximal. BS normoactive in all quadrants - discomfort upon palpation to the RUQ and RLQ, positive Murphy's sign,negative McBurney's point - patient reported she had her appendix removed. Rectal exam negative masses palpated, negative hemorrhoids, negative fissures. Negative bright red blood on glove, brown stool noted.  Fecal occult positive. Lactic acid negative elevation. Urine noted small Hgb with small amount of leukocytes with many bacteria noted, approximately 7-10 WBC in urine noted - possible pyelonephritis. CBC negative elevation in WBC, negative leukocytosis noted. Hgb and Hct proper - Hgb 13.8 and Hct 41.8. CMP mildly low potassium, 132. Lipase negative elevation. CT abdomen and pelvis with contrast noted diverticulosis of the distal colon. Moderate wall thickening of the colon without evidence of diverticulosis, mild wall thickening involving the distal third of the transverse colon-suspicion to be inflammatory process, colitis-colon carcinoma cannot be excluded, cannot exclude bowel wall ischemic changes. Patient placed on IV fluids, IV pain medications, IV Cipro and Flagyl for inflammatory bowel disease, IV ceftriaxone for pyuria. Discussed with patient in great length findings of labs and imaging. Patient to be  admitted due to lower GI bleed possibly due to colitis/inflammatory bowel disease episode - as per CT scan ischemic bowel versus carcinoma cannot be excluded. Patient hemodynamically stable, stable Hgb and Hct - doubt hemorrhagic shock. Discussed with patient plan for admission. Discussed case with Dr. Verta Ellen - patient admitted to the hospital for colitis, still need to rule out possible ischemic bowel disease and carcinoma - patient admitted to inpatient Telemetry.     Raymon Mutton, PA-C 02/05/13 1323

## 2013-02-03 NOTE — ED Notes (Signed)
Pt crying in room d/t pain in R lower back area, pt states started having abdominal/R sided back cramping last night, today started having diarrhea and bloody stool, pt states it's dark red blood.

## 2013-02-04 DIAGNOSIS — I10 Essential (primary) hypertension: Secondary | ICD-10-CM

## 2013-02-04 DIAGNOSIS — E876 Hypokalemia: Secondary | ICD-10-CM

## 2013-02-04 LAB — URINE CULTURE: Colony Count: 70000

## 2013-02-04 LAB — COMPREHENSIVE METABOLIC PANEL
ALT: 11 U/L (ref 0–35)
AST: 15 U/L (ref 0–37)
Albumin: 2.9 g/dL — ABNORMAL LOW (ref 3.5–5.2)
Alkaline Phosphatase: 74 U/L (ref 39–117)
BUN: 5 mg/dL — ABNORMAL LOW (ref 6–23)
CO2: 25 mEq/L (ref 19–32)
Calcium: 8.5 mg/dL (ref 8.4–10.5)
Chloride: 102 mEq/L (ref 96–112)
Creatinine, Ser: 0.75 mg/dL (ref 0.50–1.10)
GFR calc Af Amer: >90 mL/min (ref >90)
GFR calc non Af Amer: 88 mL/min — ABNORMAL LOW (ref >90)
Glucose, Bld: 106 mg/dL — ABNORMAL HIGH (ref 70–99)
Potassium: 3.4 mEq/L — ABNORMAL LOW (ref 3.5–5.1)
Sodium: 136 mEq/L (ref 135–145)
Total Bilirubin: 0.2 mg/dL — ABNORMAL LOW (ref 0.3–1.2)
Total Protein: 6 g/dL (ref 6.0–8.3)

## 2013-02-04 LAB — TSH: TSH: 1.612 u[IU]/mL (ref 0.350–4.500)

## 2013-02-04 MED ORDER — ZOLPIDEM TARTRATE 5 MG PO TABS
5.0000 mg | ORAL_TABLET | Freq: Every evening | ORAL | Status: DC | PRN
  Administered 2013-02-05 – 2013-02-06 (×2): 5 mg via ORAL
  Filled 2013-02-04 (×2): qty 1

## 2013-02-04 MED ORDER — HYDROCHLOROTHIAZIDE 12.5 MG PO CAPS
12.5000 mg | ORAL_CAPSULE | Freq: Every day | ORAL | Status: DC
  Administered 2013-02-04 – 2013-02-07 (×4): 12.5 mg via ORAL
  Filled 2013-02-04 (×4): qty 1

## 2013-02-04 MED ORDER — PEG 3350-KCL-NA BICARB-NACL 420 G PO SOLR
4000.0000 mL | Freq: Once | ORAL | Status: AC
  Administered 2013-02-04: 4000 mL via ORAL

## 2013-02-04 MED ORDER — INFLUENZA VAC SPLIT QUAD 0.5 ML IM SUSP
0.5000 mL | INTRAMUSCULAR | Status: AC
  Administered 2013-02-04: 0.5 mL via INTRAMUSCULAR
  Filled 2013-02-04 (×2): qty 0.5

## 2013-02-04 MED ORDER — POTASSIUM CHLORIDE CRYS ER 20 MEQ PO TBCR
40.0000 meq | EXTENDED_RELEASE_TABLET | Freq: Once | ORAL | Status: AC
  Administered 2013-02-04: 40 meq via ORAL
  Filled 2013-02-04: qty 2

## 2013-02-04 NOTE — Progress Notes (Signed)
Went to give patient her potassium tablets and patient began to choke on the first pill. She stood up and grabbed her throat.  I immediately did the Heimlich maneuver and the pill came up after two thrusts.  I asked the patient if she was having any discomfort from where I had to thrust on her upper abdomen.  Told patient to report if she had any discomfort. WIll continue to monitor patient.

## 2013-02-04 NOTE — Progress Notes (Signed)
PT Cancellation Note  Patient Details Name: Wendy Heath MRN: [id number] DOB: [birth year]   Cancelled Treatment:    Reason Eval/Treat Not Completed: Medical issues which prohibited therapy;Pain limiting ability to participate (c/o pain R back, awaiting medication)   Rada Hay 02/04/2013, 5:08 PM Blanchard Kelch PT [phone]

## 2013-02-04 NOTE — Progress Notes (Signed)
[facility] PROGRESS NOTE  Wendy Heath ZOX:[id number] DOB: November 29, 1950 DOA: 02/03/2013 PCP: [name], MD  Assessment/Plan: Abdominal pain, other specified site with N/V, diarrhea  - as noted per CT abd/pelvis ? inflammatory bowel disease vs colitis vs ischemic colitis -Continue antibiotics with Cipro and Flagyl as well as supportive care with IVF, analgesia and antiemetics as needed  -I have consulted-Dr. Loreta Ave to see for further recommendations Active Problems:  Blood in stool  - unclear etiology, possibly related to diverticulosis/#1 but certainly worrisome for colon cancer as noted below  -GI consulted as above patient states her last colonoscopy per Dr. Loreta Ave was about 2 years ago HYPERTENSION  - stable, continue HCTZ, Amlodipine, Lisinopril, Metoprolol  Hypokalemia  - secondary to vomiting, diarrhea  -Replace K., magnesium within normal limits. HYPERLIPIDEMIA  - continue statin  GERD  - continue Pepcid  CEREBROVASCULAR ACCIDENT, HX OF  - hold aspirin and plavix due to high risk bleeding    Code Status: Full Family Communication: None at the bedside  Disposition Plan: To home when medically stable   Consultants:  GI-Dr. Loreta Ave  Procedures:  None  Antibiotics:  Cipro and Flagyl started on 10/14  Patient also received a dose of Rocephin x1 in ED on 10/14  HPI/Subjective: Complaining of nausea but no further vomiting. Denies diarrhea today  Objective: Filed Vitals:   02/04/13 1434  BP: 103/69  Pulse: 100  Temp: 98.1 F (36.7 C)  Resp: 16    Intake/Output Summary (Last 24 hours) at 02/04/13 1814 Last data filed at 02/04/13 1500  Gross per 24 hour  Intake 1871.67 ml  Output   1450 ml  Net 421.67 ml   Filed Weights   02/03/13 2221  Weight: 95.1 kg (209 lb 10.5 oz)   Exam:  General: alert & oriented x 3 In NAD Cardiovascular: RRR, nl S1 s2 Respiratory: CTAB Abdomen: soft +BS lower abdominal tenderness, right greater than left, no rebound  tenderness/ND, no masses palpable Extremities: No cyanosis and no edema    Data Reviewed: Basic Metabolic Panel:  Recent Labs Lab 02/03/13 1505 02/03/13 2232 02/04/13 0529  NA 135  --  136  K 3.2*  --  3.4*  CL 97  --  102  CO2 26  --  25  GLUCOSE 115*  --  106*  BUN 7  --  5*  CREATININE 0.78  --  0.75  CALCIUM 10.1  --  8.5  MG  --  1.6  --   PHOS  --  2.6  --    Liver Function Tests:  Recent Labs Lab 02/03/13 1505 02/04/13 0529  AST 18 15  ALT 12 11  ALKPHOS 100 74  BILITOT 0.4 0.2*  PROT 7.7 6.0  ALBUMIN 3.9 2.9*    Recent Labs Lab 02/03/13 1505  LIPASE 13   No results found for this basename: AMMONIA,  in the last 168 hours CBC:  Recent Labs Lab 02/03/13 1505 02/03/13 2232  WBC 9.3 7.6  NEUTROABS 6.4  --   HGB 13.8 11.7*  HCT 41.8 36.1  MCV 88.6 88.7  PLT 256 213   Cardiac Enzymes: No results found for this basename: CKTOTAL, CKMB, CKMBINDEX, TROPONINI,  in the last 168 hours BNP (last 3 results) No results found for this basename: PROBNP,  in the last 8760 hours CBG: No results found for this basename: GLUCAP,  in the last 168 hours  Recent Results (from the past 240 hour(s))  URINE CULTURE  Status: None   Collection Time    02/03/13  3:28 PM      Result Value Range Status   Specimen Description URINE, CLEAN CATCH   Final   Special Requests NONE   Final   Culture  Setup Time     Final   Value: 02/03/2013 21:04     Performed at [facility] Count     Final   Value: 70,000 COLONIES/ML     Performed at [facility]   Culture     Final   Value: Multiple bacterial morphotypes present, none predominant. Suggest appropriate recollection if clinically indicated.     Performed at [facility]   Report Status 02/04/2013 FINAL   Final     Studies: Ct Abdomen Pelvis W Contrast  02/03/2013   CLINICAL DATA:  Pain right lower back area, right abdomen began last night, diarrhea and bloody stool  EXAM: CT  ABDOMEN AND PELVIS WITH CONTRAST  TECHNIQUE: Multidetector CT imaging of the abdomen and pelvis was performed using the standard protocol following bolus administration of intravenous contrast.  CONTRAST:  OMNIPAQUE IOHEXOL 300 MG/ML  SOLN  COMPARISON:  07/18/2010  FINDINGS: Visualized portions of the lung bases clear.  Liver normal except for a 1 cm round low-attenuation lesion in the inferior tip of the right lobe. The gallbladder normal. Spleen and pancreas are normal. Adrenal glands are normal. There is a 6 mm low-attenuation lesion lower pole right kidney. There is a 6 mm low-attenuation lesion upper pole right kidney. Kidneys are otherwise normal. There is calcification of the aorta with no dilatation.  An appendix is not identified. There is moderate diverticulosis of the sigmoid colon and mild diverticulosis of the descending colon. There is an area of moderate colon wall thickening at the splenic flexure, measuring about 8 cm in length. There is mild wall thickening involving distal transverse colon. Small bowel is normal.  Bladder is normal. Reproductive organs appear to be absent. There is no free fluid. There is no acute musculoskeletal abnormality.  IMPRESSION: 1. Diverticulosis of the distal colon. 2. Moderate wall thickening involving the splenic flexure of the colon without evidence of diverticulosis in this area. There is also mild wall thickening involving the distal 3rd of the transverse colon. Given the rather diffuse nature of this process, it is suspected that the abnormality may be due to colitis or inflammatory bowel disease. Colon carcinoma is not excluded. Other possibilities would include bowel wall ischemic change. 3. Focal lesions involving inferior right lobe of the liver, upper pole right kidney, and lower pole right kidney, also too small to characterize but likely cysts.   Electronically Signed   By: Esperanza Heir M.D.   On: 02/03/2013 18:02    Scheduled Meds: .  amLODipine  5 mg Oral Daily  . atorvastatin  20 mg Oral q1800  . ciprofloxacin  400 mg Intravenous Q12H  . famotidine  40 mg Oral Daily  . hydrochlorothiazide  12.5 mg Oral Daily  . lisinopril  20 mg Oral Daily  . metoprolol succinate  50 mg Oral Daily  . metronidazole  500 mg Intravenous Q8H  . polyethylene glycol-electrolytes  4,000 mL Oral Once  . sodium chloride  3 mL Intravenous Q12H   Continuous Infusions: . sodium chloride 50 mL/hr at 02/03/13 2358    Principal Problem:   Abdominal  pain, other specified site Active Problems:   HYPERLIPIDEMIA   HYPERTENSION   GERD   CEREBROVASCULAR  ACCIDENT, HX OF   Blood in stool   Hypokalemia    Time spent: 35    [facility]  [facility] Pager [phone]. If 7PM-7AM, please contact night-coverage at [email or web address], password [facility] 02/04/2013, 6:14 PM  LOS: 1 day

## 2013-02-04 NOTE — Consult Note (Signed)
Reason for Consult: Rectal bleeding/abdominal pain. Referring Physician: THP  RENIAH COTTINGHAM is an 62 y.o. female.  HPI: 63 year old balck female gives a 2 day history of RLQ pain with rectal bleeding. She has a history of chronic constipation and can go 2-3 days with a BM. She denies any associated fever, chills or rigors. She has a good appetite and her weight has been stable. There is no history of dysphagia or odynophagia, abnormal weight loss, ulcers, jaundice or colitis. She has acid reflux for which she takes OTC medications occasionally. She had a colonoscopy in 2012 when she was found to have sigmoid diverticulosis and internal hemorrhoids; 2 hyperplastic polyps were removed. The exam was otherwise normal.  Past Medical History  Diagnosis Date  . Malignant neoplasm of breast (female), unspecified site 03/13/2011  . CEREBROVASCULAR ACCIDENT, HX OF 08/24/2006  . HYPERTENSION 08/24/2006  . HYPERLIPIDEMIA 08/24/2006  . GERD 08/24/2006  . Stroke   . Blind right eye 07/04/2011    Since stroke 2003  . Impaired glucose tolerance 07/04/2011   Past Surgical History  Procedure Laterality Date  . Abdominal hysterectomy  1995   Family History  Problem Relation Age of Onset  . Stroke Other   . Cancer Other     breast cancer  . Cancer Other     breast cancer  . Stroke Other   . Hypertension Other   . Diabetes Other   . Sudden death Other    Social History:  reports that she has been smoking.  She does not have any smokeless tobacco history on file. She reports that she drinks alcohol. She reports that she does not use illicit drugs.  Allergies: No Known Allergies  Medications: I have reviewed the patient's current medications.  Results for orders placed during the hospital encounter of 02/03/13 (from the past 48 hour(s))  CBC WITH DIFFERENTIAL     Status: Abnormal   Collection Time    02/03/13  3:05 PM      Result Value Range   WBC 9.3  4.0 - 10.5 K/uL   RBC 4.72  3.87 - 5.11 MIL/uL    Hemoglobin 13.8  12.0 - 15.0 g/dL   HCT 14.7  82.9 - 56.2 %   MCV 88.6  78.0 - 100.0 fL   MCH 29.2  26.0 - 34.0 pg   MCHC 33.0  30.0 - 36.0 g/dL   RDW 13.0 (*) 86.5 - 78.4 %   Platelets 256  150 - 400 K/uL   Neutrophils Relative % 68  43 - 77 %   Neutro Abs 6.4  1.7 - 7.7 K/uL   Lymphocytes Relative 22  12 - 46 %   Lymphs Abs 2.1  0.7 - 4.0 K/uL   Monocytes Relative 7  3 - 12 %   Monocytes Absolute 0.6  0.1 - 1.0 K/uL   Eosinophils Relative 3  0 - 5 %   Eosinophils Absolute 0.2  0.0 - 0.7 K/uL   Basophils Relative 0  0 - 1 %   Basophils Absolute 0.0  0.0 - 0.1 K/uL  COMPREHENSIVE METABOLIC PANEL     Status: Abnormal   Collection Time    02/03/13  3:05 PM      Result Value Range   Sodium 135  135 - 145 mEq/L   Potassium 3.2 (*) 3.5 - 5.1 mEq/L   Chloride 97  96 - 112 mEq/L   CO2 26  19 - 32 mEq/L   Glucose, Bld  115 (*) 70 - 99 mg/dL   BUN 7  6 - 23 mg/dL   Creatinine, Ser 0.45  0.50 - 1.10 mg/dL   Calcium 40.9  8.4 - 81.1 mg/dL   Total Protein 7.7  6.0 - 8.3 g/dL   Albumin 3.9  3.5 - 5.2 g/dL   AST 18  0 - 37 U/L   ALT 12  0 - 35 U/L   Alkaline Phosphatase 100  39 - 117 U/L   Total Bilirubin 0.4  0.3 - 1.2 mg/dL   GFR calc non Af Amer 87 (*) >90 mL/min   GFR calc Af Amer >90  >90 mL/min   Comment: (NOTE)     The eGFR has been calculated using the CKD EPI equation.     This calculation has not been validated in all clinical situations.     eGFR's persistently <90 mL/min signify possible Chronic Kidney     Disease.  LIPASE, BLOOD     Status: None   Collection Time    02/03/13  3:05 PM      Result Value Range   Lipase 13  11 - 59 U/L  URINALYSIS, ROUTINE W REFLEX MICROSCOPIC     Status: Abnormal   Collection Time    02/03/13  3:28 PM      Result Value Range   Color, Urine AMBER (*) YELLOW   Comment: BIOCHEMICALS MAY BE AFFECTED BY COLOR   APPearance CLOUDY (*) CLEAR   Specific Gravity, Urine 1.030  1.005 - 1.030   pH 6.0  5.0 - 8.0   Glucose, UA NEGATIVE   NEGATIVE mg/dL   Hgb urine dipstick SMALL (*) NEGATIVE   Bilirubin Urine MODERATE (*) NEGATIVE   Ketones, ur NEGATIVE  NEGATIVE mg/dL   Protein, ur 914 (*) NEGATIVE mg/dL   Urobilinogen, UA 1.0  0.0 - 1.0 mg/dL   Nitrite NEGATIVE  NEGATIVE   Leukocytes, UA SMALL (*) NEGATIVE  URINE MICROSCOPIC-ADD ON     Status: Abnormal   Collection Time    02/03/13  3:28 PM      Result Value Range   Squamous Epithelial / LPF MANY (*) RARE   WBC, UA 7-10  <3 WBC/hpf   Bacteria, UA MANY (*) RARE   Urine-Other MUCOUS PRESENT    URINE CULTURE     Status: None   Collection Time    02/03/13  3:28 PM      Result Value Range   Specimen Description URINE, CLEAN CATCH     Special Requests NONE     Culture  Setup Time       Value: 02/03/2013 21:04     Performed at [facility] Count       Value: 70,000 COLONIES/ML     Performed at [facility]   Culture       Value: Multiple bacterial morphotypes present, none predominant. Suggest appropriate recollection if clinically indicated.     Performed at [facility]   Report Status 02/04/2013 FINAL    CG4 I-STAT (LACTIC ACID)     Status: None   Collection Time    02/03/13  4:22 PM      Result Value Range   Lactic Acid, Venous 1.77  0.5 - 2.2 mmol/L  OCCULT BLOOD, POC DEVICE     Status: Abnormal   Collection Time    02/03/13  4:26 PM      Result Value Range   Fecal Occult Bld POSITIVE (*) NEGATIVE  MAGNESIUM     Status: None   Collection Time    02/03/13 10:32 PM      Result Value Range   Magnesium 1.6  1.5 - 2.5 mg/dL  PHOSPHORUS     Status: None   Collection Time    02/03/13 10:32 PM      Result Value Range   Phosphorus 2.6  2.3 - 4.6 mg/dL  CBC     Status: Abnormal   Collection Time    02/03/13 10:32 PM      Result Value Range   WBC 7.6  4.0 - 10.5 K/uL   RBC 4.07  3.87 - 5.11 MIL/uL   Hemoglobin 11.7 (*) 12.0 - 15.0 g/dL   HCT 08.6  57.8 - 46.9 %   MCV 88.7  78.0 - 100.0 fL   MCH 28.7  26.0 - 34.0 pg    MCHC 32.4  30.0 - 36.0 g/dL   RDW 62.9 (*) 52.8 - 41.3 %   Platelets 213  150 - 400 K/uL  TSH     Status: None   Collection Time    02/03/13 10:32 PM      Result Value Range   TSH 1.612  0.350 - 4.500 uIU/mL   Comment: Performed at [facility]  COMPREHENSIVE METABOLIC PANEL     Status: Abnormal   Collection Time    02/04/13  5:29 AM      Result Value Range   Sodium 136  135 - 145 mEq/L   Potassium 3.4 (*) 3.5 - 5.1 mEq/L   Chloride 102  96 - 112 mEq/L   CO2 25  19 - 32 mEq/L   Glucose, Bld 106 (*) 70 - 99 mg/dL   BUN 5 (*) 6 - 23 mg/dL   Creatinine, Ser 2.44  0.50 - 1.10 mg/dL   Calcium 8.5  8.4 - 01.0 mg/dL   Total Protein 6.0  6.0 - 8.3 g/dL   Albumin 2.9 (*) 3.5 - 5.2 g/dL   AST 15  0 - 37 U/L   ALT 11  0 - 35 U/L   Alkaline Phosphatase 74  39 - 117 U/L   Total Bilirubin 0.2 (*) 0.3 - 1.2 mg/dL   GFR calc non Af Amer 88 (*) >90 mL/min   GFR calc Af Amer >90  >90 mL/min   Comment: (NOTE)     The eGFR has been calculated using the CKD EPI equation.     This calculation has not been validated in all clinical situations.     eGFR's persistently <90 mL/min signify possible Chronic Kidney     Disease.   Ct Abdomen Pelvis W Contrast  02/03/2013   CLINICAL DATA:  Pain right lower back area, right abdomen began last night, diarrhea and bloody stool  EXAM: CT ABDOMEN AND PELVIS WITH CONTRAST  TECHNIQUE: Multidetector CT imaging of the abdomen and pelvis was performed using the standard protocol following bolus administration of intravenous contrast.  CONTRAST:  OMNIPAQUE IOHEXOL 300 MG/ML  SOLN  COMPARISON:  07/18/2010  FINDINGS: Visualized portions of the lung bases clear.  Liver normal except for a 1 cm round low-attenuation lesion in the inferior tip of the right lobe. The gallbladder normal. Spleen and pancreas are normal. Adrenal glands are normal. There is a 6 mm low-attenuation lesion lower pole right kidney. There is a 6 mm low-attenuation lesion upper pole right  kidney. Kidneys are otherwise normal. There is calcification of the aorta with no dilatation.  An  appendix is not identified. There is moderate diverticulosis of the sigmoid colon and mild diverticulosis of the descending colon. There is an area of moderate colon wall thickening at the splenic flexure, measuring about 8 cm in length. There is mild wall thickening involving distal transverse colon. Small bowel is normal.  Bladder is normal. Reproductive organs appear to be absent. There is no free fluid. There is no acute musculoskeletal abnormality.  IMPRESSION: 1. Diverticulosis of the distal colon. 2. Moderate wall thickening involving the splenic flexure of the colon without evidence of diverticulosis in this area. There is also mild wall thickening involving the distal 3rd of the transverse colon. Given the rather diffuse nature of this process, it is suspected that the abnormality may be due to colitis or inflammatory bowel disease. Colon carcinoma is not excluded. Other possibilities would include bowel wall ischemic change. 3. Focal lesions involving inferior right lobe of the liver, upper pole right kidney, and lower pole right kidney, also too small to characterize but likely cysts.   Electronically Signed   By: Esperanza Heir M.D.   On: 02/03/2013 18:02   Review of Systems  Constitutional: Positive for malaise/fatigue and diaphoresis. Negative for fever, chills and weight loss.  HENT: Negative.   Respiratory: Negative.   Cardiovascular: Negative.   Gastrointestinal: Positive for heartburn, nausea, abdominal pain, constipation and blood in stool. Negative for melena.  Genitourinary: Positive for hematuria. Negative for dysuria, urgency and frequency.  Musculoskeletal: Positive for back pain, joint pain and myalgias.  Skin: Negative.   Neurological: Positive for weakness. Negative for sensory change, speech change, focal weakness and seizures.  Psychiatric/Behavioral: Negative for depression,  suicidal ideas, hallucinations, memory loss and substance abuse. The patient is nervous/anxious. The patient does not have insomnia.    Blood pressure 103/69, pulse 100, temperature 98.1 F (36.7 C), temperature source Oral, resp. rate 16, height 5' 3.5" (1.613 m), weight 95.1 kg (209 lb 10.5 oz), SpO2 97.00%. Physical Exam  Constitutional: She is oriented to person, place, and time. She appears well-developed and well-nourished.  HENT:  Head: Normocephalic and atraumatic.  Eyes: EOM are normal.  Neck: Normal range of motion. Neck supple.  Cardiovascular: Normal rate and regular rhythm.   Respiratory: Effort normal and breath sounds normal.  GI: Soft. Bowel sounds are normal. She exhibits no distension. There is tenderness. There is no rebound.  Midline scar below the umbilicus from a previous hysterectomy  Musculoskeletal: Normal range of motion.  Neurological: She is alert and oriented to person, place, and time.  Skin: Skin is warm and dry.  Psychiatric: She has a normal mood and affect. Her behavior is normal. Judgment and thought content normal.   Assessment/Plan: 1) Rectal bleeding with abdominal pain and an abnormal CT scan of the abdomen revealing segmental colitis; will prep patient for a colonoscopy tonight; she may have ischemic colitis as there is thickening of the colon in the "watershed area"-splenic flexure. On Ciprofloxacin and Flagyl for presumed infectious colitis. 2) Sigmoid diverticulosis without evidence of diverticulitis on CT.  3) Chronic constipation.  4) GERD currently on H2 blockers.  5) HTN/Hyperlipidemia.  6) Blindness in right eye s/p CVA.  7) Chronic low back pain/shoulder pain/lateral epicondylitis. 8) Morbid obesity. Keriana Sarsfield 02/04/2013, 5:22 PM

## 2013-02-05 ENCOUNTER — Encounter (HOSPITAL_COMMUNITY)
Admission: EM | Disposition: A | Discharge status: Home / Self Care | Payer: Self-pay | Source: Home / Self Care | Attending: Internal Medicine

## 2013-02-05 ENCOUNTER — Encounter (HOSPITAL_COMMUNITY): Payer: Self-pay | Admitting: *Deleted

## 2013-02-05 DIAGNOSIS — R109 Unspecified abdominal pain: Secondary | ICD-10-CM

## 2013-02-05 HISTORY — PX: COLONOSCOPY: SHX5424

## 2013-02-05 LAB — CBC
HCT: 33.9 % — ABNORMAL LOW (ref 36.0–46.0)
Hemoglobin: 10.7 g/dL — ABNORMAL LOW (ref 12.0–15.0)
MCH: 28.2 pg (ref 26.0–34.0)
MCHC: 31.6 g/dL (ref 30.0–36.0)
MCV: 89.4 fL (ref 78.0–100.0)
Platelets: 189 10*3/uL (ref 150–400)
RBC: 3.79 MIL/uL — ABNORMAL LOW (ref 3.87–5.11)
RDW: 16.4 % — ABNORMAL HIGH (ref 11.5–15.5)
WBC: 7.6 10*3/uL (ref 4.0–10.5)

## 2013-02-05 LAB — BASIC METABOLIC PANEL
BUN: 6 mg/dL (ref 6–23)
CO2: 27 mEq/L (ref 19–32)
Calcium: 8.8 mg/dL (ref 8.4–10.5)
Chloride: 100 mEq/L (ref 96–112)
Creatinine, Ser: 0.85 mg/dL (ref 0.50–1.10)
GFR calc Af Amer: 82 mL/min — ABNORMAL LOW (ref >90)
GFR calc non Af Amer: 71 mL/min — ABNORMAL LOW (ref >90)
Glucose, Bld: 89 mg/dL (ref 70–99)
Potassium: 3.4 mEq/L — ABNORMAL LOW (ref 3.5–5.1)
Sodium: 134 mEq/L — ABNORMAL LOW (ref 135–145)

## 2013-02-05 SURGERY — COLONOSCOPY
Anesthesia: Moderate Sedation

## 2013-02-05 MED ORDER — POTASSIUM CHLORIDE 10 MEQ/100ML IV SOLN
10.0000 meq | INTRAVENOUS | Status: AC
  Administered 2013-02-05 (×2): 10 meq via INTRAVENOUS
  Filled 2013-02-05 (×2): qty 100

## 2013-02-05 MED ORDER — FENTANYL CITRATE 0.05 MG/ML IJ SOLN
INTRAMUSCULAR | Status: DC | PRN
  Administered 2013-02-05 (×2): 25 ug via INTRAVENOUS

## 2013-02-05 MED ORDER — FENTANYL CITRATE 0.05 MG/ML IJ SOLN
INTRAMUSCULAR | Status: AC
  Filled 2013-02-05: qty 2

## 2013-02-05 MED ORDER — SODIUM CHLORIDE 0.9 % IV SOLN: INTRAVENOUS | Status: DC

## 2013-02-05 MED ORDER — MIDAZOLAM HCL 5 MG/5ML IJ SOLN
INTRAMUSCULAR | Status: DC | PRN
  Administered 2013-02-05 (×2): 2 mg via INTRAVENOUS
  Administered 2013-02-05: 1 mg via INTRAVENOUS

## 2013-02-05 MED ORDER — MIDAZOLAM HCL 10 MG/2ML IJ SOLN
INTRAMUSCULAR | Status: AC
  Filled 2013-02-05: qty 2

## 2013-02-05 NOTE — Op Note (Signed)
[facility] [address] [place], [address]   COLONOSCOPY PROCEDURE REPORT  PATIENT: Wendy Heath, Wendy Heath  MR#: [id number] BIRTHDATE: Dec 23, 1950 , 62  yrs. old GENDER: Female ENDOSCOPIST: Jeani Hawking, MD REFERRED BY: PROCEDURE DATE:  02/05/2013 PROCEDURE:   Colonoscopy with snare polypectomy ASA CLASS:   Class III INDICATIONS:Abnormal CT scan and hematochezia. MEDICATIONS: Versed 5 mg IV and Fentanyl 50 mcg IV  DESCRIPTION OF PROCEDURE:   After the risks benefits and alternatives of the procedure were thoroughly explained, informed consent was obtained.  A digital rectal exam revealed no abnormalities of the rectum.   The     endoscope was introduced through the anus and advanced to the cecum, which was identified by both the appendix and ileocecal valve. No adverse events experienced.   The quality of the prep was good.  The instrument was then slowly withdrawn as the colon was fully examined.     FINDINGS: In the distal transverse colon and proximal descending colon a segment of colitis was identified.  there was an ulceration in this area also.  The gross findings are highly suspicious for an ischemic colitis.  Cold biopsies were obtained.  Diverticulosis was noted in the left side of the colon.  A small 4 mm sessile rectosigmoid polyp was removed with a cold snare..     The time to cecum=  .  Withdrawal time=  .  The scope was withdrawn and the procedure completed. COMPLICATIONS: There were no complications.  ENDOSCOPIC IMPRESSION: 1) Probable ischemic colitis. 2) Rectosigmoid colon polyp. 3) Left-sided diverticulosis.  RECOMMENDATIONS: 1) Await biopsy results. 2) Repeat the colonoscopy in 5-10 years. 3) Advance to a regular diet.  eSigned:  Jeani Hawking, MD 02/05/2013 4:36 PM   cc:

## 2013-02-05 NOTE — H&P (View-Only) (Signed)
[facility] PROGRESS NOTE  Wendy Heath MRN:[id number] DOB: 03/01/1951 DOA: 02/03/2013 PCP: James John, MD  Assessment/Plan: Abdominal pain, other specified site with N/V, diarrhea  - as noted per CT abd/pelvis ? inflammatory bowel disease vs colitis vs ischemic colitis -Continue antibiotics with Cipro and Flagyl as well as supportive care with IVF, analgesia and antiemetics as needed  -Appreciate Dr. Mann's assistance -colonoscopy planned for today>> follow Active Problems:  Blood in stool  -possibly related to diverticulosis/#1 but certainly worrisome for colon cancer as noted below  -GI consulted as above patient states her last colonoscopy per Dr. Mann was about 2 years ago HYPERTENSION  - stable, continue HCTZ, Amlodipine, Lisinopril, Metoprolol  Hypokalemia  - secondary to vomiting, diarrhea  -again Replace K., magnesium within normal limits. HYPERLIPIDEMIA  - continue statin  GERD  - continue Pepcid  CEREBROVASCULAR ACCIDENT, HX OF  - hold aspirin and plavix due to high risk bleeding    Code Status: Full Family Communication: None at the bedside  Disposition Plan: To home when medically stable   Consultants:  GI-Dr. Mann  Procedures:  None  Antibiotics:  Cipro and Flagyl started on 10/14  Patient also received a dose of Rocephin x1 in ED on 10/14  HPI/Subjective: Awaiting colonoscopy today, reports some abd pain denies any further nausea vomiting  Objective: Filed Vitals:   02/05/13 0505  BP: 129/83  Pulse: 76  Temp: 98 F (36.7 C)  Resp: 20    Intake/Output Summary (Last 24 hours) at 02/05/13 1042 Last data filed at 02/05/13 0900  Gross per 24 hour  Intake   2210 ml  Output    300 ml  Net   1910 ml   Filed Weights   02/03/13 2221  Weight: 95.1 kg (209 lb 10.5 oz)   Exam:  General: alert & oriented x 3 In NAD Cardiovascular: RRR, nl S1 s2 Respiratory: CTAB Abdomen: soft +BS mild abdominal tenderness no rebound tenderness/ND,  no masses palpable Extremities: No cyanosis and no edema    Data Reviewed: Basic Metabolic Panel:  Recent Labs Lab 02/03/13 1505 02/03/13 2232 02/04/13 0529 02/05/13 0540  NA 135  --  136 134*  K 3.2*  --  3.4* 3.4*  CL 97  --  102 100  CO2 26  --  25 27  GLUCOSE 115*  --  106* 89  BUN 7  --  5* 6  CREATININE 0.78  --  0.75 0.85  CALCIUM 10.1  --  8.5 8.8  MG  --  1.6  --   --   PHOS  --  2.6  --   --    Liver Function Tests:  Recent Labs Lab 02/03/13 1505 02/04/13 0529  AST 18 15  ALT 12 11  ALKPHOS 100 74  BILITOT 0.4 0.2*  PROT 7.7 6.0  ALBUMIN 3.9 2.9*    Recent Labs Lab 02/03/13 1505  LIPASE 13   No results found for this basename: AMMONIA,  in the last 168 hours CBC:  Recent Labs Lab 02/03/13 1505 02/03/13 2232 02/05/13 0540  WBC 9.3 7.6 7.6  NEUTROABS 6.4  --   --   HGB 13.8 11.7* 10.7*  HCT 41.8 36.1 33.9*  MCV 88.6 88.7 89.4  PLT 256 213 189   Cardiac Enzymes: No results found for this basename: CKTOTAL, CKMB, CKMBINDEX, TROPONINI,  in the last 168 hours BNP (last 3 results) No results found for this basename: PROBNP,  in the last 8760 hours CBG: No   results found for this basename: GLUCAP,  in the last 168 hours  Recent Results (from the past 240 hour(s))  URINE CULTURE     Status: None   Collection Time    02/03/13  3:28 PM      Result Value Range Status   Specimen Description URINE, CLEAN CATCH   Final   Special Requests NONE   Final   Culture  Setup Time     Final   Value: 02/03/2013 21:04     Performed at [facility]   Colony Count     Final   Value: 70,000 COLONIES/ML     Performed at [facility]   Culture     Final   Value: Multiple bacterial morphotypes present, none predominant. Suggest appropriate recollection if clinically indicated.     Performed at [facility]   Report Status 02/04/2013 FINAL   Final     Studies: Ct Abdomen Pelvis W Contrast  02/03/2013   CLINICAL DATA:  Pain right  lower back area, right abdomen began last night, diarrhea and bloody stool  EXAM: CT ABDOMEN AND PELVIS WITH CONTRAST  TECHNIQUE: Multidetector CT imaging of the abdomen and pelvis was performed using the standard protocol following bolus administration of intravenous contrast.  CONTRAST:  100mL OMNIPAQUE IOHEXOL 300 MG/ML  SOLN  COMPARISON:  07/18/2010  FINDINGS: Visualized portions of the lung bases clear.  Liver normal except for a 1 cm round low-attenuation lesion in the inferior tip of the right lobe. The gallbladder normal. Spleen and pancreas are normal. Adrenal glands are normal. There is a 6 mm low-attenuation lesion lower pole right kidney. There is a 6 mm low-attenuation lesion upper pole right kidney. Kidneys are otherwise normal. There is calcification of the aorta with no dilatation.  An appendix is not identified. There is moderate diverticulosis of the sigmoid colon and mild diverticulosis of the descending colon. There is an area of moderate colon wall thickening at the splenic flexure, measuring about 8 cm in length. There is mild wall thickening involving distal transverse colon. Small bowel is normal.  Bladder is normal. Reproductive organs appear to be absent. There is no free fluid. There is no acute musculoskeletal abnormality.  IMPRESSION: 1. Diverticulosis of the distal colon. 2. Moderate wall thickening involving the splenic flexure of the colon without evidence of diverticulosis in this area. There is also mild wall thickening involving the distal 3rd of the transverse colon. Given the rather diffuse nature of this process, it is suspected that the abnormality may be due to colitis or inflammatory bowel disease. Colon carcinoma is not excluded. Other possibilities would include bowel wall ischemic change. 3. Focal lesions involving inferior right lobe of the liver, upper pole right kidney, and lower pole right kidney, also too small to characterize but likely cysts.   Electronically Signed    By: Raymond  Rubner M.D.   On: 02/03/2013 18:02    Scheduled Meds: . amLODipine  5 mg Oral Daily  . atorvastatin  20 mg Oral q1800  . ciprofloxacin  400 mg Intravenous Q12H  . famotidine  40 mg Oral Daily  . hydrochlorothiazide  12.5 mg Oral Daily  . lisinopril  20 mg Oral Daily  . metoprolol succinate  50 mg Oral Daily  . metronidazole  500 mg Intravenous Q8H  . sodium chloride  3 mL Intravenous Q12H   Continuous Infusions: . sodium chloride 50 mL/hr at 02/04/13 2326    Principal Problem:   Abdominal    pain, other specified site Active Problems:   HYPERLIPIDEMIA   HYPERTENSION   GERD   CEREBROVASCULAR ACCIDENT, HX OF   Blood in stool   Hypokalemia    Time spent: 25    Osmar Howton C  [facility] Pager [phone]. If 7PM-7AM, please contact night-coverage at [email or web address], password [email or web address] 02/05/2013, 10:42 AM  LOS: 2 days              

## 2013-02-05 NOTE — Progress Notes (Signed)
[facility] PROGRESS NOTE  HADYN AZER ZOX:[id number] DOB: 01-May-1950 DOA: 02/03/2013 PCP: [name], MD  Assessment/Plan: Abdominal pain, other specified site with N/V, diarrhea  - as noted per CT abd/pelvis ? inflammatory bowel disease vs colitis vs ischemic colitis -Continue antibiotics with Cipro and Flagyl as well as supportive care with IVF, analgesia and antiemetics as needed  -Appreciate Dr. Kenna Gilbert assistance -colonoscopy planned for today>> follow Active Problems:  Blood in stool  -possibly related to diverticulosis/#1 but certainly worrisome for colon cancer as noted below  -GI consulted as above patient states her last colonoscopy per Dr. Loreta Ave was about 2 years ago HYPERTENSION  - stable, continue HCTZ, Amlodipine, Lisinopril, Metoprolol  Hypokalemia  - secondary to vomiting, diarrhea  -again Replace K., magnesium within normal limits. HYPERLIPIDEMIA  - continue statin  GERD  - continue Pepcid  CEREBROVASCULAR ACCIDENT, HX OF  - hold aspirin and plavix due to high risk bleeding    Code Status: Full Family Communication: None at the bedside  Disposition Plan: To home when medically stable   Consultants:  GI-Dr. Loreta Ave  Procedures:  None  Antibiotics:  Cipro and Flagyl started on 10/14  Patient also received a dose of Rocephin x1 in ED on 10/14  HPI/Subjective: Awaiting colonoscopy today, reports some abd pain denies any further nausea vomiting  Objective: Filed Vitals:   02/05/13 0505  BP: 129/83  Pulse: 76  Temp: 98 F (36.7 C)  Resp: 20    Intake/Output Summary (Last 24 hours) at 02/05/13 1042 Last data filed at 02/05/13 0900  Gross per 24 hour  Intake   2210 ml  Output    300 ml  Net   1910 ml   Filed Weights   02/03/13 2221  Weight: 95.1 kg (209 lb 10.5 oz)   Exam:  General: alert & oriented x 3 In NAD Cardiovascular: RRR, nl S1 s2 Respiratory: CTAB Abdomen: soft +BS mild abdominal tenderness no rebound tenderness/ND,  no masses palpable Extremities: No cyanosis and no edema    Data Reviewed: Basic Metabolic Panel:  Recent Labs Lab 02/03/13 1505 02/03/13 2232 02/04/13 0529 02/05/13 0540  NA 135  --  136 134*  K 3.2*  --  3.4* 3.4*  CL 97  --  102 100  CO2 26  --  25 27  GLUCOSE 115*  --  106* 89  BUN 7  --  5* 6  CREATININE 0.78  --  0.75 0.85  CALCIUM 10.1  --  8.5 8.8  MG  --  1.6  --   --   PHOS  --  2.6  --   --    Liver Function Tests:  Recent Labs Lab 02/03/13 1505 02/04/13 0529  AST 18 15  ALT 12 11  ALKPHOS 100 74  BILITOT 0.4 0.2*  PROT 7.7 6.0  ALBUMIN 3.9 2.9*    Recent Labs Lab 02/03/13 1505  LIPASE 13   No results found for this basename: AMMONIA,  in the last 168 hours CBC:  Recent Labs Lab 02/03/13 1505 02/03/13 2232 02/05/13 0540  WBC 9.3 7.6 7.6  NEUTROABS 6.4  --   --   HGB 13.8 11.7* 10.7*  HCT 41.8 36.1 33.9*  MCV 88.6 88.7 89.4  PLT 256 213 189   Cardiac Enzymes: No results found for this basename: CKTOTAL, CKMB, CKMBINDEX, TROPONINI,  in the last 168 hours BNP (last 3 results) No results found for this basename: PROBNP,  in the last 8760 hours CBG: No  results found for this basename: GLUCAP,  in the last 168 hours  Recent Results (from the past 240 hour(s))  URINE CULTURE     Status: None   Collection Time    02/03/13  3:28 PM      Result Value Range Status   Specimen Description URINE, CLEAN CATCH   Final   Special Requests NONE   Final   Culture  Setup Time     Final   Value: 02/03/2013 21:04     Performed at [facility] Count     Final   Value: 70,000 COLONIES/ML     Performed at [facility]   Culture     Final   Value: Multiple bacterial morphotypes present, none predominant. Suggest appropriate recollection if clinically indicated.     Performed at [facility]   Report Status 02/04/2013 FINAL   Final     Studies: Ct Abdomen Pelvis W Contrast  02/03/2013   CLINICAL DATA:  Pain right  lower back area, right abdomen began last night, diarrhea and bloody stool  EXAM: CT ABDOMEN AND PELVIS WITH CONTRAST  TECHNIQUE: Multidetector CT imaging of the abdomen and pelvis was performed using the standard protocol following bolus administration of intravenous contrast.  CONTRAST:  OMNIPAQUE IOHEXOL 300 MG/ML  SOLN  COMPARISON:  07/18/2010  FINDINGS: Visualized portions of the lung bases clear.  Liver normal except for a 1 cm round low-attenuation lesion in the inferior tip of the right lobe. The gallbladder normal. Spleen and pancreas are normal. Adrenal glands are normal. There is a 6 mm low-attenuation lesion lower pole right kidney. There is a 6 mm low-attenuation lesion upper pole right kidney. Kidneys are otherwise normal. There is calcification of the aorta with no dilatation.  An appendix is not identified. There is moderate diverticulosis of the sigmoid colon and mild diverticulosis of the descending colon. There is an area of moderate colon wall thickening at the splenic flexure, measuring about 8 cm in length. There is mild wall thickening involving distal transverse colon. Small bowel is normal.  Bladder is normal. Reproductive organs appear to be absent. There is no free fluid. There is no acute musculoskeletal abnormality.  IMPRESSION: 1. Diverticulosis of the distal colon. 2. Moderate wall thickening involving the splenic flexure of the colon without evidence of diverticulosis in this area. There is also mild wall thickening involving the distal 3rd of the transverse colon. Given the rather diffuse nature of this process, it is suspected that the abnormality may be due to colitis or inflammatory bowel disease. Colon carcinoma is not excluded. Other possibilities would include bowel wall ischemic change. 3. Focal lesions involving inferior right lobe of the liver, upper pole right kidney, and lower pole right kidney, also too small to characterize but likely cysts.   Electronically Signed    By: Esperanza Heir M.D.   On: 02/03/2013 18:02    Scheduled Meds: . amLODipine  5 mg Oral Daily  . atorvastatin  20 mg Oral q1800  . ciprofloxacin  400 mg Intravenous Q12H  . famotidine  40 mg Oral Daily  . hydrochlorothiazide  12.5 mg Oral Daily  . lisinopril  20 mg Oral Daily  . metoprolol succinate  50 mg Oral Daily  . metronidazole  500 mg Intravenous Q8H  . sodium chloride  3 mL Intravenous Q12H   Continuous Infusions: . sodium chloride 50 mL/hr at 02/04/13 2326    Principal Problem:   Abdominal  pain, other specified site Active Problems:   HYPERLIPIDEMIA   HYPERTENSION   GERD   CEREBROVASCULAR ACCIDENT, HX OF   Blood in stool   Hypokalemia    Time spent: 25    [facility] C  [facility] Pager [phone]. If 7PM-7AM, please contact night-coverage at [email or web address], password [email or web address] 02/05/2013, 10:42 AM  LOS: 2 days

## 2013-02-05 NOTE — Progress Notes (Signed)
PT Cancellation Note  Patient Details Name: Wendy Heath MRN: [id number] DOB: 08-29-1950   Cancelled Treatment:    Reason Eval/Treat Not Completed: PT screened, no needs identified, will sign off (pt reports up ad lib and does not require PT. will sign off.)   Rada Hay 02/05/2013, 8:44 AM Blanchard Kelch PT [phone]

## 2013-02-05 NOTE — Interval H&P Note (Signed)
History and Physical Interval Note:  02/05/2013 4:02 PM  Wendy Heath  has presented today for surgery, with the diagnosis of Rectal bleeding, diverticulosis, abnormal CT scan, CRC screening  The various methods of treatment have been discussed with the patient and family. After consideration of risks, benefits and other options for treatment, the patient has consented to  Procedure(s): COLONOSCOPY (N/A) as a surgical intervention .  The patient's history has been reviewed, patient examined, no change in status, stable for surgery.  I have reviewed the patient's chart and labs.  Questions were answered to the patient's satisfaction.     Brennden Masten D

## 2013-02-06 ENCOUNTER — Encounter (HOSPITAL_COMMUNITY): Payer: Self-pay | Admitting: Gastroenterology

## 2013-02-06 DIAGNOSIS — K921 Melena: Secondary | ICD-10-CM

## 2013-02-06 DIAGNOSIS — K529 Noninfective gastroenteritis and colitis, unspecified: Secondary | ICD-10-CM | POA: Diagnosis present

## 2013-02-06 NOTE — Progress Notes (Signed)
Patient ID: Wendy Heath  female  UVO:[id number]    DOB: October 05, 1950    DOA: 02/03/2013  PCP: [name], MD  Assessment/Plan: Principal Problem: Colitis with Abdominal pain,  N/V, diarrhea  - CT abd/pelvis ? inflammatory bowel disease vs colitis vs ischemic colitis  -Continue antibiotics with Cipro and Flagyl, IVF, antiemetics - Coloscopy showed probable ischemic colitis, left-sided diverticulosis, rectosigmoid colon polyp  Active Problems:  Hematochezia - Likely due to ischemic colitis, H&H stable  HYPERTENSION  - stable, continue HCTZ, Amlodipine, Lisinopril, Metoprolol   HYPERLIPIDEMIA  - continue statin   GERD  - continue Pepcid   CEREBROVASCULAR ACCIDENT, HX OF  - hold aspirin and plavix due to high risk bleeding   DVT Prophylaxis:  Code Status:  Disposition: Discussed in detail with the patient and her family at the bedside, patient requested to be DC home in AM. She will follow up with Dr Haywood Pao for biopsy results   Subjective: Denies any nausea, vomiting, diarrhea states abdominal pain is improving still, mild lower abdominal pain.   Objective: Weight change:   Intake/Output Summary (Last 24 hours) at 02/06/13 1504 Last data filed at 02/06/13 1300  Gross per 24 hour  Intake 1669.17 ml  Output      0 ml  Net 1669.17 ml   Blood pressure 121/77, pulse 73, temperature 98.3 F (36.8 C), temperature source Oral, resp. rate 18, height 5' 3.5" (1.613 m), weight 95.1 kg (209 lb 10.5 oz), SpO2 100.00%.  Physical Exam: General: Alert and awake, oriented x3, not in any acute distress. CVS: S1-S2 clear, no murmur rubs or gallops Chest: clear to auscultation bilaterally, no wheezing, rales or rhonchi Abdomen: soft  mildly tender in the lower abdomen, >LLQ normal bowel sounds  Extremities: no c/c/e bilaterally   Lab Results: Basic Metabolic Panel:  Recent Labs Lab 02/03/13 2232 02/04/13 0529 02/05/13 0540  NA  --  136 134*  K  --  3.4* 3.4*  CL  --  102  100  CO2  --  25 27  GLUCOSE  --  106* 89  BUN  --  5* 6  CREATININE  --  0.75 0.85  CALCIUM  --  8.5 8.8  MG 1.6  --   --   PHOS 2.6  --   --    Liver Function Tests:  Recent Labs Lab 02/03/13 1505 02/04/13 0529  AST 18 15  ALT 12 11  ALKPHOS 100 74  BILITOT 0.4 0.2*  PROT 7.7 6.0  ALBUMIN 3.9 2.9*    Recent Labs Lab 02/03/13 1505  LIPASE 13   No results found for this basename: AMMONIA,  in the last 168 hours CBC:  Recent Labs Lab 02/03/13 1505 02/03/13 2232 02/05/13 0540  WBC 9.3 7.6 7.6  NEUTROABS 6.4  --   --   HGB 13.8 11.7* 10.7*  HCT 41.8 36.1 33.9*  MCV 88.6 88.7 89.4  PLT 256 213 189   Cardiac Enzymes: No results found for this basename: CKTOTAL, CKMB, CKMBINDEX, TROPONINI,  in the last 168 hours BNP: No components found with this basename: POCBNP,  CBG: No results found for this basename: GLUCAP,  in the last 168 hours   Micro Results: Recent Results (from the past 240 hour(s))  URINE CULTURE     Status: None   Collection Time    02/03/13  3:28 PM      Result Value Range Status   Specimen Description URINE, CLEAN CATCH   Final   Special  Requests NONE   Final   Culture  Setup Time     Final   Value: 02/03/2013 21:04     Performed at [facility] Count     Final   Value: 70,000 COLONIES/ML     Performed at [facility]   Culture     Final   Value: Multiple bacterial morphotypes present, none predominant. Suggest appropriate recollection if clinically indicated.     Performed at [facility]   Report Status 02/04/2013 FINAL   Final    Studies/Results: Ct Abdomen Pelvis W Contrast  02/03/2013   CLINICAL DATA:  Pain right lower back area, right abdomen began last night, diarrhea and bloody stool  EXAM: CT ABDOMEN AND PELVIS WITH CONTRAST  TECHNIQUE: Multidetector CT imaging of the abdomen and pelvis was performed using the standard protocol following bolus administration of intravenous contrast.   CONTRAST:  OMNIPAQUE IOHEXOL 300 MG/ML  SOLN  COMPARISON:  07/18/2010  FINDINGS: Visualized portions of the lung bases clear.  Liver normal except for a 1 cm round low-attenuation lesion in the inferior tip of the right lobe. The gallbladder normal. Spleen and pancreas are normal. Adrenal glands are normal. There is a 6 mm low-attenuation lesion lower pole right kidney. There is a 6 mm low-attenuation lesion upper pole right kidney. Kidneys are otherwise normal. There is calcification of the aorta with no dilatation.  An appendix is not identified. There is moderate diverticulosis of the sigmoid colon and mild diverticulosis of the descending colon. There is an area of moderate colon wall thickening at the splenic flexure, measuring about 8 cm in length. There is mild wall thickening involving distal transverse colon. Small bowel is normal.  Bladder is normal. Reproductive organs appear to be absent. There is no free fluid. There is no acute musculoskeletal abnormality.  IMPRESSION: 1. Diverticulosis of the distal colon. 2. Moderate wall thickening involving the splenic flexure of the colon without evidence of diverticulosis in this area. There is also mild wall thickening involving the distal 3rd of the transverse colon. Given the rather diffuse nature of this process, it is suspected that the abnormality may be due to colitis or inflammatory bowel disease. Colon carcinoma is not excluded. Other possibilities would include bowel wall ischemic change. 3. Focal lesions involving inferior right lobe of the liver, upper pole right kidney, and lower pole right kidney, also too small to characterize but likely cysts.   Electronically Signed   By: Esperanza Heir M.D.   On: 02/03/2013 18:02    Medications: Scheduled Meds: . amLODipine  5 mg Oral Daily  . atorvastatin  20 mg Oral q1800  . ciprofloxacin  400 mg Intravenous Q12H  . famotidine  40 mg Oral Daily  . hydrochlorothiazide  12.5 mg Oral Daily  .  lisinopril  20 mg Oral Daily  . metoprolol succinate  50 mg Oral Daily  . metronidazole  500 mg Intravenous Q8H  . sodium chloride  3 mL Intravenous Q12H      LOS: 3 days   Lashondra Vaquerano M.D. [facility] 02/06/2013, 3:04 PM Pager: [phone]  If 7PM-7AM, please contact night-coverage [email or web address] Password [email or web address]

## 2013-02-07 DIAGNOSIS — K219 Gastro-esophageal reflux disease without esophagitis: Secondary | ICD-10-CM

## 2013-02-07 MED ORDER — POTASSIUM CHLORIDE ER 10 MEQ PO TBCR: 10.0000 meq | EXTENDED_RELEASE_TABLET | Freq: Every day | ORAL | Status: DC

## 2013-02-07 MED ORDER — CLOPIDOGREL BISULFATE 75 MG PO TABS: 75.0000 mg | ORAL_TABLET | Freq: Every day | ORAL | Status: DC

## 2013-02-07 MED ORDER — HYDROCODONE-ACETAMINOPHEN 5-325 MG PO TABS: 1.0000 | ORAL_TABLET | ORAL | Status: DC | PRN

## 2013-02-07 NOTE — Discharge Summary (Signed)
Physician Discharge Summary  Wendy Heath EAV:[id number] DOB: September 27, 1950 DOA: 02/03/2013  PCP: [name], MD  Admit date: 02/03/2013 Discharge date: 02/07/2013  Time spent: >20 minutes  Recommendations for Outpatient Follow-up:  Follow-up Information   Follow up with [name], MD. (In 1-2weeks, call for appt upon discharge)    Specialties:  Internal Medicine, Radiology   Contact information:   [address] [place] [address] [phone]       Follow up with Wendy Elizabeth, MD. (As needed)    Specialty:  Gastroenterology   Contact information:   [address], [place] [address] [phone]       Discharge Diagnoses:  Principal Problem:   colitis Active Problems:   HYPERLIPIDEMIA   HYPERTENSION   GERD   CEREBROVASCULAR ACCIDENT, HX OF   Abdominal  pain, other specified site   Blood in stool   Hypokalemia   Discharge Condition: IMPROVED/STABLE  Diet recommendation: as directed, full liquid diet x24 more hours as directed then a low fiber diet for a week>>then heart healthy diet  Filed Weights   02/03/13 2221  Weight: 95.1 kg (209 lb 10.5 oz)    History of present illness:  62 year old female with past medical history of breast cancer, CVA, hypertension, hyperlipidemia, blindness to the right eye who presents to [facility] emergency department with main concern for progressively worsening generalized abdominal pain associated with nausea and non bloody vomiting, also associated with blood in the stool. Patient explains the symptoms started several days prior to this admission but earlier this morning around 5 AM she noted diarrhea with dark red blood in the stool. Patient describes abdominal pain as intermittent and crampy, 5/10 in severity when present and mostly located in right lower and right upper quadrant with radiation to the back and the entire abdomen. Patient denies fevers and chills, no significant weight loss. Patient  also denies chest pain or shortness of breath, no focal neurological symptoms, no specific urinary concerns except dark urine. Patient denies recent sicknesses or hospitalizations, no recent sick contacts or it exposures.  In emergency department, patient found to be nauseated with one episode of non-bloody vomiting. FOBT positive, Hg stable at 13.8. TRH asked to admit for further evaluation and management. CT of the abdomen and pelvis significant for diverticulosis of the distal colon with questionable diverticulitis versus inflammatory bowel disease.   Hospital Course:  Probable ischemic colitis - as discussed above upon admission patient had CT abd/pelvis which showed? inflammatory bowel disease vs colitis vs ischemic colitis  -she was started on empiric antibiotics with Cipro and Flagyl as well as supportive care with IVF, analgesia and antiemetics as needed  -GI was consulted Dr. Mann/Dr. Elnoria Howard saw patient colonoscopy was done and findings consistent with probable ischemic colitis. -patient was placed on by mouth is following colonoscopy, has not had any further nausea vomiting and diarrhea. -Discussed patient with Dr. Juanda Chance on call for Dr.mann/hung today and she recommends to DC antibiotics( no further antibiotics upon discharge) and for that patient stays on a full liquid diet for 24 more hours and then low fiber diet for a week and then may resume her previous heart healthy diet, and from GI standpoint okay to be discharged and to followup outpt with GI prn. -She is to followup with her PCP. Active Problems:  Blood in stool  -Secondary to#1  -patient had colonoscopy with as above with snare polypectomy -patient's aspirin and Plavix were on hold during this  hospital stay,discussed with Dr. Dickie La today and she recommends restarting aspirin, but patient is to hold off Plavix for one more week and then resume.  HYPERTENSION  - stable, continue HCTZ, Amlodipine, Lisinopril, Metoprolol   Hypokalemia  - secondary to vomiting, diarrhea  -again Replace K., magnesium within normal limits.  HYPERLIPIDEMIA  - continue statin  GERD  - continue Pepcid  CEREBROVASCULAR ACCIDENT, HX OF  - as above aspirin and plavix we held due to #1, and she is to resume aspirin upon discharge but further hold off Plavix for one more week as recommended per GI.     Procedures:  Status post colonoscopy with snare polypectomy on 10/16 per Dr. Elnoria Howard  Consultations:  GI  Discharge Exam: Filed Vitals:   02/07/13 0456  BP: 131/69  Pulse: 72  Temp: 99.1 F (37.3 C)  Resp: 16   Exam:  General: alert & oriented x 3 In NAD  Cardiovascular: RRR, nl S1 s2  Respiratory: CTAB  Abdomen: soft +BS mild lower abdominal tenderness no rebound tenderness/ND, no masses palpable  Extremities: No cyanosis and no edema   Discharge Instructions  Discharge Orders   Future Appointments Provider Department Dept Phone   02/27/2013 10:00 AM Victorino December, MD [facility] [phone]   Future Orders Complete By Expires   Diet low fiber  As directed    Increase activity slowly  As directed        Medication List         amLODipine 5 MG tablet  Commonly known as:  NORVASC  Take 1 tablet (5 mg total) by mouth daily.     aspirin 81 MG tablet  Take 81 mg by mouth daily.     cholecalciferol 400 UNITS Tabs tablet  Commonly known as:  VITAMIN D  Take by mouth.     clopidogrel 75 MG tablet  Commonly known as:  PLAVIX  Take 1 tablet (75 mg total) by mouth daily.     cyclobenzaprine 10 MG tablet  Commonly known as:  FLEXERIL  Take 1 tablet (10 mg total) by mouth 3 (three) times daily as needed for muscle spasms.     famotidine 40 MG tablet  Commonly known as:  PEPCID  Take 1 tablet (40 mg total) by mouth daily.     hydrochlorothiazide 12.5 MG tablet  Commonly known as:  HYDRODIURIL  Take 1 tablet (12.5 mg total) by mouth daily.     HYDROcodone-acetaminophen  5-325 MG per tablet  Commonly known as:  NORCO/VICODIN  Take 1-2 tablets by mouth every 4 (four) hours as needed.     lisinopril 20 MG tablet  Commonly known as:  PRINIVIL,ZESTRIL  Take 20 mg by mouth daily.     metoprolol succinate 50 MG 24 hr tablet  Commonly known as:  TOPROL-XL  Take 1 tablet (50 mg total) by mouth daily.     potassium chloride 10 MEQ tablet  Commonly known as:  K-DUR  Take 1 tablet (10 mEq total) by mouth daily.     rosuvastatin 10 MG tablet  Commonly known as:  CRESTOR  Take 1 tablet (10 mg total) by mouth daily.       No Known Allergies     Follow-up Information   Follow up with [name], MD. (In 1-2weeks, call for appt upon discharge)    Specialties:  Internal Medicine, Radiology   Contact information:   [address] [place] [address] [phone]  Follow up with Wendy Elizabeth, MD. (As needed)    Specialty:  Gastroenterology   Contact information:   [address] [place] [address] [phone]        The results of significant diagnostics from this hospitalization (including imaging, microbiology, ancillary and laboratory) are listed below for reference.    Significant Diagnostic Studies: Ct Abdomen Pelvis W Contrast  02/03/2013   CLINICAL DATA:  Pain right lower back area, right abdomen began last night, diarrhea and bloody stool  EXAM: CT ABDOMEN AND PELVIS WITH CONTRAST  TECHNIQUE: Multidetector CT imaging of the abdomen and pelvis was performed using the standard protocol following bolus administration of intravenous contrast.  CONTRAST:  OMNIPAQUE IOHEXOL 300 MG/ML  SOLN  COMPARISON:  07/18/2010  FINDINGS: Visualized portions of the lung bases clear.  Liver normal except for a 1 cm round low-attenuation lesion in the inferior tip of the right lobe. The gallbladder normal. Spleen and pancreas are normal. Adrenal glands are normal. There is a 6 mm low-attenuation lesion lower pole right kidney.  There is a 6 mm low-attenuation lesion upper pole right kidney. Kidneys are otherwise normal. There is calcification of the aorta with no dilatation.  An appendix is not identified. There is moderate diverticulosis of the sigmoid colon and mild diverticulosis of the descending colon. There is an area of moderate colon wall thickening at the splenic flexure, measuring about 8 cm in length. There is mild wall thickening involving distal transverse colon. Small bowel is normal.  Bladder is normal. Reproductive organs appear to be absent. There is no free fluid. There is no acute musculoskeletal abnormality.  IMPRESSION: 1. Diverticulosis of the distal colon. 2. Moderate wall thickening involving the splenic flexure of the colon without evidence of diverticulosis in this area. There is also mild wall thickening involving the distal 3rd of the transverse colon. Given the rather diffuse nature of this process, it is suspected that the abnormality may be due to colitis or inflammatory bowel disease. Colon carcinoma is not excluded. Other possibilities would include bowel wall ischemic change. 3. Focal lesions involving inferior right lobe of the liver, upper pole right kidney, and lower pole right kidney, also too small to characterize but likely cysts.   Electronically Signed   By: Esperanza Heir M.D.   On: 02/03/2013 18:02    Microbiology: Recent Results (from the past 240 hour(s))  URINE CULTURE     Status: None   Collection Time    02/03/13  3:28 PM      Result Value Range Status   Specimen Description URINE, CLEAN CATCH   Final   Special Requests NONE   Final   Culture  Setup Time     Final   Value: 02/03/2013 21:04     Performed at [facility] Count     Final   Value: 70,000 COLONIES/ML     Performed at [facility]   Culture     Final   Value: Multiple bacterial morphotypes present, none predominant. Suggest appropriate recollection if clinically indicated.     Performed  at [facility]   Report Status 02/04/2013 FINAL   Final     Labs: Basic Metabolic Panel:  Recent Labs Lab 02/03/13 1505 02/03/13 2232 02/04/13 0529 02/05/13 0540  NA 135  --  136 134*  K 3.2*  --  3.4* 3.4*  CL 97  --  102 100  CO2 26  --  25 27  GLUCOSE 115*  --  106* 89  BUN 7  --  5* 6  CREATININE 0.78  --  0.75 0.85  CALCIUM 10.1  --  8.5 8.8  MG  --  1.6  --   --   PHOS  --  2.6  --   --    Liver Function Tests:  Recent Labs Lab 02/03/13 1505 02/04/13 0529  AST 18 15  ALT 12 11  ALKPHOS 100 74  BILITOT 0.4 0.2*  PROT 7.7 6.0  ALBUMIN 3.9 2.9*    Recent Labs Lab 02/03/13 1505  LIPASE 13   No results found for this basename: AMMONIA,  in the last 168 hours CBC:  Recent Labs Lab 02/03/13 1505 02/03/13 2232 02/05/13 0540  WBC 9.3 7.6 7.6  NEUTROABS 6.4  --   --   HGB 13.8 11.7* 10.7*  HCT 41.8 36.1 33.9*  MCV 88.6 88.7 89.4  PLT 256 213 189   Cardiac Enzymes: No results found for this basename: CKTOTAL, CKMB, CKMBINDEX, TROPONINI,  in the last 168 hours BNP: BNP (last 3 results) No results found for this basename: PROBNP,  in the last 8760 hours CBG: No results found for this basename: GLUCAP,  in the last 168 hours     Signed:  Kela Millin  [facility] 02/07/2013, 2:23 PM

## 2013-02-10 NOTE — ED Provider Notes (Signed)
Medical screening examination/treatment/procedure(s) were conducted as a shared visit with non-physician practitioner(s) and myself.  I personally evaluated the patient during the encounter.  62yF with abdominal pain and GI bleed. CT with colitis. Will tx for possible infectious etiology and amdit for further eval.   Raeford Razor, MD 02/10/13 [phone]

## 2013-02-25 ENCOUNTER — Inpatient Hospital Stay: Payer: Medicare Other | Admitting: Internal Medicine

## 2013-02-25 DIAGNOSIS — Z0289 Encounter for other administrative examinations: Secondary | ICD-10-CM

## 2013-02-27 ENCOUNTER — Encounter: Payer: Self-pay | Admitting: Oncology

## 2013-02-27 ENCOUNTER — Telehealth: Payer: Self-pay | Admitting: *Deleted

## 2013-02-27 ENCOUNTER — Ambulatory Visit (HOSPITAL_BASED_OUTPATIENT_CLINIC_OR_DEPARTMENT_OTHER): Payer: Medicare Other | Admitting: Oncology

## 2013-02-27 ENCOUNTER — Ambulatory Visit: Payer: Medicare Other | Admitting: Oncology

## 2013-02-27 VITALS — BP 167/89 | HR 84 | Temp 98.5°F | Resp 20 | Ht 63.5 in | Wt 205.1 lb

## 2013-02-27 DIAGNOSIS — C50919 Malignant neoplasm of unspecified site of unspecified female breast: Secondary | ICD-10-CM

## 2013-02-27 DIAGNOSIS — Z853 Personal history of malignant neoplasm of breast: Secondary | ICD-10-CM

## 2013-02-27 NOTE — Patient Instructions (Signed)
Breast Cancer Survivor Follow-Up Breast cancer begins when cells in the breast divide too rapidly. The extra cells form a lump (tumor). When the cancer is treated, the goal is to get rid of all cancer cells. However, sometimes a few cells survive. These cancer cells can then grow. They become recurrent cancer. This means the cancer comes back after treatment.  Most cases of recurrent breast cancer develop 3 to 5 years after treatment. However, sometimes it comes back just a few months after treatment. Other times, it does not come back until years later. If the cancer comes back in the same area as the first breast cancer, it is called a local recurrence. If the cancer comes back somewhere else in the body, it is called regional recurrence if the site is fairly near the breast or distant recurrence if it is far from the breast. Your caregiver may also use the term metastasize to indicate a cancer that has gone to another part of your body. Treatment is still possible after either kind of recurrence. The cancer can still be controlled.  CAUSES OF RECURRENT CANCER No one knows exactly why breast cancer starts in the first place. Why the cancer comes back after treatment is also not clear. It is known that certain conditions, called risk factors, can make this more likely. They include:  Developing breast cancer for the first time before age 60.  Having breast cancer that involves the lymph nodes. These are small, round pieces of tissue found all over the body. Their job is to help fight infections.  Having a large tumor. Cancer is more apt to come back if the first tumor was bigger than 2 inches (5 cm).  Having certain types of breast cancer, such as:  Inflammatory breast cancer. This rare type grows rapidly and causes the breast to become red and swollen.  A high-grade tumor. The grade of a tumor indicates how fast it will grow and spread. High-grade tumors grow more quickly than other types.  HER2  cancer. This refers to the tumor's genetic makeup. Tumors that have this type of gene are more likely to come back after treatment.  Having close tumor margins. This refers to the space between the tumor and normal, noncancerous cells. If the space is small, the tumor has a greater chance of coming back.  Having treatment involving a surgery to remove the tumor but not the entire breast (lumpectomy) and no radiation therapy. CARE AFTER BREAST CANCER Home Monitoring Women who have had breast cancer should continue to examine their breasts every month. The goal is to catch the cancer quickly if it comes back. Many women find it helpful to do so on the same day each month and to mark the calendar as a reminder. Let your caregiver know immediately if you have any signs of recurrent breast cancer. Symptoms will vary, depending on where the cancer recurs. The original type of treatment can also make a difference. Symptoms of local recurrence after a lumpectomy or a recurrence in the opposite breast may include:  A new lump or thickening in the breast.  A change in the way the skin looks on the breast (such as a rash, dimpling, or wrinkling).  Redness or swelling of the breast.  Changes in the nipple (such as being red, puckered, swollen, or leaking fluid). Symptoms of a recurrence after a breast removal surgery (mastectomy) may include:  A lump or thickening under the skin.  A thickening around the mastectomy scar. Symptoms   of regional recurrence in the lymph nodes near the breast may include:  A lump under the arm or above the collarbone.  Swelling of the arm.  Pain in the arm, shoulder, or chest.  Numbness in the hand or arm. Symptoms of distant recurrence may include:  A cough that does not go away.  Trouble breathing or shortness of breath.  Pain in the bones or the chest. This is pain that lasts or does not respond to rest and medicine.  Headaches.  Sudden vision  problems.  Dizziness.  Nausea or vomiting.  Losing weight without trying to.  Persistent abdominal pain.  Changes in bowel movements or blood in the stool.  Yellowing of the skin or eyes (jaundice).  Blood in the urine or bloody vaginal discharge. Clinical Monitoring  It is helpful to keep a schedule of appointments for needed tests and exams. This includes physical exams, breast exams, exams of the lymph nodes, and general exams.  For the first 3 years after being treated for breast cancer, see your caregiver every 3 to 6 months.  For years 4 and 5 after breast cancer, see your caregiver every 6 to 12 months.  After 5 years, see your caregiver at least once a year.  Regular breast X-rays (mammograms) should continue even if you had a mastectomy.  A mammogram should be done 1 year after the mammogram that first detected breast cancer.  A mammogram should be done every 6 to 12 months after that. Follow your caregiver's advice.  A pelvic exam done by your caregiver checks whether female organs are the normal size and shape. The exam is usually done every year. Ask your caregiver if that schedule is right for you.  Women taking tamoxifen should report any vaginal bleeding immediately to their caregiver. Tamoxifen is often given to women with a certain type of breast cancer. It has been shown to help prevent recurrence.  You will need to decide who your primary caregiver will be.  Most people continue to see their cancer specialist (oncologist) every 3 to 6 months for the first year after cancer treatment.  At some point, you may want to go back to seeing your family caregiver. You would no longer see your oncologist for regular checkups. Many women do this about 1 year after their first diagnosis of breast cancer.  You will still need to be seen every so often by your oncologist. Ask how often that should be. Coordinate this with your family or primary caregiver.  Think about  having genetic counseling. This would provide information on traits that can be passed or inherited from one generation to the next. In some cases, breast cancer runs in families. Tell your caregiver if you:  Are of Ashkenazi Jewish heritage.  Have any family member who has had ovarian cancer.  Have a mother, sister, or daughter who had breast cancer before age 50.  Have 2 or more close relatives who have had breast cancer. This means a mother, sister, daughter, aunt, or grandmother.  Had breast cancer in both breasts.  Have a female relative who has had breast cancer.  Some tests are not recommended for routine screening. Someone recovering from breast cancer does not need to have these tests if there are no problems. The tests have risks, such as radiation exposure, and can be costly. The risks of these tests are thought to be greater than the benefits:  Blood tests.  Chest X-rays.  Bone scans.  Liver ultrasound.    Computed tomography (CT scan).  Positron emission tomography (PET scan).  Magnetic resonance imaging (MRI scan). DIAGNOSIS OF RECURRENT CANCER Recurrent breast cancer may be suspected for various reasons. A mammogram may not look normal. You might feel a lump or have other symptoms. Your caregiver may find something unusual during an exam. To be sure, your caregiver will probably order some tests. The tests are needed because there are symptoms or hints of a problem. They could include:  Blood tests, including a test to check how well the liver is working. The liver is a common site for a distant cancer recurrence.  Imaging tests that create pictures of the inside of the body. These tests include:  Chest X-rays to show if the cancer has come back in the lungs.  CT scans to create detailed pictures of various areas of the body and help find a distant recurrence.  MRI scans to find anything unusual in the breast, chest, or lymph nodes.  Breast ultrasound tests to  examine the breasts.  Bone scans to create a picture of your whole skeleton and find cancer in bony areas.  PET scans to create an image of the whole body. PET scans can be used together with CT scans to show more detail.  Biopsy. A small sample of tissue is taken and checked under a microscope. If cancer cells are found, they may be tested to see if they contain the HER2 gene or the hormones estrogen and progesterone. This will help your caregiver decide how to treat the recurrent cancer. TREATMENT  How recurrent breast cancer is treated depends on where the new cancer is found. The type of treatment that was used for the first breast cancer makes a difference, too. A combination of treatments may be used. Options include:  Surgery.  If the cancer comes back in the breast that was not treated before, you may need a lumpectomy or mastectomy.  If the cancer comes back in the breast that was treated before, you may need a mastectomy.  The lymph nodes under the arm may need to be removed.  Radiation therapy.  For a local recurrence, radiation may be used if it was not used during the first treatment.  For a distance recurrence, radiation is sometimes used.  Chemotherapy.  This may be used before surgery to treat recurrent breast cancer.  This may be used to treat recurrent cancer that cannot be treated with surgery.  This may be used to treat a distant recurrence.  Hormone therapy.  Women with the HER2 gene may be given hormone therapy to attack this gene. Document Released: 12/06/2010 Document Revised: 07/02/2011 Document Reviewed: 12/06/2010 [employer] Patient Information 2014 [employer].  

## 2013-02-27 NOTE — Progress Notes (Signed)
OFFICE PROGRESS NOTE  CC  [name], MD [address] [place] [address]  DIAGNOSIS: 60 female with history of left triple negative breast cancer diagnosed 2008  PRIOR THERAPY:  #1 patient originally presented with locally advanced triple-negative left breast cancer. She went on to receive neoadjuvant chemotherapy followed by lumpectomy and sentinel lymph node biopsy performed on 11/25/2006. She then received radiation therapy and has been on followup.  CURRENT THERAPY: followup  INTERVAL HISTORY: Wendy Heath 62 y.o. female returns for followup visit today. Overall she's doing well. She denies any fevers chills night sweats headaches shortness of breath chest pains palpitations. Her mammograms are up to date. Remainder of the 10 point review of systems is negative  MEDICAL HISTORY: Past Medical History  Diagnosis Date  . Malignant neoplasm of breast (female), unspecified site 03/13/2011  . CEREBROVASCULAR ACCIDENT, HX OF 08/24/2006  . HYPERTENSION 08/24/2006  . HYPERLIPIDEMIA 08/24/2006  . GERD 08/24/2006  . Stroke   . Blind right eye 07/04/2011    Since stroke 2003  . Impaired glucose tolerance 07/04/2011    ALLERGIES:  has No Known Allergies.  MEDICATIONS:  Current Outpatient Prescriptions  Medication Sig Dispense Refill  . amLODipine (NORVASC) 5 MG tablet Take 1 tablet (5 mg total) by mouth daily.  90 tablet  3  . aspirin 81 MG tablet Take 81 mg by mouth daily.       . cholecalciferol (VITAMIN D) 400 UNITS TABS Take by mouth.        . clopidogrel (PLAVIX) 75 MG tablet Take 1 tablet (75 mg total) by mouth daily.      . famotidine (PEPCID) 40 MG tablet Take 1 tablet (40 mg total) by mouth daily.  90 tablet  3  . hydrochlorothiazide (HYDRODIURIL) 12.5 MG tablet Take 1 tablet (12.5 mg total) by mouth daily.  90 tablet  3  . lisinopril (PRINIVIL,ZESTRIL) 20 MG tablet Take 20 mg by mouth daily.      . metoprolol succinate (TOPROL-XL) 50 MG 24 hr tablet Take 1  tablet (50 mg total) by mouth daily.  90 tablet  3  . potassium chloride (K-DUR) 10 MEQ tablet Take 1 tablet (10 mEq total) by mouth daily.  30 tablet  0  . rosuvastatin (CRESTOR) 10 MG tablet Take 1 tablet (10 mg total) by mouth daily.  90 tablet  3  . cyclobenzaprine (FLEXERIL) 10 MG tablet Take 1 tablet (10 mg total) by mouth 3 (three) times daily as needed for muscle spasms.  30 tablet  0  . HYDROcodone-acetaminophen (NORCO/VICODIN) 5-325 MG per tablet Take 1-2 tablets by mouth every 4 (four) hours as needed.  30 tablet  0   No current facility-administered medications for this visit.    SURGICAL HISTORY:  Past Surgical History  Procedure Laterality Date  . Abdominal hysterectomy  1995  . Colonoscopy N/A 02/05/2013    Procedure: COLONOSCOPY;  Surgeon: Theda Belfast, MD;  Location: [facility];  Service: Endoscopy;  Laterality: N/A;    REVIEW OF SYSTEMS:  Pertinent items are noted in HPI.   HEALTH MAINTENANCE:  PHYSICAL EXAMINATION: Blood pressure 167/89, pulse 84, temperature 98.5 F (36.9 C), temperature source Oral, resp. rate 20, height 5' 3.5" (1.613 m), weight 205 lb 1.6 oz (93.033 kg). Body mass index is 35.76 kg/(m^2). ECOG PERFORMANCE STATUS: 0 - Asymptomatic   General appearance: alert, cooperative and appears stated age Lymph nodes: Cervical, supraclavicular, and axillary nodes normal. Resp: clear to auscultation bilaterally Cardio:  regular rate and rhythm GI: soft, non-tender; bowel sounds normal; no masses,  no organomegaly Extremities: extremities normal, atraumatic, no cyanosis or edema Neurologic: Grossly normal   LABORATORY DATA: Lab Results  Component Value Date   WBC 7.6 02/05/2013   HGB 10.7* 02/05/2013   HCT 33.9* 02/05/2013   MCV 89.4 02/05/2013   PLT 189 02/05/2013      Chemistry      Component Value Date/Time   NA 134* 02/05/2013 0540   NA 141 02/25/2012 1005   K 3.4* 02/05/2013 0540   K 3.6 02/25/2012 1005   CL 100 02/05/2013 0540   CL  106 02/25/2012 1005   CO2 27 02/05/2013 0540   CO2 27 02/25/2012 1005   BUN 6 02/05/2013 0540   BUN 11.0 02/25/2012 1005   CREATININE 0.85 02/05/2013 0540   CREATININE 0.7 02/25/2012 1005      Component Value Date/Time   CALCIUM 8.8 02/05/2013 0540   CALCIUM 9.6 02/25/2012 1005   ALKPHOS 74 02/04/2013 0529   ALKPHOS 106 02/25/2012 1005   AST 15 02/04/2013 0529   AST 15 02/25/2012 1005   ALT 11 02/04/2013 0529   ALT 12 02/25/2012 1005   BILITOT 0.2* 02/04/2013 0529   BILITOT 0.58 02/25/2012 1005       RADIOGRAPHIC STUDIES:  Ct Abdomen Pelvis W Contrast  02/03/2013   CLINICAL DATA:  Pain right lower back area, right abdomen began last night, diarrhea and bloody stool  EXAM: CT ABDOMEN AND PELVIS WITH CONTRAST  TECHNIQUE: Multidetector CT imaging of the abdomen and pelvis was performed using the standard protocol following bolus administration of intravenous contrast.  CONTRAST:  OMNIPAQUE IOHEXOL 300 MG/ML  SOLN  COMPARISON:  07/18/2010  FINDINGS: Visualized portions of the lung bases clear.  Liver normal except for a 1 cm round low-attenuation lesion in the inferior tip of the right lobe. The gallbladder normal. Spleen and pancreas are normal. Adrenal glands are normal. There is a 6 mm low-attenuation lesion lower pole right kidney. There is a 6 mm low-attenuation lesion upper pole right kidney. Kidneys are otherwise normal. There is calcification of the aorta with no dilatation.  An appendix is not identified. There is moderate diverticulosis of the sigmoid colon and mild diverticulosis of the descending colon. There is an area of moderate colon wall thickening at the splenic flexure, measuring about 8 cm in length. There is mild wall thickening involving distal transverse colon. Small bowel is normal.  Bladder is normal. Reproductive organs appear to be absent. There is no free fluid. There is no acute musculoskeletal abnormality.  IMPRESSION: 1. Diverticulosis of the distal colon. 2.  Moderate wall thickening involving the splenic flexure of the colon without evidence of diverticulosis in this area. There is also mild wall thickening involving the distal 3rd of the transverse colon. Given the rather diffuse nature of this process, it is suspected that the abnormality may be due to colitis or inflammatory bowel disease. Colon carcinoma is not excluded. Other possibilities would include bowel wall ischemic change. 3. Focal lesions involving inferior right lobe of the liver, upper pole right kidney, and lower pole right kidney, also too small to characterize but likely cysts.   Electronically Signed   By: Esperanza Heir M.D.   On: 02/03/2013 18:02    ASSESSMENT:  62 year old female with history of triple-negative left breast cancer. She initially received neoadjuvant chemotherapy followed by lumpectomy and sentinel lymph node biopsy on 11/25/2006. She then went on to complete radiation  therapy. She is without any evidence of recurrent disease. She is 6 years out since her original diagnosis and treatment.   PLAN:  Patient will continue to follow up with [place] on a yearly basis.   All questions were answered. The patient knows to call the clinic with any problems, questions or concerns. We can certainly see the patient much sooner if necessary.  I spent 15 minutes counseling the patient face to face. The total time spent in the appointment was 15 minutes.    Drue Second, MD Medical/Oncology [facility] [phone] (beeper) [phone] (Office)  02/27/2013, 10:42 AM

## 2013-02-27 NOTE — Telephone Encounter (Signed)
appts made and printed...td 

## 2013-05-02 ENCOUNTER — Other Ambulatory Visit: Payer: Self-pay | Admitting: Internal Medicine

## 2013-05-02 ENCOUNTER — Encounter: Payer: Self-pay | Admitting: Internal Medicine

## 2013-05-04 ENCOUNTER — Other Ambulatory Visit: Payer: Self-pay | Admitting: Internal Medicine

## 2013-05-04 MED ORDER — CLOPIDOGREL BISULFATE 75 MG PO TABS: 75.0000 mg | ORAL_TABLET | Freq: Every day | ORAL | Status: DC

## 2013-05-04 MED ORDER — FAMOTIDINE 40 MG PO TABS: 40.0000 mg | ORAL_TABLET | Freq: Every day | ORAL | Status: DC

## 2013-05-04 MED ORDER — METOPROLOL SUCCINATE ER 50 MG PO TB24: 50.0000 mg | ORAL_TABLET | Freq: Every day | ORAL | Status: DC

## 2013-05-04 MED ORDER — ROSUVASTATIN CALCIUM 10 MG PO TABS: 10.0000 mg | ORAL_TABLET | Freq: Every day | ORAL | Status: DC

## 2013-05-04 MED ORDER — LISINOPRIL 20 MG PO TABS: 20.0000 mg | ORAL_TABLET | Freq: Every day | ORAL | Status: DC

## 2013-05-05 ENCOUNTER — Other Ambulatory Visit: Payer: Self-pay

## 2013-05-05 DIAGNOSIS — T148XXA Other injury of unspecified body region, initial encounter: Secondary | ICD-10-CM

## 2013-05-05 MED ORDER — CYCLOBENZAPRINE HCL 10 MG PO TABS: 10.0000 mg | ORAL_TABLET | Freq: Three times a day (TID) | ORAL | Status: DC | PRN

## 2013-05-05 MED ORDER — LISINOPRIL 20 MG PO TABS: 20.0000 mg | ORAL_TABLET | Freq: Every day | ORAL | Status: DC

## 2013-05-05 NOTE — Telephone Encounter (Signed)
Refill request by email done.

## 2013-05-10 ENCOUNTER — Encounter: Payer: Self-pay | Admitting: Internal Medicine

## 2013-05-11 MED ORDER — AMLODIPINE BESYLATE 5 MG PO TABS: 5.0000 mg | ORAL_TABLET | Freq: Every day | ORAL | Status: DC

## 2013-08-03 ENCOUNTER — Other Ambulatory Visit: Payer: Self-pay | Admitting: Nurse Practitioner

## 2013-08-03 DIAGNOSIS — Z9889 Other specified postprocedural states: Secondary | ICD-10-CM

## 2013-09-07 ENCOUNTER — Ambulatory Visit
Admission: RE | Admit: 2013-09-07 | Discharge: 2013-09-07 | Disposition: A | Discharge status: Home / Self Care | Payer: Commercial Managed Care - HMO | Source: Ambulatory Visit | Attending: Nurse Practitioner | Admitting: Nurse Practitioner

## 2013-09-07 DIAGNOSIS — Z9889 Other specified postprocedural states: Secondary | ICD-10-CM

## 2014-01-16 ENCOUNTER — Telehealth: Payer: Self-pay | Admitting: Hematology

## 2014-01-16 NOTE — Telephone Encounter (Signed)
CALLED PT RE NEW APPT FOR LB/VG. PT REQUESTS APPT BE W/SEHBAI. PT MADE AWARE SEHBAI MAY NOT REMAIN AND IS OK WITH THIS. PER PT HER SISTER CAME IN AS A NEW PT TO SEE SEHBAI AND THEY WERE VERY PLEASED WITH HIM. PT GIVEN NEW APPT FOR LB/AS 11/9.

## 2014-02-26 ENCOUNTER — Other Ambulatory Visit: Payer: Self-pay | Admitting: *Deleted

## 2014-02-26 DIAGNOSIS — C50919 Malignant neoplasm of unspecified site of unspecified female breast: Secondary | ICD-10-CM

## 2014-03-01 ENCOUNTER — Ambulatory Visit (HOSPITAL_BASED_OUTPATIENT_CLINIC_OR_DEPARTMENT_OTHER): Payer: Commercial Managed Care - HMO | Admitting: Hematology

## 2014-03-01 ENCOUNTER — Other Ambulatory Visit: Payer: Self-pay | Admitting: Hematology

## 2014-03-01 ENCOUNTER — Other Ambulatory Visit (HOSPITAL_BASED_OUTPATIENT_CLINIC_OR_DEPARTMENT_OTHER): Payer: Commercial Managed Care - HMO

## 2014-03-01 ENCOUNTER — Ambulatory Visit: Payer: Medicare Other | Admitting: Oncology

## 2014-03-01 ENCOUNTER — Other Ambulatory Visit: Payer: Medicare Other

## 2014-03-01 ENCOUNTER — Telehealth: Payer: Self-pay | Admitting: Hematology

## 2014-03-01 VITALS — BP 131/77 | HR 72 | Temp 98.0°F | Resp 16

## 2014-03-01 DIAGNOSIS — C50919 Malignant neoplasm of unspecified site of unspecified female breast: Secondary | ICD-10-CM

## 2014-03-01 DIAGNOSIS — C50912 Malignant neoplasm of unspecified site of left female breast: Secondary | ICD-10-CM

## 2014-03-01 DIAGNOSIS — R300 Dysuria: Secondary | ICD-10-CM

## 2014-03-01 DIAGNOSIS — Z1231 Encounter for screening mammogram for malignant neoplasm of breast: Secondary | ICD-10-CM

## 2014-03-01 DIAGNOSIS — Z853 Personal history of malignant neoplasm of breast: Secondary | ICD-10-CM

## 2014-03-01 DIAGNOSIS — D72829 Elevated white blood cell count, unspecified: Secondary | ICD-10-CM

## 2014-03-01 LAB — CBC WITH DIFFERENTIAL/PLATELET
BASO%: 0.1 % (ref 0.0–2.0)
Basophils Absolute: 0 10*3/uL (ref 0.0–0.1)
EOS%: 1.7 % (ref 0.0–7.0)
Eosinophils Absolute: 0.2 10*3/uL (ref 0.0–0.5)
HCT: 42.1 % (ref 34.8–46.6)
HGB: 13.6 g/dL (ref 11.6–15.9)
LYMPH%: 26.2 % (ref 14.0–49.7)
MCH: 29.4 pg (ref 25.1–34.0)
MCHC: 32.3 g/dL (ref 31.5–36.0)
MCV: 90.9 fL (ref 79.5–101.0)
MONO#: 0.9 10*3/uL (ref 0.1–0.9)
MONO%: 7.6 % (ref 0.0–14.0)
NEUT#: 7.6 10*3/uL — ABNORMAL HIGH (ref 1.5–6.5)
NEUT%: 64.4 % (ref 38.4–76.8)
Platelets: 225 10*3/uL (ref 145–400)
RBC: 4.63 10*6/uL (ref 3.70–5.45)
RDW: 16.4 % — ABNORMAL HIGH (ref 11.2–14.5)
WBC: 11.8 10*3/uL — ABNORMAL HIGH (ref 3.9–10.3)
lymph#: 3.1 10*3/uL (ref 0.9–3.3)

## 2014-03-01 LAB — COMPREHENSIVE METABOLIC PANEL (CC13)
ALT: 13 U/L (ref 0–55)
AST: 10 U/L (ref 5–34)
Albumin: 3.4 g/dL — ABNORMAL LOW (ref 3.5–5.0)
Alkaline Phosphatase: 100 U/L (ref 40–150)
Anion Gap: 9 mEq/L (ref 3–11)
BUN: 14.5 mg/dL (ref 7.0–26.0)
CO2: 32 mEq/L — ABNORMAL HIGH (ref 22–29)
Calcium: 9.4 mg/dL (ref 8.4–10.4)
Chloride: 101 mEq/L (ref 98–109)
Creatinine: 1 mg/dL (ref 0.6–1.1)
Glucose: 142 mg/dl — ABNORMAL HIGH (ref 70–140)
Potassium: 3.2 mEq/L — ABNORMAL LOW (ref 3.5–5.1)
Sodium: 142 mEq/L (ref 136–145)
Total Bilirubin: 0.38 mg/dL (ref 0.20–1.20)
Total Protein: 6.7 g/dL (ref 6.4–8.3)

## 2014-03-01 LAB — URINALYSIS, MICROSCOPIC - CHCC
Bilirubin (Urine): NEGATIVE
Glucose: NEGATIVE mg/dL
Ketones: NEGATIVE mg/dL
Nitrite: NEGATIVE
Protein: NEGATIVE mg/dL
Specific Gravity, Urine: 1.02 (ref 1.003–1.035)
Urobilinogen, UR: 0.2 mg/dL (ref 0.2–1)
pH: 6 (ref 4.6–8.0)

## 2014-03-01 MED ORDER — TRAMADOL HCL 50 MG PO TABS: 50.0000 mg | ORAL_TABLET | Freq: Two times a day (BID) | ORAL | Status: DC | PRN

## 2014-03-01 NOTE — Telephone Encounter (Signed)
Pt confirmed labs/ov/MM per 11/09 POF, gave pt AVS.... KJ

## 2014-03-01 NOTE — Progress Notes (Signed)
Wendy Heath OFFICE PROGRESS NOTE DATE OF SERVICE:03/01/2014  CC  Wendy Cower, MD [place] [address]  DIAGNOSIS: 11 BRCA negativefemale with history of left triple negative breast cancer diagnosed 2008  PRIOR THERAPY:  #1 patient originally presented with locally advanced triple-negative left breast cancer. She went on to receive neoadjuvant chemotherapy followed by lumpectomy and sentinel lymph node biopsy performed on 11/25/2006. She then received radiation therapy and has been on followup.  CURRENT THERAPY: followup  INTERVAL HISTORY: Wendy Heath 62 y.o. female returns for followup visit today last visit here was on 02/27/2013 with Dr Humphrey Rolls. Overall she's doing well. She denies any fevers chills night sweats headaches shortness of breath chest pains palpitations. Her mammograms are up to date.   I saw that in October 2014 she went to the ER with some right lower back pain and right abdominal pain and diarrhea with bloody stool. A CT scan of the abdomen and pelvis showed diverticulosis of the distal colon and also raise concern about some colitis in the splenic flexure of the colon. There were some cysts seen in the inferior right lobe of the liver and right kidney upper and lower poles which were too small.  According to patient she then had a colonoscopy which was negative at [facility] also have a younger sister who was diagnosed with stage IV breast cancer history does run in the family but no obvious 3 members who were negative for BRCA testing. Patient is complaining of mild dysuria and her white count is up at 11.8 cm testing urine analysis and culture today. Her last mammogram was 09/07/2013 which was BI-RADS Category 2 benign mammogram.   Remainder of the 10 point review of systems is negative  MEDICAL HISTORY: Past Medical History  Diagnosis Date  . Malignant neoplasm of breast (female), unspecified site  03/13/2011  . CEREBROVASCULAR ACCIDENT, HX OF 08/24/2006  . HYPERTENSION 08/24/2006  . HYPERLIPIDEMIA 08/24/2006  . GERD 08/24/2006  . Stroke   . Blind right eye 07/04/2011    Since stroke 2003  . Impaired glucose tolerance 07/04/2011    ALLERGIES:  has No Known Allergies.  MEDICATIONS:  Current Outpatient Prescriptions  Medication Sig Dispense Refill  . amLODipine (NORVASC) 5 MG tablet Take 1 tablet (5 mg total) by mouth daily. 90 tablet 3  . aspirin 81 MG tablet Take 81 mg by mouth daily.     . cholecalciferol (VITAMIN D) 400 UNITS TABS Take by mouth.      . clopidogrel (PLAVIX) 75 MG tablet Take 1 tablet (75 mg total) by mouth daily.    . famotidine (PEPCID) 40 MG tablet Take 1 tablet (40 mg total) by mouth daily. 90 tablet 3  . hydrochlorothiazide (HYDRODIURIL) 12.5 MG tablet Take 1 tablet (12.5 mg total) by mouth daily. 90 tablet 3  . lisinopril (PRINIVIL,ZESTRIL) 20 MG tablet Take 1 tablet (20 mg total) by mouth daily. 90 tablet 3  . metoprolol succinate (TOPROL-XL) 50 MG 24 hr tablet Take 1 tablet (50 mg total) by mouth daily. 90 tablet 3  . potassium chloride (K-DUR) 10 MEQ tablet Take 1 tablet (10 mEq total) by mouth daily. 30 tablet 0  . rosuvastatin (CRESTOR) 10 MG tablet Take 1 tablet (10 mg total) by mouth daily. 90 tablet 3  . traMADol (ULTRAM) 50 MG tablet Take 1 tablet (50 mg total) by mouth every 12 (twelve) hours as needed. 30 tablet 4   No current  facility-administered medications for this visit.    SURGICAL HISTORY:  Past Surgical History  Procedure Laterality Date  . Abdominal hysterectomy  1995  . Colonoscopy N/A 02/05/2013    Procedure: COLONOSCOPY;  Surgeon: Beryle Beams, MD;  Location: [facility];  Service: Endoscopy;  Laterality: N/A;    REVIEW OF SYSTEMS:  Pertinent items are noted in HPI.   HEALTH MAINTENANCE:  PHYSICAL EXAMINATION: Blood pressure 131/77, pulse 72, temperature 98 F (36.7 C), resp. rate 16, SpO2 100 %. There is no weight on file to  calculate BMI. ECOG PERFORMANCE STATUS: 0 - Asymptomatic   General appearance: alert, cooperative and appears stated age Lymph nodes: Cervical, supraclavicular, and axillary nodes normal. Resp: clear to auscultation bilaterally Cardio: regular rate and rhythm GI: soft, non-tender; bowel sounds normal; no masses,  no organomegaly Extremities: extremities normal, atraumatic, no cyanosis or edema Neurologic: Grossly normal Breast: No palpable lump or mass, no skin changes, no nipple changes. No adenopathy noted.  LABORATORY DATA: Lab Results  Component Value Date   WBC 11.8* 03/01/2014   HGB 13.6 03/01/2014   HCT 42.1 03/01/2014   MCV 90.9 03/01/2014   PLT 225 03/01/2014      Chemistry      Component Value Date/Time   NA 142 03/01/2014 1427   NA 134* 02/05/2013 0540   K 3.2* 03/01/2014 1427   K 3.4* 02/05/2013 0540   CL 100 02/05/2013 0540   CL 106 02/25/2012 1005   CO2 32* 03/01/2014 1427   CO2 27 02/05/2013 0540   BUN 14.5 03/01/2014 1427   BUN 6 02/05/2013 0540   CREATININE 1.0 03/01/2014 1427   CREATININE 0.85 02/05/2013 0540      Component Value Date/Time   CALCIUM 9.4 03/01/2014 1427   CALCIUM 8.8 02/05/2013 0540   ALKPHOS 100 03/01/2014 1427   ALKPHOS 74 02/04/2013 0529   AST 10 03/01/2014 1427   AST 15 02/04/2013 0529   ALT 13 03/01/2014 1427   ALT 11 02/04/2013 0529   BILITOT 0.38 03/01/2014 1427   BILITOT 0.2* 02/04/2013 0529       RADIOGRAPHIC STUDIES:  Ct Abdomen Pelvis W Contrast  02/03/2013   CLINICAL DATA:  Pain right lower back area, right abdomen began last night, diarrhea and bloody stool  EXAM: CT ABDOMEN AND PELVIS WITH CONTRAST  TECHNIQUE: Multidetector CT imaging of the abdomen and pelvis was performed using the standard protocol following bolus administration of intravenous contrast.  CONTRAST:  113m OMNIPAQUE IOHEXOL 300 MG/ML  SOLN  COMPARISON:  07/18/2010  FINDINGS: Visualized portions of the lung bases clear.  Liver normal except for a  1 cm round low-attenuation lesion in the inferior tip of the right lobe. The gallbladder normal. Spleen and pancreas are normal. Adrenal glands are normal. There is a 6 mm low-attenuation lesion lower pole right kidney. There is a 6 mm low-attenuation lesion upper pole right kidney. Kidneys are otherwise normal. There is calcification of the aorta with no dilatation.  An appendix is not identified. There is moderate diverticulosis of the sigmoid colon and mild diverticulosis of the descending colon. There is an area of moderate colon wall thickening at the splenic flexure, measuring about 8 cm in length. There is mild wall thickening involving distal transverse colon. Small bowel is normal.  Bladder is normal. Reproductive organs appear to be absent. There is no free fluid. There is no acute musculoskeletal abnormality.  IMPRESSION: 1. Diverticulosis of the distal colon. 2. Moderate wall thickening involving the splenic flexure  of the colon without evidence of diverticulosis in this area. There is also mild wall thickening involving the distal 3rd of the transverse colon. Given the rather diffuse nature of this process, it is suspected that the abnormality may be due to colitis or inflammatory bowel disease. Colon carcinoma is not excluded. Other possibilities would include bowel wall ischemic change. 3. Focal lesions involving inferior right lobe of the liver, upper pole right kidney, and lower pole right kidney, also too small to characterize but likely cysts.   Electronically Signed   By: Skipper Cliche M.D.   On: 02/03/2013 18:02    ASSESSMENT:  63 year old female with history of triple-negative left breast cancer. She initially received neoadjuvant chemotherapy followed by lumpectomy and sentinel lymph node biopsy on 11/25/2006. She then went on to complete radiation therapy. She is without any evidence of recurrent disease. She is 7 years out since her original diagnosis and treatment.   PLAN:    1.  Patient will continue to follow up with [place] on a yearly basis.  2. I ordered a urine analysis and culture and ask her to drink more fluids and cranberry juice. She has mild leukocytosis.   3. Her Ct scan showed colitis but colonoscopy was negative.   All questions were answered. The patient knows to call the clinic with any problems, questions or concerns. We can certainly see the patient much sooner if necessary.  I spent 15 minutes counseling the patient face to face. The total time spent in the appointment was 20 minutes.   Bernadene Bell, MD Medical Hematologist/Oncologist [place] Pager: [phone] Office No: [phone]

## 2014-03-04 LAB — URINE CULTURE

## 2014-03-08 ENCOUNTER — Telehealth: Payer: Self-pay

## 2014-03-08 NOTE — Telephone Encounter (Signed)
-----   Message from Bicknell, MD sent at 03/04/2014  9:07 AM EST ----- Juliann Pulse:  Can you call patient to see if she still has dysuria symptoms and if yes, we will need to collect a urine analysis and culture. If she has symptoms i won't mind calling an antibiotic also.  Thanks  aasim  ----- Message -----    From: [facility] Interface    Sent: 03/01/2014   2:36 PM      To: Aasim Marla Roe, MD

## 2014-03-08 NOTE — Telephone Encounter (Signed)
S/w pt and she is having frequency in urination but no other symptoms. She denies pain, urgency, pinkness. Instructed [place] to call if any symptoms recur.

## 2014-03-15 ENCOUNTER — Telehealth: Payer: Self-pay

## 2014-03-15 NOTE — Telephone Encounter (Signed)
lvm tried to return call from earlier. Will leave message for AM nurse to call her.

## 2014-03-16 ENCOUNTER — Other Ambulatory Visit: Payer: Commercial Managed Care - HMO

## 2014-03-16 ENCOUNTER — Ambulatory Visit: Payer: Self-pay | Admitting: Hematology and Oncology

## 2014-05-06 DIAGNOSIS — E780 Pure hypercholesterolemia: Secondary | ICD-10-CM | POA: Diagnosis not present

## 2014-05-06 DIAGNOSIS — I10 Essential (primary) hypertension: Secondary | ICD-10-CM | POA: Diagnosis not present

## 2014-05-06 DIAGNOSIS — J309 Allergic rhinitis, unspecified: Secondary | ICD-10-CM | POA: Diagnosis not present

## 2014-05-06 DIAGNOSIS — Z79899 Other long term (current) drug therapy: Secondary | ICD-10-CM | POA: Diagnosis not present

## 2014-05-06 DIAGNOSIS — N951 Menopausal and female climacteric states: Secondary | ICD-10-CM | POA: Diagnosis not present

## 2014-05-24 DIAGNOSIS — E119 Type 2 diabetes mellitus without complications: Secondary | ICD-10-CM | POA: Diagnosis not present

## 2014-07-23 ENCOUNTER — Other Ambulatory Visit: Payer: Self-pay | Admitting: Internal Medicine

## 2014-09-09 ENCOUNTER — Other Ambulatory Visit: Payer: Self-pay | Admitting: Hematology

## 2014-09-09 ENCOUNTER — Ambulatory Visit
Admission: RE | Admit: 2014-09-09 | Discharge: 2014-09-09 | Disposition: A | Discharge status: Home / Self Care | Payer: Commercial Managed Care - HMO | Source: Ambulatory Visit | Attending: Hematology | Admitting: Hematology

## 2014-09-09 DIAGNOSIS — Z1231 Encounter for screening mammogram for malignant neoplasm of breast: Secondary | ICD-10-CM | POA: Diagnosis not present

## 2014-10-18 ENCOUNTER — Other Ambulatory Visit: Payer: Self-pay

## 2015-01-12 DIAGNOSIS — Z79899 Other long term (current) drug therapy: Secondary | ICD-10-CM | POA: Diagnosis not present

## 2015-01-12 DIAGNOSIS — I10 Essential (primary) hypertension: Secondary | ICD-10-CM | POA: Diagnosis not present

## 2015-01-12 DIAGNOSIS — M25562 Pain in left knee: Secondary | ICD-10-CM | POA: Diagnosis not present

## 2015-01-12 DIAGNOSIS — E780 Pure hypercholesterolemia: Secondary | ICD-10-CM | POA: Diagnosis not present

## 2015-01-12 DIAGNOSIS — E119 Type 2 diabetes mellitus without complications: Secondary | ICD-10-CM | POA: Diagnosis not present

## 2015-01-12 DIAGNOSIS — J309 Allergic rhinitis, unspecified: Secondary | ICD-10-CM | POA: Diagnosis not present

## 2015-01-12 DIAGNOSIS — E668 Other obesity: Secondary | ICD-10-CM | POA: Diagnosis not present

## 2015-01-12 DIAGNOSIS — Z23 Encounter for immunization: Secondary | ICD-10-CM | POA: Diagnosis not present

## 2015-03-02 ENCOUNTER — Other Ambulatory Visit: Payer: Self-pay | Admitting: *Deleted

## 2015-03-02 DIAGNOSIS — C50912 Malignant neoplasm of unspecified site of left female breast: Secondary | ICD-10-CM

## 2015-03-03 ENCOUNTER — Other Ambulatory Visit: Payer: Commercial Managed Care - HMO

## 2015-03-03 ENCOUNTER — Encounter: Payer: Commercial Managed Care - HMO | Admitting: Hematology

## 2015-03-03 ENCOUNTER — Encounter: Payer: Self-pay | Admitting: Hematology

## 2015-03-03 NOTE — Progress Notes (Signed)
No show  This encounter was created in error - please disregard.

## 2015-03-05 ENCOUNTER — Telehealth: Payer: Self-pay | Admitting: Hematology

## 2015-03-05 NOTE — Telephone Encounter (Signed)
Returned call re appointment. Patient missed appointment 11/10. Gave patient new appointment for 12/2.

## 2015-03-25 ENCOUNTER — Other Ambulatory Visit (HOSPITAL_BASED_OUTPATIENT_CLINIC_OR_DEPARTMENT_OTHER): Payer: Commercial Managed Care - HMO

## 2015-03-25 ENCOUNTER — Encounter: Payer: Self-pay | Admitting: Hematology

## 2015-03-25 ENCOUNTER — Ambulatory Visit (HOSPITAL_BASED_OUTPATIENT_CLINIC_OR_DEPARTMENT_OTHER): Payer: Commercial Managed Care - HMO | Admitting: Hematology

## 2015-03-25 ENCOUNTER — Telehealth: Payer: Self-pay | Admitting: Hematology

## 2015-03-25 VITALS — BP 125/78 | HR 69 | Temp 97.6°F | Resp 18 | Ht 63.5 in | Wt 175.1 lb

## 2015-03-25 DIAGNOSIS — Z853 Personal history of malignant neoplasm of breast: Secondary | ICD-10-CM | POA: Insufficient documentation

## 2015-03-25 DIAGNOSIS — C50912 Malignant neoplasm of unspecified site of left female breast: Secondary | ICD-10-CM

## 2015-03-25 HISTORY — DX: Personal history of malignant neoplasm of breast: Z85.3

## 2015-03-25 LAB — CBC WITH DIFFERENTIAL/PLATELET
BASO%: 0.8 % (ref 0.0–2.0)
Basophils Absolute: 0.1 10*3/uL (ref 0.0–0.1)
EOS%: 3.7 % (ref 0.0–7.0)
Eosinophils Absolute: 0.3 10*3/uL (ref 0.0–0.5)
HCT: 42.7 % (ref 34.8–46.6)
HGB: 13.7 g/dL (ref 11.6–15.9)
LYMPH%: 29.8 % (ref 14.0–49.7)
MCH: 29.8 pg (ref 25.1–34.0)
MCHC: 32.2 g/dL (ref 31.5–36.0)
MCV: 92.7 fL (ref 79.5–101.0)
MONO#: 0.6 10*3/uL (ref 0.1–0.9)
MONO%: 8.8 % (ref 0.0–14.0)
NEUT#: 4 10*3/uL (ref 1.5–6.5)
NEUT%: 56.9 % (ref 38.4–76.8)
Platelets: 245 10*3/uL (ref 145–400)
RBC: 4.61 10*6/uL (ref 3.70–5.45)
RDW: 16.1 % — ABNORMAL HIGH (ref 11.2–14.5)
WBC: 7 10*3/uL (ref 3.9–10.3)
lymph#: 2.1 10*3/uL (ref 0.9–3.3)

## 2015-03-25 LAB — COMPREHENSIVE METABOLIC PANEL
ALT: 10 U/L (ref 0–55)
AST: 17 U/L (ref 5–34)
Albumin: 3.6 g/dL (ref 3.5–5.0)
Alkaline Phosphatase: 83 U/L (ref 40–150)
Anion Gap: 10 mEq/L (ref 3–11)
BUN: 13.4 mg/dL (ref 7.0–26.0)
CO2: 28 mEq/L (ref 22–29)
Calcium: 10.1 mg/dL (ref 8.4–10.4)
Chloride: 102 mEq/L (ref 98–109)
Creatinine: 0.9 mg/dL (ref 0.6–1.1)
EGFR: 80 mL/min/{1.73_m2} — ABNORMAL LOW (ref >90)
Glucose: 115 mg/dl (ref 70–140)
Potassium: 3.5 mEq/L (ref 3.5–5.1)
Sodium: 139 mEq/L (ref 136–145)
Total Bilirubin: 0.49 mg/dL (ref 0.20–1.20)
Total Protein: 7.4 g/dL (ref 6.4–8.3)

## 2015-03-25 NOTE — Telephone Encounter (Signed)
per Dr Burr Medico seen pt in infusion-added appt

## 2015-03-25 NOTE — Progress Notes (Addendum)
[facility]  [phone] [phone]  Clinic Follow up Note   Patient Care Team: Biagio Borg, MD as PCP - General (Internal Medicine) 03/25/2015   DIAGNOSIS: history of left triple negative breast cancer diagnosed 2008  PRIOR THERAPY:  #1 patient originally presented with locally advanced triple-negative left breast cancer. She went on to receive neoadjuvant chemotherapy followed by lumpectomy and sentinel lymph node biopsy performed on 11/25/2006. She then received radiation therapy and has been on followup.  CURRENT THERAPY: Surveillance   INTERVAL HISTORY: Wendy Heath is a 64 yo African-American female with past medical history of breast cancer in 2008, is here for her annual follow-up. Wall for her sisters is currently getting treatment for her metastatic breast cancer under my care. This is my first encounter with her. I reviewed her chart extensively and confirmed with patient.  She is doing well overall. She has bilateral knee pain from arthritis, she thinks she may need injection. She does not use cane, she takes ibuprofen sometime. She denies any other significant pain, dyspnea or other symptoms. She has good appetite and energy level, weight is stable.  REVIEW OF SYSTEMS:   Constitutional: Denies fevers, chills or abnormal weight loss Eyes: Denies blurriness of vision Ears, nose, mouth, throat, and face: Denies mucositis or sore throat Respiratory: Denies cough, dyspnea or wheezes Cardiovascular: Denies palpitation, chest discomfort or lower extremity swelling Gastrointestinal:  Denies nausea, heartburn or change in bowel habits Skin: Denies abnormal skin rashes Lymphatics: Denies new lymphadenopathy or easy bruising Neurological:Denies numbness, tingling or new weaknesses Behavioral/Psych: Mood is stable, no new changes  All other systems were reviewed with the patient and are negative.  MEDICAL HISTORY:  Past Medical History    Diagnosis Date  . Malignant neoplasm of breast (female), unspecified site 03/13/2011  . CEREBROVASCULAR ACCIDENT, HX OF 08/24/2006  . HYPERTENSION 08/24/2006  . HYPERLIPIDEMIA 08/24/2006  . GERD 08/24/2006  . Stroke ([place])   . Blind right eye 07/04/2011    Since stroke 2003  . Impaired glucose tolerance 07/04/2011    SURGICAL HISTORY: Past Surgical History  Procedure Laterality Date  . Abdominal hysterectomy  1995  . Colonoscopy N/A 02/05/2013    Procedure: COLONOSCOPY;  Surgeon: Beryle Beams, MD;  Location: [facility];  Service: Endoscopy;  Laterality: N/A;    I have reviewed the social history and family history with the patient and they are unchanged from previous note.  ALLERGIES:  has No Known Allergies.  MEDICATIONS:  Current Outpatient Prescriptions  Medication Sig Dispense Refill  . amLODipine (NORVASC) 5 MG tablet     . aspirin 81 MG tablet Take 81 mg by mouth daily.     . cholecalciferol (VITAMIN D) 400 UNITS TABS Take by mouth.      . clopidogrel (PLAVIX) 75 MG tablet Take 1 tablet (75 mg total) by mouth daily.    . famotidine (PEPCID) 40 MG tablet Take 1 tablet (40 mg total) by mouth daily. 90 tablet 3  . hydrochlorothiazide (HYDRODIURIL) 12.5 MG tablet Take 1 tablet (12.5 mg total) by mouth daily. 90 tablet 3  . lisinopril (PRINIVIL,ZESTRIL) 20 MG tablet Take 1 tablet (20 mg total) by mouth daily. 90 tablet 3  . meloxicam (MOBIC) 7.5 MG tablet     . metoprolol succinate (TOPROL-XL) 50 MG 24 hr tablet Take 1 tablet (50 mg total) by mouth daily. 90 tablet 3  . potassium chloride (K-DUR) 10 MEQ tablet Take 1 tablet (10 mEq total) by mouth daily.  30 tablet 0  . rosuvastatin (CRESTOR) 10 MG tablet Take 1 tablet (10 mg total) by mouth daily. 90 tablet 3  . traMADol (ULTRAM) 50 MG tablet Take 1 tablet (50 mg total) by mouth every 12 (twelve) hours as needed. 30 tablet 4   No current facility-administered medications for this visit.    PHYSICAL EXAMINATION: ECOG  PERFORMANCE STATUS: 1 - Symptomatic but completely ambulatory  Filed Vitals:   03/25/15 1255  BP: 125/78  Pulse: 69  Temp: 97.6 F (36.4 C)  Resp: 18   Filed Weights   03/25/15 1255  Weight: 175 lb 1.6 oz (79.425 kg)    GENERAL:alert, no distress and comfortable SKIN: skin color, texture, turgor are normal, no rashes or significant lesions EYES: normal, Conjunctiva are pink and non-injected, sclera clear OROPHARYNX:no exudate, no erythema and lips, buccal mucosa, and tongue normal  NECK: supple, thyroid normal size, non-tender, without nodularity LYMPH:  no palpable lymphadenopathy in the cervical, axillary or inguinal LUNGS: clear to auscultation and percussion with normal breathing effort HEART: regular rate & rhythm and no murmurs and no lower extremity edema ABDOMEN:abdomen soft, non-tender and normal bowel sounds Musculoskeletal:no cyanosis of digits and no clubbing  NEURO: alert & oriented x 3 with fluent speech, no focal motor/sensory deficits Breasts: Breast inspection showed them to be symmetrical with no nipple discharge. Palpation of the breasts and axilla revealed no obvious mass that I could appreciate.  LABORATORY DATA:  I have reviewed the data as listed CBC Latest Ref Rng 03/25/2015 03/01/2014 02/05/2013  WBC 3.9 - 10.3 10e3/uL 7.0 11.8(H) 7.6  Hemoglobin 11.6 - 15.9 g/dL 13.7 13.6 10.7(L)  Hematocrit 34.8 - 46.6 % 42.7 42.1 33.9(L)  Platelets 145 - 400 10e3/uL 245 225 189     CMP Latest Ref Rng 03/25/2015 03/01/2014 02/05/2013  Glucose 70 - 140 mg/dl 115 142(H) 89  BUN 7.0 - 26.0 mg/dL 13.4 14.5 6  Creatinine 0.6 - 1.1 mg/dL 0.9 1.0 0.85  Sodium 136 - 145 mEq/L 139 142 134(L)  Potassium 3.5 - 5.1 mEq/L 3.5 3.2(L) 3.4(L)  Chloride 96 - 112 mEq/L - - 100  CO2 22 - 29 mEq/L 28 32(H) 27  Calcium 8.4 - 10.4 mg/dL 10.1 9.4 8.8  Total Protein 6.4 - 8.3 g/dL 7.4 6.7 -  Total Bilirubin 0.20 - 1.20 mg/dL 0.49 0.38 -  Alkaline Phos 40 - 150 U/L 83 100 -  AST 5 - 34  U/L 17 10 -  ALT 0 - 55 U/L 10 13 -   Pathology report Patient Name: Wendy Heath, Wendy Heath. Office Chart Number: N/A  MRN: [id number] Pathologist: Neldon Mc, MD DOB/Age 06-05-1950 (Age: 62) Gender: F Date Taken: 11/25/2006 Date Received: 11/25/2006  FINAL DIAGNOSIS  MICROSCOPIC EXAMINATION AND DIAGNOSIS  1. LYMPH NODE, LEFT AXILLARY SENTINEL, BIOPSY: - ONE LYMPH NODE NEGATIVE FOR TUMOR. - FINDINGS CONSISTENT WITH PREVIOUS BIOPSY SITE REACTION. - BENIGN LYMPHOID HYPERPLASIA.  2. BREAST, LEFT, WIRE/NEEDLE LOCALIZED PARTIAL MASTECTOMY: - RESIDUAL INVASIVE DUCTAL CARCINOMA, 1.3 CM, MSBR II OF III. - MARGINS NEGATIVE WITH THE NEAREST MARGIN THE SUPERIOR MARGIN AT 0.4 CM. - RARE CALCIFICATIONS ASSOCIATED WITH SCLEROTIC LOBULES. - SKIN UNREMARKABLE.    RADIOGRAPHIC STUDIES: I have personally reviewed the radiological images as listed and agreed with the findings in the report. No results found.   ASSESSMENT & PLAN:  64 year old African-American female, postmenopausal,   1. History of left breast invasive ductal carcinoma, [id number], stage I, triple negative -It has been 8 years since her initial diagnosis  and treatment. We discussed that the risk of cancer recurrence is minimal now. Triple negative breast cancer very rarely recur after 5 years. -She is clinically doing very well, exam and last mammogram showed no evidence of disease recurrence -I reviewed her lab results, CBC and CMP are within normal limits -I encouraged her to continue annual screening mammogram, self-exam and follow with [place] once a year. -We discussed having a diet and exercise, she is fairly active.  2. Bone health  -Her bone density scan in 2008 was normal -I encouraged her to repeat a bone density scan. -I encouraged her to take calcium and vitamin D  3. HTN, arthritis -She'll continue follow-up with her primary care physician  Follow-up, she will return in 1 year with lab. Mammogram in  May 2017   Orders Placed This Encounter  Procedures  . MM Digital Screening    Standing Status: Future     Number of Occurrences:      Standing Expiration Date: 03/24/2016    Order Specific Question:  Reason for Exam (SYMPTOM  OR DIAGNOSIS REQUIRED)    Answer:  SCREENING    Order Specific Question:  Preferred imaging location?    Answer:  [facility]   All questions were answered. The patient knows to call the clinic with any problems, questions or concerns. No barriers to learning was detected. I spent 25 minutes counseling the patient face to face. The total time spent in the appointment was 30 minutes and more than 50% was on counseling and review of test results     Truitt Merle, MD 03/25/2015 1:46 PM

## 2015-03-28 ENCOUNTER — Telehealth: Payer: Self-pay | Admitting: Hematology

## 2015-03-28 ENCOUNTER — Other Ambulatory Visit: Payer: Self-pay | Admitting: Hematology

## 2015-03-28 DIAGNOSIS — Z1231 Encounter for screening mammogram for malignant neoplasm of breast: Secondary | ICD-10-CM

## 2015-03-28 NOTE — Telephone Encounter (Signed)
made mamma appt-pt aware

## 2015-05-06 DIAGNOSIS — Z8673 Personal history of transient ischemic attack (TIA), and cerebral infarction without residual deficits: Secondary | ICD-10-CM | POA: Diagnosis not present

## 2015-05-06 DIAGNOSIS — R202 Paresthesia of skin: Secondary | ICD-10-CM | POA: Diagnosis not present

## 2015-07-13 ENCOUNTER — Ambulatory Visit (INDEPENDENT_AMBULATORY_CARE_PROVIDER_SITE_OTHER): Payer: Commercial Managed Care - HMO | Admitting: Internal Medicine

## 2015-07-13 ENCOUNTER — Encounter: Payer: Self-pay | Admitting: Internal Medicine

## 2015-07-13 ENCOUNTER — Telehealth: Payer: Self-pay | Admitting: Internal Medicine

## 2015-07-13 VITALS — BP 135/89 | HR 77 | Temp 98.1°F | Wt 174.7 lb

## 2015-07-13 DIAGNOSIS — M7712 Lateral epicondylitis, left elbow: Secondary | ICD-10-CM | POA: Diagnosis not present

## 2015-07-13 DIAGNOSIS — M79602 Pain in left arm: Secondary | ICD-10-CM | POA: Diagnosis not present

## 2015-07-13 MED ORDER — GABAPENTIN 100 MG PO CAPS: ORAL_CAPSULE | ORAL | Status: DC

## 2015-07-13 MED ORDER — DICLOFENAC SODIUM 1 % TD GEL: 2.0000 g | Freq: Four times a day (QID) | TRANSDERMAL | Status: DC

## 2015-07-13 MED ORDER — IBUPROFEN 400 MG PO TABS: 400.0000 mg | ORAL_TABLET | Freq: Three times a day (TID) | ORAL | Status: DC | PRN

## 2015-07-13 MED ORDER — KETOROLAC TROMETHAMINE 30 MG/ML IJ SOLN
30.0000 mg | Freq: Once | INTRAMUSCULAR | Status: AC
  Administered 2015-07-13: 30 mg via INTRAMUSCULAR

## 2015-07-13 NOTE — Telephone Encounter (Signed)
Prior Authorization received from [employer] for Diclofenac 1% gel. Formulary and PA form placed in provider box for completion. Derl Barrow, RN

## 2015-07-13 NOTE — Patient Instructions (Signed)
It was so nice to meet you!  I have prescribed some Gabapentin, Ibuprofen, and Voltaren gel for your arm pain. Please take this as prescribed.  We will see you back in 1 month to follow-up on your arm pain and to talk about your blood sugars.  If you need anything in the meantime, please don't hesitate to call our office at [phone].  -Dr. Brett Albino

## 2015-07-13 NOTE — Assessment & Plan Note (Signed)
Pt with left forearm pain for 1 month. Not associated with overuse or trauma. Having some associated numbness, burning, and tingling. On exam, she is exquisitely tender where the tendon attaches to the lateral epicondyle with additional tenderness down the length of the tendon. I think she is having tendinitis that may be putting pressure on her nerves. - Pt states she is in horrible pain today, so will give her a 30mg  Toradol injection in the office - Ibuprofen 400mg  tid - Voltaren gel 1% to be applied to the area that is tender - Gabapentin 100mg  in the morning, 100mg  at lunch, and 300mg  in the evening to help with nerve pain and with sleep - Follow-up in 1 month

## 2015-07-13 NOTE — Progress Notes (Signed)
[facility] Phone: [phone]  Subjective:  Pt is here to establish care and to meet her new PCP.    -Left forearm pain- Has been going on for over a month. She was told to take Tramadol and use an arm brace, which didn't help. She also used some leftover Prednisone for 3 days, which helped. The arm pain has been constant since it started. The pain feels like needles are poking her. The pain starts in the elbow and radiates down to the hand. Her 4 fingers are numb. She has occasional weakness in the hand. She has not had any x-rays of arm. The pain started out of the blue. She denies any falls or trauma to the area. No overuse of her arms. No redness or swelling, no fevers or chills.  ROS: See HPI for pertinent positives and negatives  Past Medical History- HTN, GERD, Hx stroke in 2005 with residual blindness in right eye, prediabetes, hx of left breast cancer  Past Surgical History- Hysterectomy  Allergies- none  Reviewed problem list.  Medications- reviewed and updated Current Outpatient Prescriptions  Medication Sig Dispense Refill  . amLODipine (NORVASC) 5 MG tablet     . aspirin 81 MG tablet Take 81 mg by mouth daily.     . cholecalciferol (VITAMIN D) 400 UNITS TABS Take by mouth.      . clopidogrel (PLAVIX) 75 MG tablet Take 1 tablet (75 mg total) by mouth daily.    . famotidine (PEPCID) 40 MG tablet Take 1 tablet (40 mg total) by mouth daily. 90 tablet 3  . hydrochlorothiazide (HYDRODIURIL) 12.5 MG tablet Take 1 tablet (12.5 mg total) by mouth daily. 90 tablet 3  . lisinopril (PRINIVIL,ZESTRIL) 20 MG tablet Take 1 tablet (20 mg total) by mouth daily. 90 tablet 3  . meloxicam (MOBIC) 7.5 MG tablet     . metFORMIN (GLUCOPHAGE) 500 MG tablet Take 500 mg by mouth daily with breakfast.    . metoprolol succinate (TOPROL-XL) 50 MG 24 hr tablet Take 1 tablet (50 mg total) by mouth daily. 90 tablet 3  . potassium chloride (K-DUR) 10 MEQ tablet Take 1 tablet (10  mEq total) by mouth daily. 30 tablet 0  . rosuvastatin (CRESTOR) 10 MG tablet Take 1 tablet (10 mg total) by mouth daily. 90 tablet 3  . diclofenac sodium (VOLTAREN) 1 % GEL Apply 2 g topically 4 (four) times daily. 100 g 0  . gabapentin (NEURONTIN) 100 MG capsule Take 100mg  (1 tablet) in the morning, 100mg  (1 tablet) at lunch, and 300mg  (3 tablets) at bedtime 150 capsule 0  . ibuprofen (ADVIL,MOTRIN) 400 MG tablet Take 1 tablet (400 mg total) by mouth every 8 (eight) hours as needed. 90 tablet 0   Current Facility-Administered Medications  Medication Dose Route Frequency Provider Last Rate Last Dose  . ketorolac (TORADOL) 30 MG/ML injection 30 mg  30 mg Intramuscular Once Sela Hua, MD       Chief complaint-noted Family history reviewed for today's visit. No changes. Social history- patient is a current smoker. Smokes 5-6 cigarettes a day for 50 years.  Objective: BP 135/89 mmHg  Pulse 77  Temp(Src) 98.1 F (36.7 C) (Oral)  Wt 174 lb 11.2 oz (79.243 kg) Gen: NAD, alert, cooperative with exam, appears to be in pain, rubbing her arm HEENT: NCAT, EOMI, MMM, PERRLA Neck: FROM, supple, no cervical lymphadenopathy CV: RRR, no murmur Resp: CTABL, mild diffuse inspiratory and expiratory wheezing, normal work of breathing GI:  SNTND, BS present, no guarding or organomegaly Msk: No edema, warm, normal tone, moves UE/LE spontaneously Left arm: No deformity, edema, or erythema of the left forearm; exquisite tenderness to palpation where the tendon attaches to the lateral epicondyle with additional tenderness down the length of the tendon; fingertips are numb; no thenar atrophy or atrophy of other hand muscles Neuro: Alert and oriented, no gross deficits Skin: No rashes, no lesions Psych: Appropriate behavior  Assessment/Plan: Lateral Epicondylitis of the Left Elbow:  Pt with left forearm pain for 1 month. Not associated with overuse or trauma. Having some associated numbness, burning, and  tingling. On exam, she is exquisitely tender where the tendon attaches to the lateral epicondyle with additional tenderness down the length of the tendon. I think she is having tendinitis that may be putting pressure on her nerves. - Pt states she is in horrible pain today, so will give her a 30mg  Toradol injection in the office - Ibuprofen 400mg  tid - Voltaren gel 1% to be applied to the area that is tender - Gabapentin 100mg  in the morning, 100mg  at lunch, and 300mg  in the evening to help with nerve pain and with sleep - Follow-up in 1 month   Hyman Bible, MD PGY-1

## 2015-07-13 NOTE — Telephone Encounter (Signed)
The voltran gel cost $45.  Pt cannot pay this. Can dr order something else? Uses [facility] on [address]. Please call pt and let her know what the dr decides to do

## 2015-07-13 NOTE — Telephone Encounter (Signed)
Will forward to MD. Byrl Latin,CMA  

## 2015-07-14 NOTE — Telephone Encounter (Signed)
Please let Pt know that she can take Ibuprofen 400mg  three times a day, Gabapentin 100mg  in the morning, 100mg  at lunch time, and 300mg  at night for pain. She can also try some warm compresses or ice to the area. We will hear back about her Voltaren gel in the next 24 hours and Tamika will give her a call as soon as we hear back from the insurance company. Thank you!

## 2015-07-14 NOTE — Telephone Encounter (Signed)
PA form faxed to [facility] for review.  Review process could take 24-72 hours. Derl Barrow, RN

## 2015-07-14 NOTE — Telephone Encounter (Signed)
Please call patient back regarding needing something for arm pain.

## 2015-07-15 NOTE — Telephone Encounter (Signed)
Spoke with patient and she states that her arm pain seems to only have worsened since she was here.  She has picked up the gel but there is no relief with it.  She does plan to start the ibuprofen tomorrow and will call us on Monday if there is still no relief.  Will forward to MD to make her aware. Scotty Pinder,CMA

## 2015-07-19 NOTE — Telephone Encounter (Signed)
Per [facility] the brand name Voltaren is preferred. Derl Barrow, RN

## 2015-08-17 ENCOUNTER — Ambulatory Visit: Payer: Self-pay | Admitting: Internal Medicine

## 2015-08-18 ENCOUNTER — Ambulatory Visit: Payer: Self-pay | Admitting: Family Medicine

## 2015-09-02 ENCOUNTER — Ambulatory Visit (INDEPENDENT_AMBULATORY_CARE_PROVIDER_SITE_OTHER): Payer: Commercial Managed Care - HMO | Admitting: Internal Medicine

## 2015-09-02 ENCOUNTER — Encounter: Payer: Self-pay | Admitting: Internal Medicine

## 2015-09-02 VITALS — BP 142/88 | HR 84 | Temp 97.6°F | Ht 63.0 in | Wt 171.8 lb

## 2015-09-02 DIAGNOSIS — Z0000 Encounter for general adult medical examination without abnormal findings: Secondary | ICD-10-CM | POA: Insufficient documentation

## 2015-09-02 DIAGNOSIS — M79602 Pain in left arm: Secondary | ICD-10-CM

## 2015-09-02 DIAGNOSIS — R7303 Prediabetes: Secondary | ICD-10-CM | POA: Diagnosis not present

## 2015-09-02 DIAGNOSIS — R7309 Other abnormal glucose: Secondary | ICD-10-CM | POA: Diagnosis not present

## 2015-09-02 DIAGNOSIS — M79601 Pain in right arm: Secondary | ICD-10-CM | POA: Insufficient documentation

## 2015-09-02 DIAGNOSIS — R7302 Impaired glucose tolerance (oral): Secondary | ICD-10-CM

## 2015-09-02 DIAGNOSIS — I10 Essential (primary) hypertension: Secondary | ICD-10-CM

## 2015-09-02 LAB — POCT GLYCOSYLATED HEMOGLOBIN (HGB A1C): Hemoglobin A1C: 5.7

## 2015-09-02 MED ORDER — ROSUVASTATIN CALCIUM 10 MG PO TABS: 10.0000 mg | ORAL_TABLET | Freq: Every day | ORAL | Status: DC

## 2015-09-02 MED ORDER — FAMOTIDINE 40 MG PO TABS: 40.0000 mg | ORAL_TABLET | Freq: Every day | ORAL | Status: DC

## 2015-09-02 MED ORDER — HYDROCHLOROTHIAZIDE 12.5 MG PO TABS: 12.5000 mg | ORAL_TABLET | Freq: Every day | ORAL | Status: DC

## 2015-09-02 MED ORDER — IBUPROFEN 400 MG PO TABS: 400.0000 mg | ORAL_TABLET | Freq: Three times a day (TID) | ORAL | Status: DC | PRN

## 2015-09-02 MED ORDER — GABAPENTIN 300 MG PO CAPS: ORAL_CAPSULE | ORAL | Status: DC

## 2015-09-02 NOTE — Assessment & Plan Note (Signed)
BP 142/88. Goal 140/90. Currently taking Norvasc 5mg  daily, HCTZ 12.5mg  daily, and Lisinopril 20mg  daily.  - Continue current regimen - Will get BMET today to monitor Cr and electrolytes, as she is on HCTZ - Will monitor blood pressures closely at follow-up visits.

## 2015-09-02 NOTE — Patient Instructions (Addendum)
It was so nice to see you!  I have ordered some labs today- Hemoglobin A1c (tests for diabetes), Hepatitis C screening, and a metabolic panel (to check your liver and kidney function). I will call you with these results next week.  For your arm pain, please get a cock-up wrist splint from the pharmacy. You should wear this at night. You should also take Gabapentin 300mg  three times a day.  -Dr. Brett Albino

## 2015-09-02 NOTE — Progress Notes (Signed)
[facility] Phone: [phone]  Subjective:  Follow-up left arm pain: She was seen in March for left arm pain. At that time, she had sharp pain directly over the tendon that attaches to the lateral epicondyle. She was given a Toradol shot, Ibuprofen, and Voltaren gel. She also had nerve pain and was given Gabapentin 100mg  in the morning and lunch and 300mg  at night. Since then, the sharp pain over the lateral epicondyle has resolved. Her nerve pain is unchanged. The pain feels like "pins and needles". The pain radiates to her hand and all of her fingers. Nothing makes the pain worse. The pain occurs off and on. The pain is better if she extends her wrist. No weakness in her left arm/hand. She has a history of carpal tunnel in her right arm requiring surgery in 1998. No fevers, no chills.  Pre-diabetes: Last HgbA1c 6.5%. She has never checked her blood sugars. She takes Metformin 500mg  daily. No polyuria, no polydipsia. No shakiness, no sweatiness.  24 hour food recall: Breakfast: banana; First meal of the day is around 3pm- chicken wings, peas, mac and cheese, glass of sweet tea.   Hypertension: Currently taking Norvasc 5mg  daily, HCTZ 12.5mg  daily, and Lisinopril 20mg  daily. No side effects from medications. No chest pain, no lower extremity edema.  ROS: See HPI for pertinent positives and negatives Past Medical History- HTN, prediabetes Reviewed problem list.  Medications- reviewed and updated Current Outpatient Prescriptions  Medication Sig Dispense Refill  . amLODipine (NORVASC) 5 MG tablet     . aspirin 81 MG tablet Take 81 mg by mouth daily.     . cholecalciferol (VITAMIN D) 400 UNITS TABS Take by mouth.      . clopidogrel (PLAVIX) 75 MG tablet Take 1 tablet (75 mg total) by mouth daily.    . diclofenac sodium (VOLTAREN) 1 % GEL Apply 2 g topically 4 (four) times daily. 100 g 0  . famotidine (PEPCID) 40 MG tablet Take 1 tablet (40 mg total) by mouth daily. 90  tablet 3  . gabapentin (NEURONTIN) 100 MG capsule Take 100mg  (1 tablet) in the morning, 100mg  (1 tablet) at lunch, and 300mg  (3 tablets) at bedtime 150 capsule 0  . hydrochlorothiazide (HYDRODIURIL) 12.5 MG tablet Take 1 tablet (12.5 mg total) by mouth daily. 90 tablet 3  . ibuprofen (ADVIL,MOTRIN) 400 MG tablet Take 1 tablet (400 mg total) by mouth every 8 (eight) hours as needed. 90 tablet 0  . lisinopril (PRINIVIL,ZESTRIL) 20 MG tablet Take 1 tablet (20 mg total) by mouth daily. 90 tablet 3  . metFORMIN (GLUCOPHAGE) 500 MG tablet Take 500 mg by mouth daily with breakfast.    . metoprolol succinate (TOPROL-XL) 50 MG 24 hr tablet Take 1 tablet (50 mg total) by mouth daily. 90 tablet 3  . potassium chloride (K-DUR) 10 MEQ tablet Take 1 tablet (10 mEq total) by mouth daily. 30 tablet 0  . rosuvastatin (CRESTOR) 10 MG tablet Take 1 tablet (10 mg total) by mouth daily. 90 tablet 3   No current facility-administered medications for this visit.   Chief complaint-noted Family history reviewed for today's visit. No changes. Social history- patient is a current smoker  Objective: There were no vitals taken for this visit. Gen: NAD, alert, cooperative with exam HEENT: NCAT, EOMI, MMM Neck: FROM CV: RRR, no murmur Resp: CTABL, no wheezes, normal work of breathing Msk: Left arm- no gross deformities, no swelling, no erythema, no tenderness to palpation, negative Tinnel's sign,  negative Phalen's test; left hand- no thenar atrophy Foot exam: No deformities, no ulcerations, no skin breakdown bilaterally, intact to touch and monofilament testing bilaterally, PT/DP pulses intact bilaterally Neuro: Alert and oriented, no gross deficits, 5/5 grip strength bilaterally Skin: No rashes, no lesions Psych: Appropriate behavior  Assessment/Plan: Left arm pain:  At her last visit, her exam was consistent with lateral epicondylitis, given that she had severe pain with palpation of the tendon that attaches to  the lateral epicondyle. Today, she does not have any tenderness to palpation. Her pain is consistent with nerve pain, as she describes it as "pins and needles". She has a history of carpal tunnel syndrome of her right wrist requiring surgery in 1998.  - Because she states that her pain is much improved when she extends her wrist, advised Pt to try a cock-up wrist splint at night. - Increased Gabapentin from 100mg  at breakfast and lunch and 300mg  at dinner to 300mg  tid. - Follow-up in 2 weeks if pain not improved  Prediabetes: Last A1c was 6.5% in 06/2012. Does not check her blood sugars at home. Currently taking Metformin 500mg  daily. - Hemoglobin A1c - Diabetic foot exam performed today and was normal. - Will increase Metformin as needed pending A1c results - Follow-up in 3 months  HTN: BP 142/88. Goal 140/90. Currently taking Norvasc 5mg  daily, HCTZ 12.5mg  daily, and Lisinopril 20mg  daily.  - Continue current regimen - Will get BMET today to monitor Cr and electrolytes, as she is on HCTZ - Will monitor blood pressures closely at follow-up visits.  Health care maintenance: - Hepatitis C screening performed today   Hyman Bible, MD PGY-1

## 2015-09-02 NOTE — Assessment & Plan Note (Signed)
At her last visit, her exam was consistent with lateral epicondylitis, given that she had severe pain with palpation of the tendon that attaches to the lateral epicondyle. Today, she does not have any tenderness to palpation. Her pain is consistent with nerve pain, as she describes it as "pins and needles". She has a history of carpal tunnel syndrome of her right wrist requiring surgery in 1998.  - Because she states that her pain is much improved when she extends her wrist, advised Pt to try a cock-up wrist splint at night. - Increased Gabapentin from 100mg  at breakfast and lunch and 300mg  at dinner to 300mg  tid. - Follow-up in 2 weeks if pain not improved

## 2015-09-02 NOTE — Assessment & Plan Note (Signed)
Last A1c was 6.5% in 06/2012. Does not check her blood sugars at home. Currently taking Metformin 500mg  daily. - Hemoglobin A1c - Diabetic foot exam performed today and was normal. - Will increase Metformin as needed pending A1c results - Follow-up in 3 months

## 2015-09-02 NOTE — Assessment & Plan Note (Addendum)
-   Hepatitis C screening performed today

## 2015-09-03 LAB — BASIC METABOLIC PANEL WITH GFR
BUN: 21 mg/dL (ref 7–25)
CO2: 29 mmol/L (ref 20–31)
Calcium: 9.8 mg/dL (ref 8.6–10.4)
Chloride: 103 mmol/L (ref 98–110)
Creat: 0.85 mg/dL (ref 0.50–0.99)
GFR, Est African American: 83 mL/min (ref >=60)
GFR, Est Non African American: 72 mL/min (ref >=60)
Glucose, Bld: 128 mg/dL — ABNORMAL HIGH (ref 65–99)
Potassium: 3.4 mmol/L — ABNORMAL LOW (ref 3.5–5.3)
Sodium: 144 mmol/L (ref 135–146)

## 2015-09-03 LAB — HEPATITIS C ANTIBODY: HCV Ab: NEGATIVE

## 2015-09-04 ENCOUNTER — Emergency Department (HOSPITAL_COMMUNITY): Payer: Commercial Managed Care - HMO

## 2015-09-04 ENCOUNTER — Emergency Department (HOSPITAL_COMMUNITY)
Admission: EM | Admit: 2015-09-04 | Discharge: 2015-09-04 | Disposition: A | Discharge status: Home / Self Care | Payer: Commercial Managed Care - HMO | Attending: Emergency Medicine | Admitting: Emergency Medicine

## 2015-09-04 ENCOUNTER — Encounter (HOSPITAL_COMMUNITY): Payer: Self-pay | Admitting: Family Medicine

## 2015-09-04 DIAGNOSIS — K219 Gastro-esophageal reflux disease without esophagitis: Secondary | ICD-10-CM | POA: Insufficient documentation

## 2015-09-04 DIAGNOSIS — E785 Hyperlipidemia, unspecified: Secondary | ICD-10-CM | POA: Insufficient documentation

## 2015-09-04 DIAGNOSIS — F17210 Nicotine dependence, cigarettes, uncomplicated: Secondary | ICD-10-CM | POA: Insufficient documentation

## 2015-09-04 DIAGNOSIS — Z8673 Personal history of transient ischemic attack (TIA), and cerebral infarction without residual deficits: Secondary | ICD-10-CM | POA: Insufficient documentation

## 2015-09-04 DIAGNOSIS — Z7902 Long term (current) use of antithrombotics/antiplatelets: Secondary | ICD-10-CM | POA: Insufficient documentation

## 2015-09-04 DIAGNOSIS — R109 Unspecified abdominal pain: Secondary | ICD-10-CM | POA: Diagnosis present

## 2015-09-04 DIAGNOSIS — Z79899 Other long term (current) drug therapy: Secondary | ICD-10-CM | POA: Diagnosis not present

## 2015-09-04 DIAGNOSIS — Z792 Long term (current) use of antibiotics: Secondary | ICD-10-CM | POA: Diagnosis not present

## 2015-09-04 DIAGNOSIS — Z853 Personal history of malignant neoplasm of breast: Secondary | ICD-10-CM | POA: Diagnosis not present

## 2015-09-04 DIAGNOSIS — Z791 Long term (current) use of non-steroidal anti-inflammatories (NSAID): Secondary | ICD-10-CM | POA: Diagnosis not present

## 2015-09-04 DIAGNOSIS — Z7984 Long term (current) use of oral hypoglycemic drugs: Secondary | ICD-10-CM | POA: Diagnosis not present

## 2015-09-04 DIAGNOSIS — Z79891 Long term (current) use of opiate analgesic: Secondary | ICD-10-CM | POA: Diagnosis not present

## 2015-09-04 DIAGNOSIS — I10 Essential (primary) hypertension: Secondary | ICD-10-CM | POA: Diagnosis not present

## 2015-09-04 DIAGNOSIS — Z7982 Long term (current) use of aspirin: Secondary | ICD-10-CM | POA: Diagnosis not present

## 2015-09-04 DIAGNOSIS — K5732 Diverticulitis of large intestine without perforation or abscess without bleeding: Secondary | ICD-10-CM | POA: Diagnosis not present

## 2015-09-04 HISTORY — DX: Diverticulitis of intestine, part unspecified, without perforation or abscess without bleeding: K57.92

## 2015-09-04 LAB — CBC
HCT: 43.1 % (ref 36.0–46.0)
Hemoglobin: 14.2 g/dL (ref 12.0–15.0)
MCH: 30.3 pg (ref 26.0–34.0)
MCHC: 32.9 g/dL (ref 30.0–36.0)
MCV: 92.1 fL (ref 78.0–100.0)
Platelets: 214 10*3/uL (ref 150–400)
RBC: 4.68 MIL/uL (ref 3.87–5.11)
RDW: 16.3 % — ABNORMAL HIGH (ref 11.5–15.5)
WBC: 11.5 10*3/uL — ABNORMAL HIGH (ref 4.0–10.5)

## 2015-09-04 LAB — COMPREHENSIVE METABOLIC PANEL
ALT: 13 U/L — ABNORMAL LOW (ref 14–54)
AST: 19 U/L (ref 15–41)
Albumin: 4.2 g/dL (ref 3.5–5.0)
Alkaline Phosphatase: 69 U/L (ref 38–126)
Anion gap: 10 (ref 5–15)
BUN: 15 mg/dL (ref 6–20)
CO2: 27 mmol/L (ref 22–32)
Calcium: 9.7 mg/dL (ref 8.9–10.3)
Chloride: 105 mmol/L (ref 101–111)
Creatinine, Ser: 0.72 mg/dL (ref 0.44–1.00)
GFR calc Af Amer: >60 mL/min (ref >60)
GFR calc non Af Amer: >60 mL/min (ref >60)
Glucose, Bld: 96 mg/dL (ref 65–99)
Potassium: 3.2 mmol/L — ABNORMAL LOW (ref 3.5–5.1)
Sodium: 142 mmol/L (ref 135–145)
Total Bilirubin: 0.9 mg/dL (ref 0.3–1.2)
Total Protein: 8.2 g/dL — ABNORMAL HIGH (ref 6.5–8.1)

## 2015-09-04 LAB — URINALYSIS, ROUTINE W REFLEX MICROSCOPIC
Bilirubin Urine: NEGATIVE
Glucose, UA: NEGATIVE mg/dL
Ketones, ur: NEGATIVE mg/dL
Nitrite: NEGATIVE
Protein, ur: NEGATIVE mg/dL
Specific Gravity, Urine: 1.022 (ref 1.005–1.030)
pH: 5.5 (ref 5.0–8.0)

## 2015-09-04 LAB — URINE MICROSCOPIC-ADD ON

## 2015-09-04 LAB — LIPASE, BLOOD: Lipase: 14 U/L (ref 11–51)

## 2015-09-04 MED ORDER — METRONIDAZOLE 500 MG PO TABS: 500.0000 mg | ORAL_TABLET | Freq: Three times a day (TID) | ORAL | Status: AC

## 2015-09-04 MED ORDER — IOPAMIDOL (ISOVUE-300) INJECTION 61%
100.0000 mL | Freq: Once | INTRAVENOUS | Status: AC | PRN
  Administered 2015-09-04: 100 mL via INTRAVENOUS

## 2015-09-04 MED ORDER — MORPHINE SULFATE (PF) 4 MG/ML IV SOLN
4.0000 mg | Freq: Once | INTRAVENOUS | Status: AC
  Administered 2015-09-04: 4 mg via INTRAVENOUS
  Filled 2015-09-04: qty 1

## 2015-09-04 MED ORDER — NAPROXEN 500 MG PO TABS: 500.0000 mg | ORAL_TABLET | Freq: Two times a day (BID) | ORAL | Status: DC

## 2015-09-04 MED ORDER — CIPROFLOXACIN HCL 500 MG PO TABS: 500.0000 mg | ORAL_TABLET | Freq: Two times a day (BID) | ORAL | Status: AC

## 2015-09-04 MED ORDER — HYDROCODONE-ACETAMINOPHEN 5-325 MG PO TABS: 2.0000 | ORAL_TABLET | ORAL | Status: DC | PRN

## 2015-09-04 MED ORDER — ONDANSETRON 4 MG PO TBDP: 4.0000 mg | ORAL_TABLET | Freq: Three times a day (TID) | ORAL | Status: DC | PRN

## 2015-09-04 MED ORDER — SODIUM CHLORIDE 0.9 % IV BOLUS (SEPSIS)
1000.0000 mL | Freq: Once | INTRAVENOUS | Status: AC
  Administered 2015-09-04: 1000 mL via INTRAVENOUS

## 2015-09-04 MED ORDER — ONDANSETRON HCL 4 MG/2ML IJ SOLN
4.0000 mg | Freq: Once | INTRAMUSCULAR | Status: AC
  Administered 2015-09-04: 4 mg via INTRAVENOUS
  Filled 2015-09-04: qty 2

## 2015-09-04 NOTE — ED Notes (Signed)
Patient is experiencing left lower abd pain that radiates to her back. Denies nausea, vomiting, diarrhea, or fever. Pt reports when she has a hard, sharp pain she has to urinate. Pt denies any increase in frequency, urgency, or blood in urine.

## 2015-09-04 NOTE — ED Provider Notes (Signed)
CSN: [id number]     Arrival date & time 09/04/15  1522 History   First MD Initiated Contact with Patient 09/04/15 1612     Chief Complaint  Patient presents with  . Abdominal Pain     (Consider location/radiation/quality/duration/timing/severity/associated sxs/prior Treatment) Patient is a 65 y.o. female presenting with abdominal pain.  Abdominal Pain Associated symptoms: chills and nausea   Associated symptoms: no chest pain, no constipation, no cough, no diarrhea, no dysuria, no fever, no shortness of breath, no sore throat and no vomiting      Left lower back pain radiating to lower left abdomen, comes and goes, feels like diverticulitis, severe pain, nausea but no vomiting, sharp pains, no diarrhea, chills no fever, no dysuria Started at 3AM  Past Medical History  Diagnosis Date  . Malignant neoplasm of breast (female), unspecified site 03/13/2011  . CEREBROVASCULAR ACCIDENT, HX OF 08/24/2006  . HYPERTENSION 08/24/2006  . HYPERLIPIDEMIA 08/24/2006  . GERD 08/24/2006  . Blind right eye 07/04/2011    Since stroke 2003  . Impaired glucose tolerance 07/04/2011  . Stroke ([place]) 2005  . Diverticulitis    Past Surgical History  Procedure Laterality Date  . Abdominal hysterectomy  1995  . Colonoscopy N/A 02/05/2013    Procedure: COLONOSCOPY;  Surgeon: Beryle Beams, MD;  Location: [facility];  Service: Endoscopy;  Laterality: N/A;   Family History  Problem Relation Age of Onset  . Stroke Other   . Cancer Other     breast cancer  . Cancer Other     breast cancer  . Stroke Other   . Hypertension Other   . Diabetes Other   . Sudden death Other   . Cancer Mother   . Heart disease Father   . Cancer Sister    Social History  Substance Use Topics  . Smoking status: Current Every Day Smoker -- 0.25 packs/day for 50 years    Types: Cigarettes  . Smokeless tobacco: None  . Alcohol Use: Yes     Comment: Once a week.    OB History    No data available     Review of Systems   Constitutional: Positive for chills. Negative for fever.  HENT: Negative for sore throat.   Eyes: Negative for visual disturbance.  Respiratory: Negative for cough and shortness of breath.   Cardiovascular: Negative for chest pain.  Gastrointestinal: Positive for nausea and abdominal pain. Negative for vomiting, diarrhea and constipation.  Genitourinary: Negative for dysuria and difficulty urinating.  Musculoskeletal: Negative for back pain and neck pain.  Skin: Negative for rash.  Neurological: Negative for syncope and headaches.      Allergies  Review of patient's allergies indicates no known allergies.  Home Medications   Prior to Admission medications   Medication Sig Start Date End Date Taking? Authorizing Provider  amLODipine (NORVASC) 5 MG tablet Take 5 mg by mouth daily.  01/13/15  Yes Historical Provider, MD  aspirin 81 MG tablet Take 81 mg by mouth daily.    Yes Historical Provider, MD  cholecalciferol (VITAMIN D) 400 UNITS TABS Take by mouth.     Yes Historical Provider, MD  clopidogrel (PLAVIX) 75 MG tablet Take 1 tablet (75 mg total) by mouth daily. 05/04/13  Yes Tresa Garter, MD  famotidine (PEPCID) 40 MG tablet Take 1 tablet (40 mg total) by mouth daily. 09/02/15  Yes Sela Hua, MD  gabapentin (NEURONTIN) 300 MG capsule Take 100mg  (1 tablet) in the morning, 100mg  (1 tablet) at  lunch, and 300mg  (3 tablets) at bedtime Patient taking differently: Take 300 mg by mouth 3 (three) times daily.  09/02/15  Yes Sela Hua, MD  hydrochlorothiazide (HYDRODIURIL) 12.5 MG tablet Take 1 tablet (12.5 mg total) by mouth daily. 09/02/15  Yes Sela Hua, MD  ibuprofen (ADVIL,MOTRIN) 400 MG tablet Take 1 tablet (400 mg total) by mouth every 8 (eight) hours as needed. 09/02/15  Yes Sela Hua, MD  lisinopril (PRINIVIL,ZESTRIL) 20 MG tablet Take 1 tablet (20 mg total) by mouth daily. 05/05/13  Yes Biagio Borg, MD  metFORMIN (GLUCOPHAGE) 500 MG tablet Take 500 mg by mouth  daily with breakfast.   Yes Historical Provider, MD  metoprolol succinate (TOPROL-XL) 50 MG 24 hr tablet Take 1 tablet (50 mg total) by mouth daily. 05/04/13  Yes Biagio Borg, MD  potassium chloride (K-DUR) 10 MEQ tablet Take 1 tablet (10 mEq total) by mouth daily. 02/07/13  Yes Adeline Saralyn Pilar, MD  rosuvastatin (CRESTOR) 10 MG tablet Take 1 tablet (10 mg total) by mouth daily. 09/02/15  Yes Sela Hua, MD  ciprofloxacin (CIPRO) 500 MG tablet Take 1 tablet (500 mg total) by mouth every 12 (twelve) hours. 09/04/15 09/14/15  Gareth Morgan, MD  diclofenac sodium (VOLTAREN) 1 % GEL Apply 2 g topically 4 (four) times daily. Patient not taking: Reported on 09/04/2015 07/13/15   Sela Hua, MD  HYDROcodone-acetaminophen (NORCO/VICODIN) 5-325 MG tablet Take 2 tablets by mouth every 4 (four) hours as needed. 09/04/15   Gareth Morgan, MD  metroNIDAZOLE (FLAGYL) 500 MG tablet Take 1 tablet (500 mg total) by mouth 3 (three) times daily. 09/04/15 09/14/15  Gareth Morgan, MD  naproxen (NAPROSYN) 500 MG tablet Take 1 tablet (500 mg total) by mouth 2 (two) times daily with a meal. 09/04/15   Gareth Morgan, MD  ondansetron (ZOFRAN ODT) 4 MG disintegrating tablet Take 1 tablet (4 mg total) by mouth every 8 (eight) hours as needed for nausea or vomiting. 09/04/15   Gareth Morgan, MD   BP 149/88 mmHg  Pulse 71  Temp(Src) 98 F (36.7 C) (Oral)  Resp 16  Ht 5\' 3"  (1.6 m)  Wt 171 lb (77.565 kg)  BMI 30.30 kg/m2  SpO2 98% Physical Exam  Constitutional: She is oriented to person, place, and time. She appears well-developed and well-nourished. No distress.  HENT:  Head: Normocephalic and atraumatic.  Eyes: Conjunctivae and EOM are normal.  Neck: Normal range of motion.  Cardiovascular: Normal rate, regular rhythm, normal heart sounds and intact distal pulses.  Exam reveals no gallop and no friction rub.   No murmur heard. Pulmonary/Chest: Effort normal and breath sounds normal. No respiratory distress.  She has no wheezes. She has no rales.  Abdominal: Soft. She exhibits no distension. There is tenderness in the left lower quadrant. There is guarding. There is no CVA tenderness.  Musculoskeletal: She exhibits no edema or tenderness.  Neurological: She is alert and oriented to person, place, and time.  Skin: Skin is warm and dry. No rash noted. She is not diaphoretic. No erythema.  Nursing note and vitals reviewed.   ED Course  Procedures (including critical care time) Labs Review Labs Reviewed  COMPREHENSIVE METABOLIC PANEL - Abnormal; Notable for the following:    Potassium 3.2 (*)    Total Protein 8.2 (*)    ALT 13 (*)    All other components within normal limits  CBC - Abnormal; Notable for the following:    WBC 11.5 (*)  RDW 16.3 (*)    All other components within normal limits  URINALYSIS, ROUTINE W REFLEX MICROSCOPIC (NOT AT [facility]) - Abnormal; Notable for the following:    APPearance CLOUDY (*)    Hgb urine dipstick MODERATE (*)    Leukocytes, UA MODERATE (*)    All other components within normal limits  URINE MICROSCOPIC-ADD ON - Abnormal; Notable for the following:    Squamous Epithelial / LPF 6-30 (*)    Bacteria, UA FEW (*)    All other components within normal limits  LIPASE, BLOOD    Imaging Review Ct Abdomen Pelvis W Contrast  09/04/2015  CLINICAL DATA:  Patient is experiencing left lower abd pain that radiates to her back. Denies nausea, vomiting, diarrhea, or fever. Pt reports when she has a hard, sharp pain she has to urinate. Pt denies any increase in frequency, urgency, or blood in urine. ^19mL ISOVUE-300 IOPAMIDOL (ISOVUE-300) INJECTION 61% EXAM: CT ABDOMEN AND PELVIS WITH CONTRAST TECHNIQUE: Multidetector CT imaging of the abdomen and pelvis was performed using the standard protocol following bolus administration of intravenous contrast. CONTRAST:  155mL ISOVUE-300 IOPAMIDOL (ISOVUE-300) INJECTION 61% COMPARISON:  02/03/2013 FINDINGS: Lower chest: No  pulmonary nodules, pleural effusions, or infiltrates. Heart size is normal. No imaged pericardial effusion or significant coronary artery calcifications. Upper abdomen: Small low-attenuation lesions are identified within the liver, stable in appearance. The largest is identified along the posterior segment of the right hepatic lobe at its inferior aspect measuring 1.0 cm and stable in appearance. Findings are most consistent with cysts. No focal abnormality identified within the spleen. Small splenule is identified. No focal abnormality identified within the pancreas or adrenal glands. There is symmetric enhancement and excretion both kidneys. Small low-attenuation lesions are identified within the kidneys and are too small to characterize. The most discrete lesion in the lower pole of the right kidney is consistent is cyst. The gallbladder is present. Gastrointestinal tract: The stomach and small bowel loops are normal in appearance. The appendix is well seen and has a normal appearance. There are multiple colonic diverticula. Within the sigmoid colon, there is significant inflammation associated numerous diverticula of the findings are consistent with acute diverticulitis. No perforation or abscess. Pelvis: The uterus is absent.  No adnexal mass. Retroperitoneum: There is significant atherosclerotic calcification of the abdominal aorta. Aorta is tortuous but not aneurysmal. No retroperitoneal or mesenteric adenopathy. Abdominal wall: Unremarkable. Osseous structures: Spondylitic changes identified in the thoracic and lumbar spine. No suspicious lytic or blastic lesions are identified. IMPRESSION: 1. Acute sigmoid diverticulitis. 2. No associated abscess or perforation. 3. Stable small liver lesions likely cysts. 4. Probable small renal cysts. 5. Normal appendix. 6. Status post hysterectomy. 7. Atherosclerosis of the abdominal aorta. Electronically Signed   By: Nolon Nations M.D.   On: 09/04/2015 17:35   I  have personally reviewed and evaluated these images and lab results as part of my medical decision-making.   EKG Interpretation None      MDM   Final diagnoses:  Diverticulitis of large intestine without perforation or abscess without bleeding   65 year old female with a history of hypertension, hyperlipidemia, CVA, prior diverticulitis presents with concern for left lower back and left lower quadrant pain. CT abdomen and pelvis showed acute sigmoid diverticulitis without perforation or abscess. Patient's pain was controlled with morphine in the emergency department. She is given a prescription for Flagyl and Cipro for 10 days, and instructed to follow-up with her primary care physician. Skin prescriptions for naproxen and Norco  for pain control, Zofran for nausea. Patient discharged in stable condition with understanding of reasons to return.   Gareth Morgan, MD 09/05/15 [phone]

## 2015-09-04 NOTE — Discharge Instructions (Signed)
Diverticulitis °Diverticulitis is inflammation or infection of small pouches in your colon that form when you have a condition called diverticulosis. The pouches in your colon are called diverticula. Your colon, or large intestine, is where water is absorbed and stool is formed. °Complications of diverticulitis can include: °· Bleeding. °· Severe infection. °· Severe pain. °· Perforation of your colon. °· Obstruction of your colon. °CAUSES  °Diverticulitis is caused by bacteria. °Diverticulitis happens when stool becomes trapped in diverticula. This allows bacteria to grow in the diverticula, which can lead to inflammation and infection. °RISK FACTORS °People with diverticulosis are at risk for diverticulitis. Eating a diet that does not include enough fiber from fruits and vegetables may make diverticulitis more likely to develop. °SYMPTOMS  °Symptoms of diverticulitis may include: °· Abdominal pain and tenderness. The pain is normally located on the left side of the abdomen, but may occur in other areas. °· Fever and chills. °· Bloating. °· Cramping. °· Nausea. °· Vomiting. °· Constipation. °· Diarrhea. °· Blood in your stool. °DIAGNOSIS  °Your health care provider will ask you about your medical history and do a physical exam. You may need to have tests done because many medical conditions can cause the same symptoms as diverticulitis. Tests may include: °· Blood tests. °· Urine tests. °· Imaging tests of the abdomen, including X-rays and CT scans. °When your condition is under control, your health care provider may recommend that you have a colonoscopy. A colonoscopy can show how severe your diverticula are and whether something else is causing your symptoms. °TREATMENT  °Most cases of diverticulitis are mild and can be treated at home. Treatment may include: °· Taking over-the-counter pain medicines. °· Following a clear liquid diet. °· Taking antibiotic medicines by mouth for 7-10 days. °More severe cases may  be treated at a hospital. Treatment may include: °· Not eating or drinking. °· Taking prescription pain medicine. °· Receiving antibiotic medicines through an IV tube. °· Receiving fluids and nutrition through an IV tube. °· Surgery. °HOME CARE INSTRUCTIONS  °· Follow your health care provider's instructions carefully. °· Follow a full liquid diet or other diet as directed by your health care provider. After your symptoms improve, your health care provider may tell you to change your diet. He or she may recommend you eat a high-fiber diet. Fruits and vegetables are good sources of fiber. Fiber makes it easier to pass stool. °· Take fiber supplements or probiotics as directed by your health care provider. °· Only take medicines as directed by your health care provider. °· Keep all your follow-up appointments. °SEEK MEDICAL CARE IF:  °· Your pain does not improve. °· You have a hard time eating food. °· Your bowel movements do not return to normal. °SEEK IMMEDIATE MEDICAL CARE IF:  °· Your pain becomes worse. °· Your symptoms do not get better. °· Your symptoms suddenly get worse. °· You have a fever. °· You have repeated vomiting. °· You have bloody or black, tarry stools. °MAKE SURE YOU:  °· Understand these instructions. °· Will watch your condition. °· Will get help right away if you are not doing well or get worse. °  °This information is not intended to replace advice given to you by your health care provider. Make sure you discuss any questions you have with your health care provider. °  °Document Released: 01/17/2005 Document Revised: 04/14/2013 Document Reviewed: 03/04/2013 °Elsevier Interactive Patient Education ©2016 [employer] ° °

## 2015-09-05 ENCOUNTER — Telehealth: Payer: Self-pay | Admitting: *Deleted

## 2015-09-05 DIAGNOSIS — M79602 Pain in left arm: Secondary | ICD-10-CM

## 2015-09-05 MED ORDER — GABAPENTIN 300 MG PO CAPS: 300.0000 mg | ORAL_CAPSULE | Freq: Three times a day (TID) | ORAL | Status: DC

## 2015-09-05 NOTE — Telephone Encounter (Signed)
Received a fax from [place] needing to clarify the direction for gabapentin.  Please give them a call at [phone]. Derl Barrow, RN

## 2015-09-05 NOTE — Telephone Encounter (Signed)
Spoke with [employer]. Pt should take Gabapentin 300mg  (1 capsule) tid.

## 2015-09-12 ENCOUNTER — Encounter: Payer: Self-pay | Admitting: Internal Medicine

## 2015-09-12 ENCOUNTER — Ambulatory Visit
Admission: RE | Admit: 2015-09-12 | Discharge: 2015-09-12 | Disposition: A | Discharge status: Home / Self Care | Payer: Commercial Managed Care - HMO | Source: Ambulatory Visit | Attending: Hematology | Admitting: Hematology

## 2015-09-12 DIAGNOSIS — Z1231 Encounter for screening mammogram for malignant neoplasm of breast: Secondary | ICD-10-CM | POA: Diagnosis not present

## 2015-10-31 ENCOUNTER — Other Ambulatory Visit: Payer: Self-pay | Admitting: *Deleted

## 2015-10-31 DIAGNOSIS — M79602 Pain in left arm: Secondary | ICD-10-CM

## 2015-10-31 MED ORDER — CLOPIDOGREL BISULFATE 75 MG PO TABS
75.0000 mg | ORAL_TABLET | Freq: Every day | ORAL | Status: DC
Start: 2015-10-31 — End: 2015-10-31

## 2015-10-31 MED ORDER — HYDROCHLOROTHIAZIDE 12.5 MG PO TABS: 12.5000 mg | ORAL_TABLET | Freq: Every day | ORAL | Status: DC

## 2015-10-31 MED ORDER — METFORMIN HCL 500 MG PO TABS: 500.0000 mg | ORAL_TABLET | Freq: Every day | ORAL | Status: DC

## 2015-10-31 MED ORDER — LISINOPRIL 20 MG PO TABS: 20.0000 mg | ORAL_TABLET | Freq: Every day | ORAL | Status: DC

## 2015-10-31 MED ORDER — NAPROXEN 500 MG PO TABS: 500.0000 mg | ORAL_TABLET | Freq: Two times a day (BID) | ORAL | Status: DC

## 2015-10-31 MED ORDER — GABAPENTIN 300 MG PO CAPS: 300.0000 mg | ORAL_CAPSULE | Freq: Three times a day (TID) | ORAL | Status: DC

## 2015-10-31 MED ORDER — FAMOTIDINE 40 MG PO TABS: 40.0000 mg | ORAL_TABLET | Freq: Every day | ORAL | Status: DC

## 2015-10-31 MED ORDER — ROSUVASTATIN CALCIUM 10 MG PO TABS: 10.0000 mg | ORAL_TABLET | Freq: Every day | ORAL | Status: DC

## 2015-10-31 MED ORDER — CLOPIDOGREL BISULFATE 75 MG PO TABS: 75.0000 mg | ORAL_TABLET | Freq: Every day | ORAL | Status: DC

## 2015-10-31 MED ORDER — METOPROLOL SUCCINATE ER 50 MG PO TB24: 50.0000 mg | ORAL_TABLET | Freq: Every day | ORAL | Status: DC

## 2015-10-31 MED ORDER — AMLODIPINE BESYLATE 5 MG PO TABS: 5.0000 mg | ORAL_TABLET | Freq: Every day | ORAL | Status: DC

## 2015-10-31 NOTE — Telephone Encounter (Signed)
Medications refilled

## 2015-10-31 NOTE — Telephone Encounter (Signed)
Patient called and states that refills were never received by [facility].  She needs all of her medication resent to their pharmacy. Zavon Hyson,CMA

## 2015-10-31 NOTE — Addendum Note (Signed)
Addended by: Valerie Roys on: 10/31/2015 03:11 PM   Modules accepted: Orders

## 2015-10-31 NOTE — Telephone Encounter (Signed)
Resent plavix due to it saying "no print". Tim Wilhide,CMA

## 2016-01-09 ENCOUNTER — Other Ambulatory Visit: Payer: Self-pay | Admitting: Internal Medicine

## 2016-01-19 ENCOUNTER — Other Ambulatory Visit: Payer: Self-pay | Admitting: Internal Medicine

## 2016-01-19 ENCOUNTER — Telehealth: Payer: Self-pay | Admitting: Internal Medicine

## 2016-01-19 MED ORDER — MELOXICAM 7.5 MG PO TABS: 7.5000 mg | ORAL_TABLET | Freq: Every day | ORAL | 2 refills | Status: DC

## 2016-01-19 NOTE — Telephone Encounter (Signed)
Refill given. I spoke with Wendy Heath and let her know she can pick this up at her pharmacy.

## 2016-01-19 NOTE — Telephone Encounter (Signed)
Refill request for meloxicam 7.5 mg. Pt states she takes this for arthritis but it is not on med list. Please advise.

## 2016-02-27 ENCOUNTER — Other Ambulatory Visit: Payer: Self-pay | Admitting: Internal Medicine

## 2016-03-21 ENCOUNTER — Emergency Department (HOSPITAL_COMMUNITY)
Admission: EM | Admit: 2016-03-21 | Discharge: 2016-03-21 | Disposition: A | Discharge status: Home / Self Care | Payer: Commercial Managed Care - HMO | Attending: Emergency Medicine | Admitting: Emergency Medicine

## 2016-03-21 ENCOUNTER — Emergency Department (HOSPITAL_COMMUNITY): Payer: Commercial Managed Care - HMO

## 2016-03-21 ENCOUNTER — Encounter (HOSPITAL_COMMUNITY): Payer: Self-pay | Admitting: Emergency Medicine

## 2016-03-21 DIAGNOSIS — Z853 Personal history of malignant neoplasm of breast: Secondary | ICD-10-CM | POA: Insufficient documentation

## 2016-03-21 DIAGNOSIS — Z7982 Long term (current) use of aspirin: Secondary | ICD-10-CM | POA: Insufficient documentation

## 2016-03-21 DIAGNOSIS — F17210 Nicotine dependence, cigarettes, uncomplicated: Secondary | ICD-10-CM | POA: Insufficient documentation

## 2016-03-21 DIAGNOSIS — R197 Diarrhea, unspecified: Secondary | ICD-10-CM | POA: Diagnosis not present

## 2016-03-21 DIAGNOSIS — K5732 Diverticulitis of large intestine without perforation or abscess without bleeding: Secondary | ICD-10-CM | POA: Insufficient documentation

## 2016-03-21 DIAGNOSIS — R1032 Left lower quadrant pain: Secondary | ICD-10-CM | POA: Diagnosis present

## 2016-03-21 DIAGNOSIS — Z8673 Personal history of transient ischemic attack (TIA), and cerebral infarction without residual deficits: Secondary | ICD-10-CM | POA: Diagnosis not present

## 2016-03-21 DIAGNOSIS — I10 Essential (primary) hypertension: Secondary | ICD-10-CM | POA: Diagnosis not present

## 2016-03-21 LAB — CBC
HCT: 41 % (ref 36.0–46.0)
Hemoglobin: 13.4 g/dL (ref 12.0–15.0)
MCH: 31.1 pg (ref 26.0–34.0)
MCHC: 32.7 g/dL (ref 30.0–36.0)
MCV: 95.1 fL (ref 78.0–100.0)
Platelets: 207 10*3/uL (ref 150–400)
RBC: 4.31 MIL/uL (ref 3.87–5.11)
RDW: 15 % (ref 11.5–15.5)
WBC: 11.5 10*3/uL — ABNORMAL HIGH (ref 4.0–10.5)

## 2016-03-21 LAB — URINALYSIS, ROUTINE W REFLEX MICROSCOPIC
Glucose, UA: NEGATIVE mg/dL
Hgb urine dipstick: NEGATIVE
Ketones, ur: NEGATIVE mg/dL
Nitrite: NEGATIVE
Protein, ur: NEGATIVE mg/dL
Specific Gravity, Urine: 1.024 (ref 1.005–1.030)
pH: 6 (ref 5.0–8.0)

## 2016-03-21 LAB — COMPREHENSIVE METABOLIC PANEL
ALT: 12 U/L — ABNORMAL LOW (ref 14–54)
AST: 17 U/L (ref 15–41)
Albumin: 4.1 g/dL (ref 3.5–5.0)
Alkaline Phosphatase: 68 U/L (ref 38–126)
Anion gap: 7 (ref 5–15)
BUN: 12 mg/dL (ref 6–20)
CO2: 30 mmol/L (ref 22–32)
Calcium: 9.7 mg/dL (ref 8.9–10.3)
Chloride: 101 mmol/L (ref 101–111)
Creatinine, Ser: 0.79 mg/dL (ref 0.44–1.00)
GFR calc Af Amer: >60 mL/min (ref >60)
GFR calc non Af Amer: >60 mL/min (ref >60)
Glucose, Bld: 92 mg/dL (ref 65–99)
Potassium: 3.4 mmol/L — ABNORMAL LOW (ref 3.5–5.1)
Sodium: 138 mmol/L (ref 135–145)
Total Bilirubin: 0.7 mg/dL (ref 0.3–1.2)
Total Protein: 7.4 g/dL (ref 6.5–8.1)

## 2016-03-21 LAB — URINE MICROSCOPIC-ADD ON: RBC / HPF: NONE SEEN RBC/hpf (ref 0–5)

## 2016-03-21 LAB — LIPASE, BLOOD: Lipase: 17 U/L (ref 11–51)

## 2016-03-21 MED ORDER — METRONIDAZOLE 500 MG PO TABS
500.0000 mg | ORAL_TABLET | Freq: Once | ORAL | Status: AC
  Administered 2016-03-21: 500 mg via ORAL
  Filled 2016-03-21: qty 1

## 2016-03-21 MED ORDER — MORPHINE SULFATE (PF) 4 MG/ML IV SOLN
4.0000 mg | Freq: Once | INTRAVENOUS | Status: AC
  Administered 2016-03-21: 4 mg via INTRAVENOUS
  Filled 2016-03-21: qty 1

## 2016-03-21 MED ORDER — ONDANSETRON 4 MG PO TBDP: 4.0000 mg | ORAL_TABLET | Freq: Three times a day (TID) | ORAL | 0 refills | Status: DC | PRN

## 2016-03-21 MED ORDER — OXYCODONE-ACETAMINOPHEN 5-325 MG PO TABS
1.0000 | ORAL_TABLET | ORAL | Status: DC | PRN
  Administered 2016-03-21: 1 via ORAL
  Filled 2016-03-21: qty 1

## 2016-03-21 MED ORDER — POLYETHYLENE GLYCOL 3350 17 G PO PACK: 17.0000 g | PACK | Freq: Every day | ORAL | 0 refills | Status: DC

## 2016-03-21 MED ORDER — ONDANSETRON 4 MG PO TBDP
4.0000 mg | ORAL_TABLET | Freq: Once | ORAL | Status: AC | PRN
  Administered 2016-03-21: 4 mg via ORAL
  Filled 2016-03-21: qty 1

## 2016-03-21 MED ORDER — ONDANSETRON HCL 4 MG/2ML IJ SOLN
4.0000 mg | Freq: Once | INTRAMUSCULAR | Status: AC
  Administered 2016-03-21: 4 mg via INTRAVENOUS
  Filled 2016-03-21: qty 2

## 2016-03-21 MED ORDER — SODIUM CHLORIDE 0.9 % IV BOLUS (SEPSIS)
1000.0000 mL | Freq: Once | INTRAVENOUS | Status: AC
  Administered 2016-03-21: 1000 mL via INTRAVENOUS

## 2016-03-21 MED ORDER — CIPROFLOXACIN IN D5W 400 MG/200ML IV SOLN
400.0000 mg | Freq: Once | INTRAVENOUS | Status: AC
  Administered 2016-03-21: 400 mg via INTRAVENOUS
  Filled 2016-03-21: qty 200

## 2016-03-21 MED ORDER — METRONIDAZOLE 500 MG PO TABS: 500.0000 mg | ORAL_TABLET | Freq: Three times a day (TID) | ORAL | 0 refills | Status: DC

## 2016-03-21 MED ORDER — OXYCODONE-ACETAMINOPHEN 5-325 MG PO TABS: 1.0000 | ORAL_TABLET | Freq: Four times a day (QID) | ORAL | 0 refills | Status: DC | PRN

## 2016-03-21 MED ORDER — CIPROFLOXACIN HCL 500 MG PO TABS: 500.0000 mg | ORAL_TABLET | Freq: Two times a day (BID) | ORAL | 0 refills | Status: DC

## 2016-03-21 NOTE — ED Notes (Signed)
She is happy to be eating dinner, authorized by Dr. Alvino Chapel.

## 2016-03-21 NOTE — ED Triage Notes (Signed)
Patient reports lower abdominal pain and lower back pain since this morning. Patient reports N/V/D, denies fevers. Hx diverticulitis.
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
## 2016-03-21 NOTE — ED Notes (Signed)
Pt medicated for pain in lobby.  Advised of precautions to not drive with medication.

## 2016-03-21 NOTE — Discharge Instructions (Signed)
Watch for increasing pain fevers or unable to maintain oral intake. Follow-up with her primary care doctor.

## 2016-03-23 ENCOUNTER — Telehealth: Payer: Self-pay | Admitting: Internal Medicine

## 2016-03-23 NOTE — Telephone Encounter (Signed)
[facility] called and needs an updated [employer] referral for the patient.  Dr. Burr Medico  [phone]  jw

## 2016-03-25 NOTE — Progress Notes (Signed)
[facility]  [phone] [phone]  Clinic Follow up Note   Patient Care Team: Sela Hua, MD as PCP - General (Family Medicine) 03/26/2016   DIAGNOSIS: history of left triple negative breast cancer diagnosed 2008  PRIOR THERAPY:  #1 patient originally presented with locally advanced triple-negative left breast cancer. She went on to receive neoadjuvant chemotherapy followed by lumpectomy and sentinel lymph node biopsy performed on 11/25/2006. She then received radiation therapy and has been on followup.  CURRENT THERAPY: Surveillance   INTERVAL HISTORY: [name] is a 65 yo African-American female with past medical history of breast cancer in 2008, is here for her annual follow-up. She was last seen by me 1 year ago. She is currently getting well, denies any new symptoms. She has chronic left knee pain, had steroids injection before. She otherwise denies any other new pain, dyspnea, or, no discomfort. She does self breast exam. She does not exercise much due to the knee pain. She was encouraged by emergency room 3 days ago due to abdominal pain secondary to diverticulosis, resolved now.  REVIEW OF SYSTEMS:   Constitutional: Denies fevers, chills or abnormal weight loss Eyes: Denies blurriness of vision Ears, nose, mouth, throat, and face: Denies mucositis or sore throat Respiratory: Denies cough, dyspnea or wheezes Cardiovascular: Denies palpitation, chest discomfort or lower extremity swelling Gastrointestinal:  Denies nausea, heartburn or change in bowel habits Skin: Denies abnormal skin rashes Lymphatics: Denies new lymphadenopathy or easy bruising Neurological:Denies numbness, tingling or new weaknesses Behavioral/Psych: Mood is stable, no new changes  All other systems were reviewed with the patient and are negative.  MEDICAL HISTORY:  Past Medical History:  Diagnosis Date  . Blind right eye 07/04/2011   Since stroke 2003  .  CEREBROVASCULAR ACCIDENT, HX OF 08/24/2006  . Diverticulitis   . GERD 08/24/2006  . HYPERLIPIDEMIA 08/24/2006  . HYPERTENSION 08/24/2006  . Impaired glucose tolerance 07/04/2011  . Malignant neoplasm of breast (female), unspecified site 03/13/2011  . Stroke [facility]) 2005    SURGICAL HISTORY: Past Surgical History:  Procedure Laterality Date  . ABDOMINAL HYSTERECTOMY  1995  . COLONOSCOPY N/A 02/05/2013   Procedure: COLONOSCOPY;  Surgeon: Beryle Beams, MD;  Location: [facility];  Service: Endoscopy;  Laterality: N/A;    I have reviewed the social history and family history with the patient and they are unchanged from previous note.  ALLERGIES:  has No Known Allergies.  MEDICATIONS:  Current Outpatient Prescriptions  Medication Sig Dispense Refill  . amLODipine (NORVASC) 5 MG tablet TAKE 1 TABLET EVERY DAY 90 tablet 2  . aspirin 81 MG tablet Take 81 mg by mouth daily.     . cholecalciferol (VITAMIN D) 400 UNITS TABS Take 400 Units by mouth daily.     . clopidogrel (PLAVIX) 75 MG tablet TAKE 1 TABLET DAILY (DO NOT TAKE FOR 1 MORE WEEK, THEN RESUME) 90 tablet 2  . diclofenac sodium (VOLTAREN) 1 % GEL Apply 2 g topically 4 (four) times daily. 100 g 0  . famotidine (PEPCID) 40 MG tablet Take 1 tablet (40 mg total) by mouth daily. 90 tablet 3  . gabapentin (NEURONTIN) 300 MG capsule Take 1 capsule (300 mg total) by mouth 3 (three) times daily. (Patient taking differently: Take 300 mg by mouth daily. ) 90 capsule 5  . hydrochlorothiazide (HYDRODIURIL) 12.5 MG tablet Take 1 tablet (12.5 mg total) by mouth daily. 90 tablet 3  . lisinopril (PRINIVIL,ZESTRIL) 20 MG tablet Take 1 tablet (20  mg total) by mouth daily. 90 tablet 3  . meloxicam (MOBIC) 7.5 MG tablet Take 1 tablet (7.5 mg total) by mouth daily. 30 tablet 2  . metoprolol succinate (TOPROL-XL) 50 MG 24 hr tablet Take 1 tablet (50 mg total) by mouth daily. 90 tablet 3  . metroNIDAZOLE (FLAGYL) 500 MG tablet Take 1 tablet (500 mg total) by  mouth 3 (three) times daily. 30 tablet 0  . naproxen (NAPROSYN) 500 MG tablet Take 1 tablet (500 mg total) by mouth 2 (two) times daily with a meal. (Patient taking differently: Take 500 mg by mouth daily. ) 30 tablet 0  . polyethylene glycol (MIRALAX / GLYCOLAX) packet Take 17 g by mouth daily. While on the pain medicine 14 each 0  . rosuvastatin (CRESTOR) 10 MG tablet Take 1 tablet (10 mg total) by mouth daily. 90 tablet 3  . ondansetron (ZOFRAN-ODT) 4 MG disintegrating tablet Take 1 tablet (4 mg total) by mouth every 8 (eight) hours as needed for nausea or vomiting. (Patient not taking: Reported on 03/26/2016) 6 tablet 0   No current facility-administered medications for this visit.     PHYSICAL EXAMINATION: ECOG PERFORMANCE STATUS: 1 - Symptomatic but completely ambulatory  Vitals:   03/26/16 1323  BP: 133/76  Pulse: 73  Resp: 18  Temp: 98 F (36.7 C)   Filed Weights   03/26/16 1323  Weight: 175 lb 4.8 oz (79.5 kg)    GENERAL:alert, no distress and comfortable SKIN: skin color, texture, turgor are normal, no rashes or significant lesions EYES: normal, Conjunctiva are pink and non-injected, sclera clear OROPHARYNX:no exudate, no erythema and lips, buccal mucosa, and tongue normal  NECK: supple, thyroid normal size, non-tender, without nodularity LYMPH:  no palpable lymphadenopathy in the cervical, axillary or inguinal LUNGS: clear to auscultation and percussion with normal breathing effort HEART: regular rate & rhythm and no murmurs and no lower extremity edema ABDOMEN:abdomen soft, non-tender and normal bowel sounds Musculoskeletal:no cyanosis of digits and no clubbing  NEURO: alert & oriented x 3 with fluent speech, no focal motor/sensory deficits Breasts: Breast inspection showed them to be symmetrical with no nipple discharge. Palpation of the breasts and axilla revealed no obvious mass that I could appreciate.  LABORATORY DATA:  I have reviewed the data as listed CBC  Latest Ref Rng & Units 03/26/2016 03/21/2016 09/04/2015  WBC 3.9 - 10.3 10e3/uL 6.1 11.5(H) 11.5(H)  Hemoglobin 11.6 - 15.9 g/dL 13.8 13.4 14.2  Hematocrit 34.8 - 46.6 % 42.6 41.0 43.1  Platelets 145 - 400 10e3/uL 202 207 214     CMP Latest Ref Rng & Units 03/21/2016 09/04/2015 09/02/2015  Glucose 65 - 99 mg/dL 92 96 128(H)  BUN 6 - 20 mg/dL 12 15 21   Creatinine 0.44 - 1.00 mg/dL 0.79 0.72 0.85  Sodium 135 - 145 mmol/L 138 142 144  Potassium 3.5 - 5.1 mmol/L 3.4(L) 3.2(L) 3.4(L)  Chloride 101 - 111 mmol/L 101 105 103  CO2 22 - 32 mmol/L 30 27 29   Calcium 8.9 - 10.3 mg/dL 9.7 9.7 9.8  Total Protein 6.5 - 8.1 g/dL 7.4 8.2(H) -  Total Bilirubin 0.3 - 1.2 mg/dL 0.7 0.9 -  Alkaline Phos 38 - 126 U/L 68 69 -  AST 15 - 41 U/L 17 19 -  ALT 14 - 54 U/L 12(L) 13(L) -   Pathology report Patient Name: VERBLE, PEEKS. Office Chart Number: N/A  MRN: [id number] Pathologist: Neldon Mc, MD DOB/Age Aug 25, 1950 (Age: 72) Gender: F Date Taken:  11/25/2006 Date Received: 11/25/2006  FINAL DIAGNOSIS  MICROSCOPIC EXAMINATION AND DIAGNOSIS  1. LYMPH NODE, LEFT AXILLARY SENTINEL, BIOPSY: - ONE LYMPH NODE NEGATIVE FOR TUMOR. - FINDINGS CONSISTENT WITH PREVIOUS BIOPSY SITE REACTION. - BENIGN LYMPHOID HYPERPLASIA.  2. BREAST, LEFT, WIRE/NEEDLE LOCALIZED PARTIAL MASTECTOMY: - RESIDUAL INVASIVE DUCTAL CARCINOMA, 1.3 CM, MSBR II OF III. - MARGINS NEGATIVE WITH THE NEAREST MARGIN THE SUPERIOR MARGIN AT 0.4 CM. - RARE CALCIFICATIONS ASSOCIATED WITH SCLEROTIC LOBULES. - SKIN UNREMARKABLE.    RADIOGRAPHIC STUDIES: I have personally reviewed the radiological images as listed and agreed with the findings in the report.   MAMMOGRAM 09/12/2015 IMPRESSION: No mammographic evidence of malignancy. A result letter of this screening mammogram will be mailed directly to the patient.  RECOMMENDATION: Screening mammogram in one year. (Code:SM-B-01Y)   ASSESSMENT & PLAN:  65 year old  African-American female, postmenopausal,   1. History of left breast invasive ductal carcinoma, [id number], stage I, triple negative -It has been 9 years since her initial diagnosis and treatment. We discussed that the risk of cancer recurrence is minimal now. Triple negative breast cancer very rarely recur after 5 years. -She is clinically doing very well, exam and last mammogram showed no evidence of disease recurrence -I reviewed her lab results, CBC and CMP are within normal limits -I do not think she needs routine follow-up with [place]. -I encouraged her to continue annual screening mammogram, self-exam and do a breast exam by her primary care physician once a year -We discussed having a diet and exercise.   2. Bone health  -Her bone density scan in 2008 was normal -I encouraged her to repeat a bone density scan. -I encouraged her to take calcium and vitamin D  3. HTN, arthritis -She'll continue follow-up with her primary care physician  Follow-up, I'll only see her as needed in the future. She will continue annual screening mammogram and breast exam by her primary care physician   Orders Placed This Encounter  Procedures  . CBC with Differential    Standing Status:   Standing    Number of Occurrences:   20    Standing Expiration Date:   03/25/2026  . Comprehensive metabolic panel    Standing Status:   Standing    Number of Occurrences:   20    Standing Expiration Date:   03/25/2026   All questions were answered. The patient knows to call the clinic with any problems, questions or concerns. No barriers to learning was detected.  I spent 15 minutes counseling the patient face to face. The total time spent in the appointment was 20 minutes and more than 50% was on counseling and review of test results     Truitt Merle, MD 03/26/16 1:37 PM

## 2016-03-26 ENCOUNTER — Other Ambulatory Visit (HOSPITAL_BASED_OUTPATIENT_CLINIC_OR_DEPARTMENT_OTHER): Payer: Commercial Managed Care - HMO

## 2016-03-26 ENCOUNTER — Encounter: Payer: Self-pay | Admitting: Hematology

## 2016-03-26 ENCOUNTER — Ambulatory Visit (HOSPITAL_BASED_OUTPATIENT_CLINIC_OR_DEPARTMENT_OTHER): Payer: Commercial Managed Care - HMO | Admitting: Hematology

## 2016-03-26 VITALS — BP 133/76 | HR 73 | Temp 98.0°F | Resp 18 | Ht 63.0 in | Wt 175.3 lb

## 2016-03-26 DIAGNOSIS — Z853 Personal history of malignant neoplasm of breast: Secondary | ICD-10-CM

## 2016-03-26 DIAGNOSIS — M1990 Unspecified osteoarthritis, unspecified site: Secondary | ICD-10-CM | POA: Diagnosis not present

## 2016-03-26 DIAGNOSIS — I10 Essential (primary) hypertension: Secondary | ICD-10-CM | POA: Diagnosis not present

## 2016-03-26 LAB — CBC WITH DIFFERENTIAL/PLATELET
BASO%: 1.1 % (ref 0.0–2.0)
Basophils Absolute: 0.1 10*3/uL (ref 0.0–0.1)
EOS%: 3 % (ref 0.0–7.0)
Eosinophils Absolute: 0.2 10*3/uL (ref 0.0–0.5)
HCT: 42.6 % (ref 34.8–46.6)
HGB: 13.8 g/dL (ref 11.6–15.9)
LYMPH%: 30.2 % (ref 14.0–49.7)
MCH: 30.6 pg (ref 25.1–34.0)
MCHC: 32.4 g/dL (ref 31.5–36.0)
MCV: 94.2 fL (ref 79.5–101.0)
MONO#: 0.6 10*3/uL (ref 0.1–0.9)
MONO%: 10.7 % (ref 0.0–14.0)
NEUT#: 3.4 10*3/uL (ref 1.5–6.5)
NEUT%: 55 % (ref 38.4–76.8)
Platelets: 202 10*3/uL (ref 145–400)
RBC: 4.52 10*6/uL (ref 3.70–5.45)
RDW: 14.9 % — ABNORMAL HIGH (ref 11.2–14.5)
WBC: 6.1 10*3/uL (ref 3.9–10.3)
lymph#: 1.8 10*3/uL (ref 0.9–3.3)

## 2016-03-26 LAB — COMPREHENSIVE METABOLIC PANEL
ALT: 10 U/L (ref 0–55)
AST: 16 U/L (ref 5–34)
Albumin: 3.5 g/dL (ref 3.5–5.0)
Alkaline Phosphatase: 86 U/L (ref 40–150)
Anion Gap: 11 mEq/L (ref 3–11)
BUN: 13.5 mg/dL (ref 7.0–26.0)
CO2: 30 mEq/L — ABNORMAL HIGH (ref 22–29)
Calcium: 10.3 mg/dL (ref 8.4–10.4)
Chloride: 99 mEq/L (ref 98–109)
Creatinine: 1.2 mg/dL — ABNORMAL HIGH (ref 0.6–1.1)
EGFR: 54 mL/min/{1.73_m2} — ABNORMAL LOW (ref >90)
Glucose: 104 mg/dl (ref 70–140)
Potassium: 3.5 mEq/L (ref 3.5–5.1)
Sodium: 141 mEq/L (ref 136–145)
Total Bilirubin: 0.43 mg/dL (ref 0.20–1.20)
Total Protein: 7.3 g/dL (ref 6.4–8.3)

## 2016-03-26 NOTE — Telephone Encounter (Signed)
Completed.

## 2016-05-14 ENCOUNTER — Other Ambulatory Visit: Payer: Self-pay | Admitting: Internal Medicine

## 2016-05-18 ENCOUNTER — Encounter: Payer: Self-pay | Admitting: Internal Medicine

## 2016-05-18 ENCOUNTER — Ambulatory Visit (INDEPENDENT_AMBULATORY_CARE_PROVIDER_SITE_OTHER): Payer: Medicare HMO | Admitting: Internal Medicine

## 2016-05-18 VITALS — BP 132/80 | HR 63 | Temp 98.3°F | Wt 180.0 lb

## 2016-05-18 DIAGNOSIS — M79602 Pain in left arm: Secondary | ICD-10-CM

## 2016-05-18 DIAGNOSIS — E538 Deficiency of other specified B group vitamins: Secondary | ICD-10-CM | POA: Diagnosis not present

## 2016-05-18 DIAGNOSIS — M79601 Pain in right arm: Secondary | ICD-10-CM | POA: Diagnosis not present

## 2016-05-18 DIAGNOSIS — E119 Type 2 diabetes mellitus without complications: Secondary | ICD-10-CM

## 2016-05-18 DIAGNOSIS — I10 Essential (primary) hypertension: Secondary | ICD-10-CM | POA: Diagnosis not present

## 2016-05-18 DIAGNOSIS — Z0000 Encounter for general adult medical examination without abnormal findings: Secondary | ICD-10-CM

## 2016-05-18 DIAGNOSIS — Z23 Encounter for immunization: Secondary | ICD-10-CM | POA: Diagnosis not present

## 2016-05-18 DIAGNOSIS — Z79899 Other long term (current) drug therapy: Secondary | ICD-10-CM | POA: Diagnosis not present

## 2016-05-18 LAB — TSH: TSH: 0.74 mIU/L

## 2016-05-18 LAB — VITAMIN B12: Vitamin B-12: 544 pg/mL (ref 200–1100)

## 2016-05-18 LAB — FOLATE: Folate: 15.9 ng/mL (ref >5.4)

## 2016-05-18 LAB — POCT GLYCOSYLATED HEMOGLOBIN (HGB A1C): Hemoglobin A1C: 5.4

## 2016-05-18 NOTE — Patient Instructions (Addendum)
It was so nice to see you!  You are doing a great job managing your diabetes and high blood pressure.  You can increase your Gabapentin to 1 tablet in the morning, 1 tablet during the day, and 2 tablets at night. You can even go up to 2 tablets three times a day.  We will check some labs today to make sure there is no underlying cause for the nerve pain. I will call you with these results.  I have referred you to the eye doctor. You should hear from our office in 2 weeks to schedule this appointment.  We will see you back in 6 months!  -Dr. Brett Albino

## 2016-05-18 NOTE — Progress Notes (Signed)
   [facility] Phone: [phone]  Subjective:  Wendy Heath is a 66 year old female presenting to clinic for follow-up of HTN, diabetes, and bilateral forearm pain.  HTN: Doing well. Doesn't check her blood pressure at home. Taking Lisinopril, HCTZ, and Norvasc. No side effects. No chest pain, no SOB, no lower extremity edema.  T2DM: Doing well. Doesn't check her blood sugars at home. No shakiness or sweatiness. Controlled by diet and exercise.  Bilateral Forearm Pain: Has been having L dorsal forearm pain for months. Was previously diagnosed with L lateral epicondylitis. Developed R dorsal forearm pain this past week. Describes the pain as "burning". Located throughout the whole dorsal aspect of both forearms. Has previously been treated with Voltaren gel for the L lateral epicondylitis and Gabapentin for presumed nerve pain. She feels like the Gabapentin is helping a little. The pain is worse with cold weather. No redness, warmth, or edema. No weakness in her hands. No numbness, no tingling.  ROS: See HPI for pertinent positives and negatives  Past Medical History- HTN, GERD, HLD, obesity, hx stroke, hx breast cancer now in remission.  Family history reviewed for today's visit. No changes.  Social history- patient is a current smoker  Objective: BP 132/80   Pulse 63   Temp 98.3 F (36.8 C) (Oral)   Wt 180 lb (81.6 kg)   SpO2 99%   BMI 31.89 kg/m  Gen: NAD, alert, cooperative with exam HEENT: NCAT, EOMI, MMM CV: RRR, no murmur Resp: CTABL, no wheezes, normal work of breathing Msk: No edema, erythema, or warmth of the bilateral forearms; no gross deformity; very mild tenderness to palpation over the L lateral epicondyle, no tenderness over the R lateral epicondyle. Neuro: Alert and oriented, 5/5 strength in upper extremities bilaterally, 5/5 grip strength, sensation intact to light touch bilaterally. Skin: No rashes, no  lesions  Assessment/Plan: HTN: Well-controlled. BP 132/90 today. - Continue Norvasc 5mg  daily, HCTZ 12.5mg  daily, Metoprolol succinate 50mg  daily, and Lisinopril 20mg  daily. - Pt has recent BMET from 03/26/16 showing normal K and mildly elevated Cr  - Recheck BMET at next visit. - Follow-up in 6 months  T2DM: Well controlled. A1c 5.4% today. - Referral placed to Ophthalmology for annual exam  Bilateral Forearm Pain: Seems consistent with nerve pain, as Pt describes the pain as "burning". Pain is located on the dorsal forearms, extending just from the elbows to the hands. She may have some type of radial nerve entrapment at the level of the elbows. No weakness or decreased sensation. I do not think this is coming from the cervical spine. - Increase Gabapentin from 300mg  tid to 300mg  in the AM, 300mg  in the afternoon, and 600mg  in the evening. Discussed that she can go up to 600mg  tid if she can tolerate it. - Continue Meloxicam 7.5mg  daily. May need to stop this if her Cr continues to be elevated. - Will check Folate, B12, and TSH to rule out other causes of neuropathy - Follow-up if not improving  Health Care Maintenance: - Pt received flu shot and pneumococcal vaccine today.   Hyman Bible, MD PGY-2

## 2016-05-20 DIAGNOSIS — E119 Type 2 diabetes mellitus without complications: Secondary | ICD-10-CM

## 2016-05-20 HISTORY — DX: Type 2 diabetes mellitus without complications: E11.9

## 2016-05-20 NOTE — Assessment & Plan Note (Signed)
-   Pt received flu shot and pneumococcal vaccine today.

## 2016-05-20 NOTE — Assessment & Plan Note (Signed)
Well controlled. A1c 5.4% today. - Referral placed to Ophthalmology for annual exam

## 2016-05-20 NOTE — Assessment & Plan Note (Signed)
Well-controlled. BP 132/90 today. - Continue Norvasc 5mg  daily, HCTZ 12.5mg  daily, Metoprolol succinate 50mg  daily, and Lisinopril 20mg  daily. - Pt has recent BMET from 03/26/16 showing normal K and mildly elevated Cr  - Recheck BMET at next visit. - Follow-up in 6 months

## 2016-05-20 NOTE — Assessment & Plan Note (Signed)
Seems consistent with nerve pain, as Pt describes the pain as "burning". Pain is located on the dorsal forearms, extending just from the elbows to the hands. She may have some type of radial nerve entrapment at the level of the elbows. No weakness or decreased sensation. I do not think this is coming from the cervical spine. - Increase Gabapentin from 300mg  tid to 300mg  in the AM, 300mg  in the afternoon, and 600mg  in the evening. Discussed that she can go up to 600mg  tid if she can tolerate it. - Continue Meloxicam 7.5mg  daily. May need to stop this if her Cr continues to be elevated. - Will check Folate, B12, and TSH to rule out other causes of neuropathy - Follow-up if not improving

## 2016-06-25 ENCOUNTER — Other Ambulatory Visit: Payer: Self-pay | Admitting: Internal Medicine

## 2016-06-25 DIAGNOSIS — Z1231 Encounter for screening mammogram for malignant neoplasm of breast: Secondary | ICD-10-CM

## 2016-07-18 ENCOUNTER — Other Ambulatory Visit: Payer: Self-pay | Admitting: Internal Medicine

## 2016-07-18 DIAGNOSIS — M79602 Pain in left arm: Secondary | ICD-10-CM

## 2016-07-18 MED ORDER — GABAPENTIN 300 MG PO CAPS: 300.0000 mg | ORAL_CAPSULE | Freq: Three times a day (TID) | ORAL | 5 refills | Status: DC

## 2016-07-18 NOTE — Telephone Encounter (Signed)
Pt calling to request refill of:  Name of Medication(s):  Gabapentin  Last date of OV:  05-18-16 Pharmacy:  [facility]  Will route refill request to Clinic RN.  Discussed with patient policy to call pharmacy for future refills.  Also, discussed refills may take up to 48 hours to approve or deny.  Renella Cunas

## 2016-09-13 ENCOUNTER — Ambulatory Visit: Payer: Self-pay

## 2016-09-20 DIAGNOSIS — I10 Essential (primary) hypertension: Secondary | ICD-10-CM | POA: Diagnosis not present

## 2016-09-20 DIAGNOSIS — H524 Presbyopia: Secondary | ICD-10-CM | POA: Diagnosis not present

## 2016-09-20 DIAGNOSIS — H4720 Unspecified optic atrophy: Secondary | ICD-10-CM | POA: Diagnosis not present

## 2016-09-26 ENCOUNTER — Ambulatory Visit
Admission: RE | Admit: 2016-09-26 | Discharge: 2016-09-26 | Disposition: A | Discharge status: Home / Self Care | Payer: Medicare HMO | Source: Ambulatory Visit | Attending: Family Medicine | Admitting: Family Medicine

## 2016-09-26 DIAGNOSIS — Z1231 Encounter for screening mammogram for malignant neoplasm of breast: Secondary | ICD-10-CM | POA: Diagnosis not present

## 2016-09-26 HISTORY — DX: Personal history of irradiation: Z92.3

## 2016-10-09 ENCOUNTER — Other Ambulatory Visit: Payer: Self-pay | Admitting: Internal Medicine

## 2016-11-12 ENCOUNTER — Ambulatory Visit (INDEPENDENT_AMBULATORY_CARE_PROVIDER_SITE_OTHER): Payer: Medicare Other | Admitting: Internal Medicine

## 2016-11-12 ENCOUNTER — Encounter: Payer: Self-pay | Admitting: Internal Medicine

## 2016-11-12 DIAGNOSIS — K219 Gastro-esophageal reflux disease without esophagitis: Secondary | ICD-10-CM

## 2016-11-12 NOTE — Patient Instructions (Signed)
It was so wonderful to see you!  Please start taking the Pepcid at night. I would also recommend using Tums as needed. If you continue to have pain at night, please give me a call and we can try a different medication.  I would also recommend not using any Ibuprofen, Advil, Meloxicam, Naproxen, etc.  Foods that trigger heartburn: Alcohol, black pepper, garlic, raw onions, spicy foods, chocolate, lemons, oranges, orange juice, tea, soda, peppermint, tomatoes.  -Dr. Brett Albino

## 2016-11-12 NOTE — Progress Notes (Signed)
   [facility] Phone: [phone]  Subjective:  Wendy Heath a 66 year old female presenting to clinic with heartburn and indigestion for 1 week. The heartburn is worse at night. It feels like a "sharp pain". She feels heartburn in the center of her chest. She does not have any radiating pain. No associated shortness of breath, nausea, or diaphoresis. The heartburn gets better if she sits up and burps. Nothing makes the heartburn worse. She has not been eating any more sodas or spicy foods normal. She takes Pepcid 40 mg once daily in the morning. She denies any dysphasia. She is having regular bowel movements. No epigastric abdominal pain.  ROS: See HPI for pertinent positives and negatives  Past Medical History- hypertension, type 2 diabetes, GERD, hyperlipidemia, obesity, history of stroke, history of left breast cancer  Family history reviewed for today's visit. No changes.  Social history- patient is a current smoker  Objective: BP 120/70   Pulse 65   Temp 98.2 F (36.8 C) (Oral)   Ht 5\' 3"  (1.6 m)   Wt 181 lb (82.1 kg)   SpO2 99%   BMI 32.06 kg/m  Gen: NAD, alert, cooperative with exam CV: RRR, no murmur, no tenderness to palpation of the sternum. Resp: CTABL, no wheezes, normal work of breathing   Assessment/Plan: GERD: Uncontrolled. Having some indigestion that is waking her up at night.  - Start taking Pepcid at night instead of during the day - Use Tums prn - Stop Meloxicam - Given a list of foods to avoid that are known to trigger GERD - Patient will call if no improvement, can consider adding Protonix at that point   Hyman Bible, MD PGY-3

## 2016-11-12 NOTE — Assessment & Plan Note (Signed)
Uncontrolled. Having some indigestion that is waking her up at night.  - Start taking Pepcid at night instead of during the day - Use Tums prn - Stop Meloxicam - Given a list of foods to avoid that are known to trigger GERD - Patient will call if no improvement, can consider adding Protonix at that point

## 2016-11-28 ENCOUNTER — Other Ambulatory Visit: Payer: Self-pay | Admitting: Internal Medicine

## 2016-11-28 DIAGNOSIS — M79602 Pain in left arm: Secondary | ICD-10-CM

## 2016-11-28 MED ORDER — CLOPIDOGREL BISULFATE 75 MG PO TABS: ORAL_TABLET | ORAL | 2 refills | Status: DC

## 2016-11-28 MED ORDER — METOPROLOL SUCCINATE ER 50 MG PO TB24: 50.0000 mg | ORAL_TABLET | Freq: Every day | ORAL | 3 refills | Status: DC

## 2016-11-28 MED ORDER — GABAPENTIN 300 MG PO CAPS: 300.0000 mg | ORAL_CAPSULE | Freq: Three times a day (TID) | ORAL | 5 refills | Status: DC

## 2016-11-28 MED ORDER — FAMOTIDINE 40 MG PO TABS: 40.0000 mg | ORAL_TABLET | Freq: Every day | ORAL | 3 refills | Status: DC

## 2016-11-28 MED ORDER — DICLOFENAC SODIUM 1 % TD GEL: 2.0000 g | Freq: Four times a day (QID) | TRANSDERMAL | 0 refills | Status: DC

## 2016-11-28 MED ORDER — ROSUVASTATIN CALCIUM 10 MG PO TABS: 10.0000 mg | ORAL_TABLET | Freq: Every day | ORAL | 3 refills | Status: DC

## 2016-11-28 MED ORDER — HYDROCHLOROTHIAZIDE 12.5 MG PO TABS: 12.5000 mg | ORAL_TABLET | Freq: Every day | ORAL | 3 refills | Status: DC

## 2016-11-28 MED ORDER — LISINOPRIL 20 MG PO TABS: 20.0000 mg | ORAL_TABLET | Freq: Every day | ORAL | 3 refills | Status: DC

## 2016-11-28 NOTE — Telephone Encounter (Signed)
Pt calling to request refill of:  Name of Medication(s):  Plavix, voltaren gel, pepcid, gabapentin, hydrochlorothiazide, lisinopril, toprol xl, and crestor  Last date of OV: 11-12-16 Pharmacy:  [facility]   Will route refill request to Clinic RN.  Discussed with patient policy to call pharmacy for future refills.  Also, discussed refills may take up to 48 hours to approve or deny.  Renella Cunas

## 2016-12-07 ENCOUNTER — Telehealth: Payer: Self-pay | Admitting: *Deleted

## 2016-12-07 ENCOUNTER — Other Ambulatory Visit: Payer: Self-pay | Admitting: *Deleted

## 2016-12-07 MED ORDER — MELOXICAM 7.5 MG PO TABS: 7.5000 mg | ORAL_TABLET | Freq: Every day | ORAL | 0 refills | Status: DC

## 2016-12-07 NOTE — Telephone Encounter (Signed)
Patient requesting refill on meloxicam for joint pain. Hubbard Hartshorn, RN, BSN

## 2017-03-03 ENCOUNTER — Other Ambulatory Visit: Payer: Self-pay | Admitting: Internal Medicine

## 2017-03-20 ENCOUNTER — Ambulatory Visit: Payer: Self-pay | Admitting: Internal Medicine

## 2017-03-27 ENCOUNTER — Encounter: Payer: Self-pay | Admitting: Internal Medicine

## 2017-03-27 ENCOUNTER — Ambulatory Visit (INDEPENDENT_AMBULATORY_CARE_PROVIDER_SITE_OTHER): Payer: Medicare HMO | Admitting: Internal Medicine

## 2017-03-27 ENCOUNTER — Other Ambulatory Visit: Payer: Self-pay

## 2017-03-27 VITALS — BP 130/78 | HR 75 | Temp 97.6°F | Ht 63.0 in | Wt 195.0 lb

## 2017-03-27 DIAGNOSIS — I10 Essential (primary) hypertension: Secondary | ICD-10-CM

## 2017-03-27 DIAGNOSIS — R51 Headache: Secondary | ICD-10-CM

## 2017-03-27 DIAGNOSIS — E785 Hyperlipidemia, unspecified: Secondary | ICD-10-CM

## 2017-03-27 DIAGNOSIS — E119 Type 2 diabetes mellitus without complications: Secondary | ICD-10-CM

## 2017-03-27 DIAGNOSIS — R519 Headache, unspecified: Secondary | ICD-10-CM | POA: Insufficient documentation

## 2017-03-27 LAB — POCT GLYCOSYLATED HEMOGLOBIN (HGB A1C): Hemoglobin A1C: 5.9

## 2017-03-27 MED ORDER — METFORMIN HCL 500 MG PO TABS: 500.0000 mg | ORAL_TABLET | Freq: Every day | ORAL | 0 refills | Status: DC

## 2017-03-27 NOTE — Assessment & Plan Note (Signed)
Unclear etiology. Pain is not located over her temple and she has no tenderness in that area on exam, so temporal arteritis is unlikely. Completely normal neuro exam and no other symptoms such as numbness/tingling, slurred speech, balance issues to suggest stroke or other intracranial process. No rhinorrhea or tearing of the eye and doesn't last long enough to be consistent with a cluster headache. Also doesn't seem to be a migraine either. No headache red flags. - Will continue to monitor for now - If the headaches worsen or if she starts having any red flags, will likely need to obtain imaging - Return precautions discussed in detail - Follow-up if no improvement.

## 2017-03-27 NOTE — Progress Notes (Signed)
   [facility] Phone: [phone]  Subjective:  Wendy Heath is a 66 year old female presenting to clinic with headaches and to follow-up with her diabetes, blood pressure, and HLD.  Headaches: Have been going on for the last month. Headache is located in the right forehead. The headache lasts for 2 seconds at a time. The headache has only happened 2-3 times. The headache goes away on its own. The headache is "sharp". No changes in vision, no numbness, no tingling, no slurred speech, no balance issues. No rhinorrhea or tearing of the eye. Pain does not wake her up at night.  HTN: Taking HCTZ, Lisinopril, Norvasc, and Metoprolol succinate. No side effects to any of the medications. Doesn't check her blood pressures at home. No chest pain, no shortness of breath, no lower extremity edema.  T2DM: Used to take Metformin, but states that she ran out of it and never restarted. She denies any side effects to Metformin and would like to restart it at this time. Does not check her blood sugars at home. No polyuria, no polydipsia.  HLD: Taking Crestor 10mg  daily. No RUQ pain or muscle pain.  ROS: See HPI for pertinent positives and negatives  Past Medical History- HTN, T2DM, hx CVA, HLD, hx breast cancer, obesity  Family history reviewed for today's visit. No changes.  Social history- patient is a current smoker  Objective: BP 130/78   Pulse 75   Temp 97.6 F (36.4 C) (Oral)   Ht 5\' 3"  (1.6 m)   Wt 195 lb (88.5 kg)   SpO2 99%   BMI 34.54 kg/m  Gen: NAD, alert, cooperative with exam HEENT: NCAT, EOMI, MMM, no tenderness over right temple Neck: FROM, supple CV: RRR, no murmur Resp: CTABL, no wheezes, normal work of breathing Msk: No edema, warm, normal tone, moves UE/LE spontaneously Diabetic Foot: No deformities, no ulcerations, no other skin breakdown bilaterally; intact to touch and monofilament testing bilaterally; PT and DP pulses intact bilaterally Neuro:  Alert and oriented, CN 2-12 intact, 5/5 muscle strength in upper and lower extremities bilaterally; sensation intact to light touch in the upper and lower extremities bilaterally; reflexes blunted but symmetric; normal finger to nose testing; normal balance; normal gait. Skin: No rashes, no lesions Psych: Appropriate behavior  Assessment/Plan: Headache: Unclear etiology. Pain is not located over her temple and she has no tenderness in that area on exam, so temporal arteritis is unlikely. Completely normal neuro exam and no other symptoms such as numbness/tingling, slurred speech, balance issues to suggest stroke or other intracranial process. No rhinorrhea or tearing of the eye and doesn't last long enough to be consistent with a cluster headache. Also doesn't seem to be a migraine either. No headache red flags. - Will continue to monitor for now - If the headaches worsen or if she starts having any red flags, will likely need to obtain imaging - Return precautions discussed in detail - Follow-up if no improvement.  HTN: Well-controlled. BP 130/78 in clinic today. Goal <140/<90.  - Continue Lisinopril, Norvasc, HCTZ, and Metoprolol - Check BMP - Follow-up in 6 months  T2DM: Well-controlled. A1c 5.9%, up from 5.4% at previous visit. - Patient would like to restart Metformin. Will restart at 500mg  daily. - Foot exam performed today - Referral placed to ophthalmology - Follow-up in 6 months.  HLD: Hx of stroke. Taking Crestor daily. - Continue Crestor daily - Check lipid panel   Hyman Bible, MD PGY-3

## 2017-03-27 NOTE — Patient Instructions (Addendum)
It was so nice to see you!  Let's keep an eye on your headache for now. If it gets worse or if you start having other symptoms such as weakness/tingling, slurred speech, or headache that doesn't go away, please come back to see [place] or go to the ED.  We checked some labs today- I will call you with these results.  I have restarted your Metformin. Please take 1 tablet in the morning.  We will see you back in 6 months or earlier if needed.  -Dr. Brett Albino

## 2017-03-27 NOTE — Assessment & Plan Note (Signed)
Well-controlled. BP 130/78 in clinic today. Goal <140/<90.  - Continue Lisinopril, Norvasc, HCTZ, and Metoprolol - Check BMP - Follow-up in 6 months

## 2017-03-27 NOTE — Assessment & Plan Note (Signed)
Well-controlled. A1c 5.9%, up from 5.4% at previous visit. - Patient would like to restart Metformin. Will restart at 500mg  daily. - Foot exam performed today - Referral placed to ophthalmology - Follow-up in 6 months.

## 2017-03-27 NOTE — Assessment & Plan Note (Signed)
Hx of stroke. Taking Crestor daily. - Continue Crestor daily - Check lipid panel

## 2017-03-28 LAB — LIPID PANEL
Chol/HDL Ratio: 2.9 ratio (ref 0.0–4.4)
Cholesterol, Total: 137 mg/dL (ref 100–199)
HDL: 48 mg/dL (ref >39)
LDL Calculated: 58 mg/dL (ref 0–99)
Triglycerides: 154 mg/dL — ABNORMAL HIGH (ref 0–149)
VLDL Cholesterol Cal: 31 mg/dL (ref 5–40)

## 2017-03-28 LAB — BASIC METABOLIC PANEL WITH GFR
BUN/Creatinine Ratio: 19 (ref 12–28)
BUN: 17 mg/dL (ref 8–27)
CO2: 29 mmol/L (ref 20–29)
Calcium: 9.7 mg/dL (ref 8.7–10.3)
Chloride: 98 mmol/L (ref 96–106)
Creatinine, Ser: 0.91 mg/dL (ref 0.57–1.00)
GFR calc Af Amer: 75 mL/min/1.73 (ref >59)
GFR calc non Af Amer: 65 mL/min/1.73 (ref >59)
Glucose: 107 mg/dL — ABNORMAL HIGH (ref 65–99)
Potassium: 3.1 mmol/L — ABNORMAL LOW (ref 3.5–5.2)
Sodium: 143 mmol/L (ref 134–144)

## 2017-03-29 ENCOUNTER — Other Ambulatory Visit: Payer: Self-pay | Admitting: Internal Medicine

## 2017-03-29 MED ORDER — POTASSIUM CHLORIDE ER 10 MEQ PO CPCR: 10.0000 meq | ORAL_CAPSULE | Freq: Every day | ORAL | 0 refills | Status: DC

## 2017-04-02 ENCOUNTER — Telehealth: Payer: Self-pay | Admitting: *Deleted

## 2017-04-02 NOTE — Telephone Encounter (Signed)
Patient called requesting potassium rx be sent to [place]. Prefers all meds to go to [facility]. [facility] removed from patient med list. Hubbard Hartshorn, RN, BSN '

## 2017-04-03 ENCOUNTER — Other Ambulatory Visit: Payer: Self-pay | Admitting: Internal Medicine

## 2017-04-03 MED ORDER — POTASSIUM CHLORIDE ER 10 MEQ PO CPCR: 10.0000 meq | ORAL_CAPSULE | Freq: Every day | ORAL | 0 refills | Status: DC

## 2017-04-03 NOTE — Telephone Encounter (Signed)
Medication resent to [employer] °

## 2017-05-07 NOTE — Telephone Encounter (Signed)
Error

## 2017-07-22 ENCOUNTER — Other Ambulatory Visit: Payer: Self-pay

## 2017-07-22 ENCOUNTER — Ambulatory Visit (INDEPENDENT_AMBULATORY_CARE_PROVIDER_SITE_OTHER): Payer: Medicare HMO | Admitting: Internal Medicine

## 2017-07-22 ENCOUNTER — Encounter: Payer: Self-pay | Admitting: Internal Medicine

## 2017-07-22 VITALS — BP 118/64 | HR 73 | Temp 97.5°F | Ht 63.0 in | Wt 191.0 lb

## 2017-07-22 DIAGNOSIS — I10 Essential (primary) hypertension: Secondary | ICD-10-CM

## 2017-07-22 DIAGNOSIS — J302 Other seasonal allergic rhinitis: Secondary | ICD-10-CM

## 2017-07-22 DIAGNOSIS — E876 Hypokalemia: Secondary | ICD-10-CM

## 2017-07-22 DIAGNOSIS — Z1231 Encounter for screening mammogram for malignant neoplasm of breast: Secondary | ICD-10-CM

## 2017-07-22 DIAGNOSIS — E785 Hyperlipidemia, unspecified: Secondary | ICD-10-CM

## 2017-07-22 DIAGNOSIS — Z23 Encounter for immunization: Secondary | ICD-10-CM | POA: Diagnosis not present

## 2017-07-22 DIAGNOSIS — M79602 Pain in left arm: Secondary | ICD-10-CM

## 2017-07-22 DIAGNOSIS — Z1239 Encounter for other screening for malignant neoplasm of breast: Secondary | ICD-10-CM

## 2017-07-22 MED ORDER — HYDROCHLOROTHIAZIDE 12.5 MG PO TABS: 12.5000 mg | ORAL_TABLET | Freq: Every day | ORAL | 3 refills | Status: DC

## 2017-07-22 MED ORDER — CLOPIDOGREL BISULFATE 75 MG PO TABS: ORAL_TABLET | ORAL | 2 refills | Status: DC

## 2017-07-22 MED ORDER — TETANUS-DIPHTH-ACELL PERTUSSIS 5-2.5-18.5 LF-MCG/0.5 IM SUSP: 0.5000 mL | Freq: Once | INTRAMUSCULAR | 0 refills | Status: AC

## 2017-07-22 MED ORDER — CETIRIZINE HCL 10 MG PO TABS: 10.0000 mg | ORAL_TABLET | Freq: Every day | ORAL | 0 refills | Status: DC

## 2017-07-22 MED ORDER — METOPROLOL SUCCINATE ER 50 MG PO TB24: 50.0000 mg | ORAL_TABLET | Freq: Every day | ORAL | 3 refills | Status: DC

## 2017-07-22 MED ORDER — DICLOFENAC SODIUM 1 % TD GEL: 2.0000 g | Freq: Four times a day (QID) | TRANSDERMAL | 0 refills | Status: DC

## 2017-07-22 MED ORDER — AMLODIPINE BESYLATE 5 MG PO TABS: 5.0000 mg | ORAL_TABLET | Freq: Every day | ORAL | 2 refills | Status: DC

## 2017-07-22 MED ORDER — MELOXICAM 7.5 MG PO TABS: 7.5000 mg | ORAL_TABLET | Freq: Every day | ORAL | 0 refills | Status: DC

## 2017-07-22 MED ORDER — FAMOTIDINE 40 MG PO TABS: 40.0000 mg | ORAL_TABLET | Freq: Every day | ORAL | 3 refills | Status: DC

## 2017-07-22 MED ORDER — FLUTICASONE PROPIONATE 50 MCG/ACT NA SUSP: 2.0000 | Freq: Every day | NASAL | 6 refills | Status: DC

## 2017-07-22 MED ORDER — LISINOPRIL 20 MG PO TABS: 20.0000 mg | ORAL_TABLET | Freq: Every day | ORAL | 3 refills | Status: DC

## 2017-07-22 MED ORDER — METFORMIN HCL 500 MG PO TABS: 500.0000 mg | ORAL_TABLET | Freq: Every day | ORAL | 0 refills | Status: DC

## 2017-07-22 MED ORDER — POLYETHYLENE GLYCOL 3350 17 G PO PACK: 17.0000 g | PACK | Freq: Every day | ORAL | 0 refills | Status: DC

## 2017-07-22 MED ORDER — ROSUVASTATIN CALCIUM 10 MG PO TABS: 10.0000 mg | ORAL_TABLET | Freq: Every day | ORAL | 3 refills | Status: DC

## 2017-07-22 NOTE — Progress Notes (Signed)
   [facility] Phone: [phone]  Subjective:  Toy is a 67 year old female presenting to clinic with allergies and follow-up of her HTN, HLD, and hypokalemia.  Allergies: Has been having post-nasal drip, congestion, rhinorrhea, itchy/watery eyes, and sneezing for the last week. Post-nasal drip is worse at night. Has not taken anything for this. Denies productive cough, shortness of breath, fevers.  HTN: Taking Norvasc 5mg  daily, HCTZ 12.5mg  daily, Lisinopril 20mg  daily, and Metoprolol succinate 50mg  daily. No side effects. Does not check her BPs at home. No chest pain, no shortness of breath, no LE edema.  HLD: Taking Crestor 10mg  daily. No RUQ, no muscle pain.  Hypokalemia: Had labs done at the heme/onc office 03/27/17. K was 3.1 at the time. Given K-dur 38mEq daily, which she took for 2 weeks. Denies muscle cramps, weakness, palpitations.  ROS: See HPI for pertinent positives and negatives  Past Medical History- HTN, T2DM, hx CVA, HLD, hx breast cancer, obesity  Family history reviewed for today's visit. No changes.  Social history- patient is a current smoker  Objective: BP 118/64   Pulse 73   Temp (!) 97.5 F (36.4 C) (Oral)   Ht 5\' 3"  (1.6 m)   Wt 191 lb (86.6 kg)   SpO2 98%   BMI 33.83 kg/m  Gen: NAD, alert, cooperative with exam HEENT: NCAT, EOMI, MMM, TMs normal, nasal turbinates edematous, eyes mildly watery, oropharynx normal in appearance Neck: FROM, supple, no cervical lymphadenopathy CV: RRR, no murmur Resp: CTABL, no wheezes, normal work of breathing GI: SNTND, BS present, no guarding or organomegaly Msk: No edema, warm, normal tone, moves UE/LE spontaneously Neuro: Alert and oriented, no gross deficits Skin: No rashes, no lesions Psych: Appropriate behavior  Assessment/Plan: Seasonal Allergies: Having post-nasal drip, sneezing, congestion, itchy/watery eyes, consistent with allergies, especially at this time of year. No  signs of acute bacterial sinusitis. - Start Zyrtec 10mg  daily and Flonase 2 sprays per nostril daily - Follow-up as needed  HTN: Well-controlled. BP 118/64 today. - Refills sent for Norvasc 5mg  daily, HCTZ 12.5mg  daily, Lisinopril 20mg  daily, and Metoprolol succinate 50mg  daily - Could consider stopping HCTZ, as she is on such a low dose and her BP is very well-controlled for her age. - Follow-up in 6 months  HLD: Well-controlled. Last lipid panel 03/27/17 with chol 137, HDL 48, LDL 58, and TG 154. - Crestor 10mg  daily refilled  Hypokalemia: Noted on labs from 03/27/17. K was 3.1. Took K-dur 1-mEq daily x 2 weeks. May be related to HCTZ. - Check BMP - Consider stopping HCTZ   Hyman Bible, MD PGY-3

## 2017-07-22 NOTE — Patient Instructions (Signed)
It was so nice to see you today!  For your allergies- I have prescribed Zyrtec, which is a pill that you should take every day. I have also prescribed Flonase, which is a nasal spray. Please use 2 sprays daily in each nostril. Make sure you point the spray to the outside to help coat your sinuses better.  For your low potassium- I have rechecked a potassium level. I will call you with those results.  I have put in an order for a mammogram. You should get this scheduled in June.  We will see you back in 6 months or earlier if needed.  -Dr. Brett Albino

## 2017-07-23 LAB — BASIC METABOLIC PANEL
BUN/Creatinine Ratio: 14 (ref 12–28)
BUN: 12 mg/dL (ref 8–27)
CO2: 31 mmol/L — ABNORMAL HIGH (ref 20–29)
Calcium: 9.9 mg/dL (ref 8.7–10.3)
Chloride: 99 mmol/L (ref 96–106)
Creatinine, Ser: 0.83 mg/dL (ref 0.57–1.00)
GFR calc Af Amer: 84 mL/min/{1.73_m2} (ref >59)
GFR calc non Af Amer: 73 mL/min/{1.73_m2} (ref >59)
Glucose: 96 mg/dL (ref 65–99)
Potassium: 3 mmol/L — ABNORMAL LOW (ref 3.5–5.2)
Sodium: 143 mmol/L (ref 134–144)

## 2017-07-23 NOTE — Assessment & Plan Note (Signed)
Well-controlled. BP 118/64 today. - Refills sent for HCTZ 12.5mg  daily, Lisinopril 20mg  daily, and Metoprolol succinate 50mg  daily - Could consider stopping HCTZ, as she is on such a low dose and her BP is very well-controlled for her age. - Follow-up in 6 months

## 2017-07-23 NOTE — Assessment & Plan Note (Signed)
Having post-nasal drip, sneezing, congestion, itchy/watery eyes, consistent with allergies, especially at this time of year. No signs of acute bacterial sinusitis. - Start Zyrtec 10mg  daily and Flonase 2 sprays per nostril daily - Follow-up as needed

## 2017-07-23 NOTE — Assessment & Plan Note (Signed)
Well-controlled. Last lipid panel 03/27/17 with chol 137, HDL 48, LDL 58, and TG 154. - Crestor 10mg  daily refilled

## 2017-07-23 NOTE — Assessment & Plan Note (Signed)
Noted on labs from 03/27/17. K was 3.1. Took K-dur 1-mEq daily x 2 weeks. May be related to HCTZ. - Check BMP - Consider stopping HCTZ

## 2017-07-24 ENCOUNTER — Telehealth: Payer: Self-pay | Admitting: Internal Medicine

## 2017-07-24 NOTE — Telephone Encounter (Signed)
Called patient to discuss her lab results. Potassium remains low at 3.0. Suspect this is likely due to HCTZ use. Her blood pressure looked great at her most recent clinic visit (118/60) and her goal BP is <140/<90. She is on a low dose of the HCTZ (12.5mg ). Will plan to stop the HCTZ and recheck her potassium and BP in the next 2 months. If becomes elevated, can consider increasing dose of Norvasc or Lisinopril.  Plan discussed with patient over the phone. She will make an appointment in June. All questions answered.  Hyman Bible, MD PGY-3

## 2017-08-26 ENCOUNTER — Ambulatory Visit: Payer: Medicare HMO | Admitting: Internal Medicine

## 2017-09-19 ENCOUNTER — Encounter: Payer: Medicare HMO | Admitting: Internal Medicine

## 2017-09-25 ENCOUNTER — Encounter: Payer: Self-pay | Admitting: Internal Medicine

## 2017-09-25 ENCOUNTER — Other Ambulatory Visit: Payer: Self-pay

## 2017-09-25 ENCOUNTER — Ambulatory Visit (INDEPENDENT_AMBULATORY_CARE_PROVIDER_SITE_OTHER): Payer: Medicare HMO | Admitting: Internal Medicine

## 2017-09-25 VITALS — BP 100/70 | HR 76 | Temp 97.8°F | Wt 197.0 lb

## 2017-09-25 DIAGNOSIS — E119 Type 2 diabetes mellitus without complications: Secondary | ICD-10-CM

## 2017-09-25 DIAGNOSIS — Z01419 Encounter for gynecological examination (general) (routine) without abnormal findings: Secondary | ICD-10-CM | POA: Diagnosis not present

## 2017-09-25 DIAGNOSIS — I10 Essential (primary) hypertension: Secondary | ICD-10-CM | POA: Diagnosis not present

## 2017-09-25 LAB — POCT GLYCOSYLATED HEMOGLOBIN (HGB A1C): HbA1c, POC (controlled diabetic range): 5.9 % (ref 0.0–7.0)

## 2017-09-25 NOTE — Progress Notes (Signed)
67 y.o. year old female presents for well woman/preventative visit and annual GYN examination.  Acute Concerns: 1. HTN- going well. Does not check her blood pressures at home. HCTZ was stopped at last visit on 07/22/17 because BPs were well-controlled and she was having persistent hypokalemia. Does not check her blood pressure at home. Denies any chest pain, shortness of breath, lower extremity edema, or dizziness. 2. T2DM- Taking metformin. No side effects to this. Does not check her blood sugars at home. Denies polyuria and polydipsia.  Diet: Tries to eat healthy. Cutting back on sugary beverages and tries to eat a lot of fruits and vegetables.  Exercise: Does not exercise. Discussed walking for 30 minutes at least 5 times per day.  Sexual/Birth History: Not sexually active.  Birth Control: Hysterectomy  Social:  Social History   Socioeconomic History  . Marital status: Divorced    Spouse name: Not on file  . Number of children: Not on file  . Years of education: 37  . Highest education level: Not on file  Occupational History    Employer: UNEMPLOYED  Social Needs  . Financial resource strain: Not on file  . Food insecurity:    Worry: Not on file    Inability: Not on file  . Transportation needs:    Medical: Not on file    Non-medical: Not on file  Tobacco Use  . Smoking status: Current Every Day Smoker    Packs/day: 0.25    Years: 50.00    Pack years: 12.50    Types: Cigarettes  . Smokeless tobacco: Never Used  Substance and Sexual Activity  . Alcohol use: Yes    Comment: Once a week.   . Drug use: No  . Sexual activity: Not on file  Lifestyle  . Physical activity:    Days per week: Not on file    Minutes per session: Not on file  . Stress: Not on file  Relationships  . Social connections:    Talks on phone: Not on file    Gets together: Not on file    Attends religious service: Not on file    Active member of club or organization: Not on file    Attends  meetings of clubs or organizations: Not on file    Relationship status: Not on file  Other Topics Concern  . Not on file  Social History Narrative  . Not on file    Immunization: Immunization History  Administered Date(s) Administered  . Influenza Split 01/02/2012  . Influenza Whole 02/04/2006, 02/10/2007, 01/28/2008  . Influenza, High Dose Seasonal PF 01/30/2017  . Influenza,inj,Quad PF,6+ Mos 02/04/2013, 05/18/2016  . Influenza-Unspecified 02/10/2014, 01/21/2017  . Pneumococcal Conjugate-13 05/18/2016  . Pneumococcal Polysaccharide-23 11/21/2004, 07/22/2017  . Td 11/21/2004  . Zoster 07/02/2012    Cancer Screening:  Pap Smear: n/a- hysterectomy  Mammogram: mammogram scheduled for 09/30/17  Colonoscopy: due 02/05/18  Dexa: done 03/13/2007 and was normal  Physical Exam: VITALS: Reviewed GEN: Pleasant female, NAD HEENT: Normocephalic, PERRL, EOMI, no scleral icterus, bilateral TM pearly grey, nasal septum midline, MMM, uvula midline, no anterior or posterior lymphadenopathy, no thyromegaly CARDIAC:RRR, S1 and S2 present, no murmur, no heaves/thrills RESP: CTAB, normal effort ABD: soft, no tenderness, normal bowel sounds EXT: No edema, 2+ radial and DP pulses SKIN: no rash  ASSESSMENT & PLAN: 67 y.o. female presents for annual well woman/preventative exam.   HTN: Likely too well-controlled for age. BP 100/70 today. Stopped HCTZ 07/24/17 due to hypokalemia and well-controlled BPs.  -  Will stop Norvasc for now - Continue Lisinopril and Metoprolol succinate - Follow-up in 3 months for BP check. If BPs high at that time, could consider increasing Lisinopril dose.  T2DM: Well-controlled. A1c 5.9% in clinic today. - Continue Metformin 500mg  bid - Had ophthalmology exam in Dec 2018 or Jan 2019. Will obtain records - Plan to recheck A1c in 6 months   Hyman Bible, MD PGY-3

## 2017-09-25 NOTE — Patient Instructions (Signed)
It was so wonderful to see you today!  Everything looks great today. We have rechecked your electrolytes and kidneys. I will call you with these results.  Your blood pressure looks really good today. Let's stop your Norvasc. We will see you back in 3 months to recheck your blood pressure.  -Dr. Brett Albino

## 2017-09-26 LAB — BASIC METABOLIC PANEL
BUN/Creatinine Ratio: 16 (ref 12–28)
BUN: 15 mg/dL (ref 8–27)
CO2: 27 mmol/L (ref 20–29)
Calcium: 9.8 mg/dL (ref 8.7–10.3)
Chloride: 100 mmol/L (ref 96–106)
Creatinine, Ser: 0.93 mg/dL (ref 0.57–1.00)
GFR calc Af Amer: 73 mL/min/{1.73_m2} (ref >59)
GFR calc non Af Amer: 63 mL/min/{1.73_m2} (ref >59)
Glucose: 93 mg/dL (ref 65–99)
Potassium: 3.9 mmol/L (ref 3.5–5.2)
Sodium: 142 mmol/L (ref 134–144)

## 2017-09-27 ENCOUNTER — Telehealth: Payer: Self-pay | Admitting: Internal Medicine

## 2017-09-27 NOTE — Telephone Encounter (Signed)
Called patient to discuss lab results. Potassium returned back to normal after stopping HCTZ. Questions answered.  Hyman Bible, MD PGY-3

## 2017-09-27 NOTE — Assessment & Plan Note (Signed)
Well-controlled. A1c 5.9% in clinic today. - Continue Metformin 500mg  bid - Had ophthalmology exam in Dec 2018 or Jan 2019. Will obtain records - Plan to recheck A1c in 6 months

## 2017-09-27 NOTE — Assessment & Plan Note (Signed)
Likely too well-controlled for age. BP 100/70 today. Stopped HCTZ 07/24/17 due to hypokalemia and well-controlled BPs.  - Will stop Norvasc for now - Continue Lisinopril and Metoprolol succinate - Follow-up in 3 months for BP check. If BPs high at that time, could consider increasing Lisinopril dose.

## 2017-09-30 ENCOUNTER — Ambulatory Visit
Admission: RE | Admit: 2017-09-30 | Discharge: 2017-09-30 | Disposition: A | Discharge status: Home / Self Care | Payer: Medicare HMO | Source: Ambulatory Visit | Attending: Family Medicine | Admitting: Family Medicine

## 2017-09-30 DIAGNOSIS — Z1231 Encounter for screening mammogram for malignant neoplasm of breast: Secondary | ICD-10-CM | POA: Diagnosis not present

## 2017-09-30 DIAGNOSIS — Z1239 Encounter for other screening for malignant neoplasm of breast: Secondary | ICD-10-CM

## 2017-09-30 HISTORY — DX: Personal history of antineoplastic chemotherapy: Z92.21

## 2017-10-07 ENCOUNTER — Other Ambulatory Visit: Payer: Self-pay | Admitting: Internal Medicine

## 2017-10-07 DIAGNOSIS — M79602 Pain in left arm: Secondary | ICD-10-CM

## 2017-10-24 NOTE — Progress Notes (Signed)
Subjective:   Patient ID: Wendy Heath    DOB: 03-Oct-1950, 67 y.o. female   MRN: [id number]  Wendy Heath is a 67 y.o. female with a history of HTN, GERD, DM2, HLD, obesity, h/o stroke here for   - HTN and DM f/u 6/5 - stopped norvasc, DM stable on current regimen. F/u in 3 and 6 months respectively. - normal mammogram 6/10  R knee pain Endorses right knee pain that has been going on for years.  Years ago she was referred to sports medicine where she got a cortisone injection that relieved her pain for about 4 years.  She states the pain has slowly started to come back and now requests repeat cortisone injection.  Her pain is located in the inside of her knee and sometimes has lower leg pain when walking or standing for long periods of time.  Most recent knee x-ray 12/2008 showed bilateral knee OA with involvement of the medial compartments.  She denies recent trauma or falls.  Previously tried gabapentin and Voltaren gel which helps a little.  She denies redness or swelling of the joint she denies fevers, weight loss, or rashes.  Review of Systems:  Per HPI.  [place], medications and smoking status reviewed.  Objective:   BP 124/70   Pulse 77   Temp 98.3 F (36.8 C) (Oral)   Ht 5\' 3"  (1.6 m)   Wt 195 lb (88.5 kg)   SpO2 97%   BMI 34.54 kg/m  Vitals and nursing note reviewed.  General: obese female, in no acute distress with non-toxic appearance Skin: warm, dry, no rashes or lesions Extremities: warm and well perfused, normal tone MSK: ROM grossly intact, strength intact, gait normal. Crepitus palpated in R knee with flexion and extension. Pain with pressure against patella and with valgus and varus stress. No pain with internal or external rotation. Negative Lachman's.  No effusion, redness noted. Neuro: Alert and oriented, speech normal  INJECTION: Patient was given informed consent, signed copy in the chart. Appropriate time out was taken. Area prepped and draped in usual  sterile fashion. 1 cc of methylprednisolone 40 mg/ml plus  4 cc of 1% lidocaine without epinephrine was injected into the R knee using a(n) posterior lateral approach. The patient tolerated the procedure well. There were no complications.    Assessment & Plan:   Chronic pain of right knee History and exam consistent with osteoarthritis. Will obtain updated bilateral knee x-rays to assess joint space.  Given patient previously received great relief with cortisone injection, provided today with good toleration.  Post procedure instructions given.  Refill given for Voltaren gel.  Follow-up as needed.  Essential hypertension Norvasc stopped at last visit due to consistently low BP for age.  BP in range today without symptoms of hypotension, no changes made.  Follow-up as regularly scheduled.  Orders Placed This Encounter  Procedures  . DG Knee Bilateral Standing AP    Standing Status:   Future    Standing Expiration Date:   12/31/2018    Order Specific Question:   Reason for Exam (SYMPTOM  OR DIAGNOSIS REQUIRED)    Answer:   knee pain    Order Specific Question:   Preferred imaging location?    Answer:   [facility]    Order Specific Question:   Radiology Contrast Protocol - do NOT remove file path    Answer:   [email or web address]s.pdf   Meds ordered this encounter  Medications  . methylPREDNISolone acetate (DEPO-MEDROL) injection  40 mg  . cetirizine (ZYRTEC) 10 MG tablet    Sig: Take 1 tablet (10 mg total) by mouth daily.    Dispense:  90 tablet    Refill:  0  . diclofenac sodium (VOLTAREN) 1 % GEL    Sig: APPLY 2 GM TOPICALLY 4 (FOUR) TIMES DAILY as needed    Dispense:  100 g    Refill:  0  . gabapentin (NEURONTIN) 300 MG capsule    Sig: Take 1 capsule (300 mg total) by mouth 3 (three) times daily.    Dispense:  90 capsule    Refill:  5  . metFORMIN (GLUCOPHAGE) 500 MG tablet    Sig: Take 1 tablet (500 mg total) by mouth daily with  breakfast.    Dispense:  90 tablet    Refill:  [place], DO PGY-2, [facility] 10/29/2017 8:59 PM

## 2017-10-29 ENCOUNTER — Other Ambulatory Visit: Payer: Self-pay

## 2017-10-29 ENCOUNTER — Encounter: Payer: Self-pay | Admitting: Family Medicine

## 2017-10-29 ENCOUNTER — Ambulatory Visit (INDEPENDENT_AMBULATORY_CARE_PROVIDER_SITE_OTHER): Payer: Medicare HMO | Admitting: Family Medicine

## 2017-10-29 VITALS — BP 124/70 | HR 77 | Temp 98.3°F | Ht 63.0 in | Wt 195.0 lb

## 2017-10-29 DIAGNOSIS — I10 Essential (primary) hypertension: Secondary | ICD-10-CM

## 2017-10-29 DIAGNOSIS — G8929 Other chronic pain: Secondary | ICD-10-CM | POA: Diagnosis not present

## 2017-10-29 DIAGNOSIS — M25561 Pain in right knee: Secondary | ICD-10-CM | POA: Diagnosis not present

## 2017-10-29 DIAGNOSIS — M79602 Pain in left arm: Secondary | ICD-10-CM | POA: Diagnosis not present

## 2017-10-29 MED ORDER — GABAPENTIN 300 MG PO CAPS: 300.0000 mg | ORAL_CAPSULE | Freq: Three times a day (TID) | ORAL | 5 refills | Status: DC

## 2017-10-29 MED ORDER — METHYLPREDNISOLONE ACETATE 40 MG/ML IJ SUSP
40.0000 mg | Freq: Once | INTRAMUSCULAR | Status: AC
  Administered 2017-10-29: 40 mg via INTRAMUSCULAR

## 2017-10-29 MED ORDER — CETIRIZINE HCL 10 MG PO TABS: 10.0000 mg | ORAL_TABLET | Freq: Every day | ORAL | 0 refills | Status: DC

## 2017-10-29 MED ORDER — METFORMIN HCL 500 MG PO TABS: 500.0000 mg | ORAL_TABLET | Freq: Every day | ORAL | 3 refills | Status: DC

## 2017-10-29 MED ORDER — DICLOFENAC SODIUM 1 % TD GEL: TRANSDERMAL | 0 refills | Status: DC

## 2017-10-29 NOTE — Assessment & Plan Note (Signed)
Norvasc stopped at last visit due to consistently low BP for age.  BP in range today without symptoms of hypotension, no changes made.  Follow-up as regularly scheduled.

## 2017-10-29 NOTE — Patient Instructions (Signed)
It was great to see you!  Our plans for today:  - We will obtain a knee xray. - We gave a cortisone shot to help your pain. - Continue to use voltaren gel as needed for pain relief.  Take care and seek immediate care sooner if you develop any concerns.   Dr. [facility]

## 2017-10-29 NOTE — Assessment & Plan Note (Addendum)
History and exam consistent with osteoarthritis. Will obtain updated bilateral knee x-rays to assess joint space.  Given patient previously received great relief with cortisone injection, provided today with good toleration.  Post procedure instructions given.  Refill given for Voltaren gel.  Follow-up as needed.

## 2017-11-12 ENCOUNTER — Other Ambulatory Visit: Payer: Self-pay

## 2017-11-12 MED ORDER — FAMOTIDINE 40 MG PO TABS: 40.0000 mg | ORAL_TABLET | Freq: Every day | ORAL | 3 refills | Status: DC

## 2017-11-12 NOTE — Telephone Encounter (Signed)
Pt called nurse line requesting a refill to be sent to her [place]. Medication pended. Please advise.

## 2018-02-17 ENCOUNTER — Other Ambulatory Visit: Payer: Self-pay

## 2018-02-17 ENCOUNTER — Ambulatory Visit (INDEPENDENT_AMBULATORY_CARE_PROVIDER_SITE_OTHER): Payer: Medicare HMO | Admitting: Family Medicine

## 2018-02-17 VITALS — BP 110/68 | HR 72 | Temp 98.3°F | Ht 63.0 in | Wt 194.0 lb

## 2018-02-17 DIAGNOSIS — M25561 Pain in right knee: Secondary | ICD-10-CM | POA: Diagnosis not present

## 2018-02-17 DIAGNOSIS — M25562 Pain in left knee: Secondary | ICD-10-CM

## 2018-02-17 DIAGNOSIS — M79602 Pain in left arm: Secondary | ICD-10-CM

## 2018-02-17 MED ORDER — GABAPENTIN 400 MG PO CAPS: 400.0000 mg | ORAL_CAPSULE | Freq: Three times a day (TID) | ORAL | 2 refills | Status: DC

## 2018-02-17 NOTE — Progress Notes (Signed)
   Subjective:   Patient ID: Wendy Heath    DOB: [birth year], 67 y.o. female   MRN: [id number]  CC: Bilateral leg pain  HPI: FONDA ROCHON is a 67 y.o. female who presents to clinic today for the following issue.  Bilateral leg pain  Patient reports symptoms x1 week acute onset.  No history of fall or injury to back/legs. She has never had leg pain in the past.  Denies numbness and tingling.  Her legs feel achy and pain is dull in nature.  No history of arthritis.  She takes gabapentin 300 mg TID for arm pain. She has not tried any other medications.  No fevers, chills, nausea, vomiting.  No loss of bowel or bladder control.   ROS: See HPI for pertinent ROS.  Social: Patient is a current everyday smoker. Medications reviewed. Objective:   BP 110/68   Pulse 72   Temp 98.3 F (36.8 C) (Oral)   Ht 5\' 3"  (1.6 m)   Wt 194 lb (88 kg)   SpO2 98%   BMI 34.37 kg/m  Vitals and nursing note reviewed.  General: 67 year old female, NAD Neck: supple CV: RRR no MRG  Lungs: CTAB, normal effort  Abdomen: soft, +bs  Skin: warm, dry, no rash MSK: Lower extremity-no obvious deformity, no swelling or tenderness to palpation over lower extremities bilaterally.  Range of motion is normal.  Extremities: warm and well perfused, normal tone Neuro: alert, oriented x3, no focal deficits, negative straight leg test, motor strength 5/5 bilaterally in lower extremities, normal sensation.  Assessment & Plan:   Leg pain Medications reviewed, besides Crestor do not see any medications that may contribute to myalgias.  Patient states she has been tolerating Crestor well.  Do not suspect sciatica or radicular back pain at this time.  Recommend heating pad, increase gabapentin to 400 mg TID.  Advised topical IcyHot.  Gentle stretching and range of motion exercises.  Could consider obtaining plain films of lower back for further evaluation. Follow-up if symptoms worsen or do not improve.  Meds ordered  this encounter  Medications  . gabapentin (NEURONTIN) 400 MG capsule    Sig: Take 1 capsule (400 mg total) by mouth 3 (three) times daily.    Dispense:  30 capsule    Refill:  2   Lovenia Kim, MD [place], PGY-3 02/25/2018 11:28 PM

## 2018-02-17 NOTE — Patient Instructions (Addendum)
It was nice meeting you today.  You were seen in clinic for bilateral leg pain.  It is possible this may be coming from your lower back, however we have held off on obtaining x-rays today.  As we discussed, I have increased your gabapentin to 400 mg times a day and sent this to your pharmacy.  I would also advise topical IcyHot, gentle stretching and strengthening exercises.  If you have ongoing symptoms, I would advise making an appointment to be seen again.  Please call clinic if any questions.  Lovenia Kim MD

## 2018-02-20 ENCOUNTER — Ambulatory Visit: Payer: Medicare HMO | Admitting: Family Medicine

## 2018-03-27 ENCOUNTER — Other Ambulatory Visit: Payer: Self-pay

## 2018-03-27 DIAGNOSIS — M79602 Pain in left arm: Secondary | ICD-10-CM

## 2018-03-30 MED ORDER — DICLOFENAC SODIUM 1 % TD GEL: TRANSDERMAL | 0 refills | Status: DC

## 2018-03-30 MED ORDER — GABAPENTIN 400 MG PO CAPS: 400.0000 mg | ORAL_CAPSULE | Freq: Three times a day (TID) | ORAL | 0 refills | Status: DC

## 2018-03-31 NOTE — Progress Notes (Signed)
Subjective:   Patient ID: Wendy Heath    DOB: 1951/02/07, 67 y.o. female   MRN: [id number]  Wendy Heath is a 67 y.o. female with a history of HTN, GERD, T2DM, HLD, obesity, h/o CVA, h/o breast cancer here for   Leg pain Seen 10/28 for bilateral leg pain with normal exam, at that time pain had been present for 1 week.  She was told to increase gabapentin dosing and try heating pad, stretching, and ROM exercises.  Has tried strengthening exercises only sometimes, reports heating pad does not help.  States pain is constant and feels like "an achy toothache."  Reports pain starts in her thigh and goes down the front of her leg.  Reports sometimes it is hard to walk due to the pain, but also experiences pain at rest occasionally.  She will raise her legs with some relief in pain.  Has tried to rub her legs without much relief.  States increasing gabapentin dose has not helped alleviate pain.  Denies fevers, blood in urine or stool, change in bowel habits.  She is eating and drinking normally.  Denies pain in her feet or swelling in her legs.  Has tried Mobic without much relief, but does report Voltaren gel helps.  Has been on current dose of statin for many years since her stroke around 2003.  Review of Systems:  Per HPI.  [place], medications and smoking status reviewed.  Objective:   BP 134/90   Pulse 84   Temp 97.7 F (36.5 C) (Oral)   Wt 193 lb (87.5 kg)   SpO2 96%   BMI 34.19 kg/m  Vitals and nursing note reviewed.  General: Elderly obese female, in no acute distress with non-toxic appearance CV: regular rate and rhythm without murmurs, rubs, or gallops, no lower extremity edema Lungs: clear to auscultation bilaterally with normal work of breathing Skin: warm, dry, no rashes or lesions Extremities: warm and well perfused, normal tone MSK: ROM grossly intact, 5/5 strength to U/LE bilaterally. Slowed standing from seated position, slowed gait Neuro: Alert and oriented, speech  normal  Assessment & Plan:   Pain in both thighs Unclear etiology, appears to be muscular in origin given achy character and distribution of pain.  Some concern for deconditioning given slowed standing from seated position and history of difficulty walking up stairs with associated pain.  Doubt claudication given history of pain with rest.  Doubt neurogenic etiology given lack of classic radicular symptoms, will have patient decrease gabapentin dose back to 325m TID.  With proximal muscle pain, will check CK and ESR to rule out autoimmune issues such as poly-myositis.  Is on statin, however not likely etiology of pain given no recent changes in dose and has been on for years.  Encouraged weight loss and strengthening exercises, follow-up in 1 month if no improvement.  Orders Placed This Encounter  Procedures  . CK  . Sedimentation Rate  . Ambulatory referral to Ophthalmology    Referral Priority:   Routine    Referral Type:   Consultation    Referral Reason:   Specialty Services Required    Requested Specialty:   Ophthalmology    Number of Visits Requested:   1  . HgB A1c   Meds ordered this encounter  Medications  . diclofenac sodium (VOLTAREN) 1 % GEL    Sig: APPLY 2 GM TOPICALLY 4 (FOUR) TIMES DAILY as needed    Dispense:  100 g    Refill:  0  Rory Percy, DO PGY-2, [facility] 04/01/2018 2:36 PM

## 2018-04-01 ENCOUNTER — Ambulatory Visit (INDEPENDENT_AMBULATORY_CARE_PROVIDER_SITE_OTHER): Payer: Medicare HMO | Admitting: Family Medicine

## 2018-04-01 ENCOUNTER — Other Ambulatory Visit: Payer: Self-pay

## 2018-04-01 ENCOUNTER — Encounter: Payer: Self-pay | Admitting: Family Medicine

## 2018-04-01 VITALS — BP 134/90 | HR 84 | Temp 97.7°F | Wt 193.0 lb

## 2018-04-01 DIAGNOSIS — E119 Type 2 diabetes mellitus without complications: Secondary | ICD-10-CM | POA: Diagnosis not present

## 2018-04-01 DIAGNOSIS — M79602 Pain in left arm: Secondary | ICD-10-CM | POA: Diagnosis not present

## 2018-04-01 DIAGNOSIS — M79652 Pain in left thigh: Secondary | ICD-10-CM | POA: Diagnosis not present

## 2018-04-01 DIAGNOSIS — M79651 Pain in right thigh: Secondary | ICD-10-CM

## 2018-04-01 HISTORY — DX: Pain in left thigh: M79.652

## 2018-04-01 HISTORY — DX: Pain in right thigh: M79.651

## 2018-04-01 LAB — POCT GLYCOSYLATED HEMOGLOBIN (HGB A1C): HbA1c, POC (controlled diabetic range): 5.7 % (ref 0.0–7.0)

## 2018-04-01 MED ORDER — DICLOFENAC SODIUM 1 % TD GEL: TRANSDERMAL | 0 refills | Status: DC

## 2018-04-01 NOTE — Patient Instructions (Signed)
It was great to see you!  Our plans for today:  - We are checking some labs today, we will call you or send you a letter if they are abnormal.  - Do quadricept strengthening exercises each day, walking up stairs is a great exercise for this. - Work to lose weight, this will also help your back pain.  Take care and seek immediate care sooner if you develop any concerns.   Dr. [facility]

## 2018-04-01 NOTE — Assessment & Plan Note (Signed)
Unclear etiology, appears to be muscular in origin given achy character and distribution of pain.  Some concern for deconditioning given slowed standing from seated position and history of difficulty walking up stairs with associated pain.  Doubt claudication given history of pain with rest.  Doubt neurogenic etiology given lack of classic radicular symptoms, will have patient decrease gabapentin dose back to 347m TID.  With proximal muscle pain, will check CK and ESR to rule out autoimmune issues such as poly-myositis.  Is on statin, however not likely etiology of pain given no recent changes in dose and has been on for years.  Encouraged weight loss and strengthening exercises, follow-up in 1 month if no improvement.

## 2018-04-02 ENCOUNTER — Encounter: Payer: Self-pay | Admitting: Family Medicine

## 2018-04-02 LAB — SEDIMENTATION RATE: Sed Rate: 33 mm/hr (ref 0–40)

## 2018-04-02 LAB — CK: Total CK: 72 U/L (ref 24–173)

## 2018-05-16 ENCOUNTER — Ambulatory Visit (INDEPENDENT_AMBULATORY_CARE_PROVIDER_SITE_OTHER): Payer: Medicare HMO | Admitting: Family Medicine

## 2018-05-16 ENCOUNTER — Other Ambulatory Visit: Payer: Self-pay

## 2018-05-16 VITALS — BP 110/62 | HR 84 | Temp 98.1°F | Wt 187.1 lb

## 2018-05-16 DIAGNOSIS — R42 Dizziness and giddiness: Secondary | ICD-10-CM

## 2018-05-16 HISTORY — DX: Dizziness and giddiness: R42

## 2018-05-16 NOTE — Progress Notes (Signed)
   Subjective:    Patient ID: Wendy Heath, female    DOB: Feb 09, 1951, 68 y.o.   MRN: [id number]   CC: Dizziness  HPI: Patient is a 68 yo female who presents today complaining of dizziness. Patient reports that she has been feeling dizzy in the morning when she gets up from lying down in the bed to sitting up. She has to sit for a couple minutes before she can stand up. Patient denies any chest pain, palpitations, SOB or neurologic deficits. Patient is currently on three blood pressure meds. She denies any recent fall or head trauma. Patient denies any nausea or vomiting, hearing or vision changes.  Smoking status reviewed   ROS: all other systems were reviewed and are negative other than in the HPI   Past Medical History:  Diagnosis Date  . Blind right eye 07/04/2011   Since stroke 2003  . CEREBROVASCULAR ACCIDENT, HX OF 08/24/2006  . Diverticulitis   . GERD 08/24/2006  . HYPERLIPIDEMIA 08/24/2006  . HYPERTENSION 08/24/2006  . Impaired glucose tolerance 07/04/2011  . Malignant neoplasm of breast (female), unspecified site 03/13/2011  . Personal history of chemotherapy   . Personal history of radiation therapy 12/26/2006  . Stroke [facility]) 2005    Past Surgical History:  Procedure Laterality Date  . ABDOMINAL HYSTERECTOMY  1995  . BREAST BIOPSY  06/10/2006   malignant  . BREAST LUMPECTOMY Left 11/25/2006   malignant  . COLONOSCOPY N/A 02/05/2013   Procedure: COLONOSCOPY;  Surgeon: Beryle Beams, MD;  Location: [facility] ENDOSCOPY;  Service: Endoscopy;  Laterality: N/A;    Past medical history, surgical, family, and social history reviewed and updated in the EMR as appropriate.  Objective:  BP 110/62   Pulse 84   Temp 98.1 F (36.7 C) (Oral)   Wt 187 lb 2 oz (84.9 kg)   SpO2 96%   BMI 33.15 kg/m   Vitals and nursing note reviewed  General: NAD, pleasant, able to participate in exam Cardiac: RRR, normal heart sounds, no murmurs. 2+ radial and PT pulses bilaterally Respiratory:  CTAB, normal effort, No wheezes, rales or rhonchi Abdomen: soft, nontender, nondistended, no hepatic or splenomegaly, +BS Extremities: no edema or cyanosis. WWP. Skin: warm and dry, no rashes noted Neuro: alert and oriented x4, no focal deficits Psych: Normal affect and mood   Assessment & Plan:   Dizziness Patient presents with dizziness upon get out of bed in the morning. Patient denies any fall, trauma or other concerning neurologic symptoms. Physical exam was unremarkable but patient reported dizziness during orthostatic vitals. BP on arrival 110/62. Orthostatic (no HR recorded) Supine: 102/60 Sitting: 112/62 Standing: 110/44 Given symptoms, exam findings suspect dizziness is secondary to postural hypotension. Could also consider BPPV, meniere and other vestibular pathology though not consistent with symptoms. Will discontinue metoprolol. Patient will continue with amlodipine and lisinopril. She will follow up in two weeks for BP monitoring and reevaluation. Given age, do not need aggressive BP control.    Marjie Skiff, MD [place] PGY-3

## 2018-05-16 NOTE — Patient Instructions (Signed)
It was great seeing you today! We have addressed the following issues today  1. Please stop your metoprolol and continue lisinopril and amlodipine. 2. Follow up in two weeks with you regular doctor.  If we did any lab work today, and the results require attention, either me or my nurse will get in touch with you. If everything is normal, you will get a letter in mail and a message via . If you don't hear from [place] in two weeks, please give [place] a call. Otherwise, we look forward to seeing you again at your next visit. If you have any questions or concerns before then, please call the clinic at [phone].  Please bring all your medications to every doctors visit  Sign up for My Chart to have easy access to your labs results, and communication with your Primary care physician. Please ask Front Desk for some assistance.   Please check-out at the front desk before leaving the clinic.    Take Care,   Dr. Andy Gauss

## 2018-05-16 NOTE — Assessment & Plan Note (Addendum)
Patient presents with dizziness upon get out of bed in the morning. Patient denies any fall, trauma or other concerning neurologic symptoms. Physical exam was unremarkable but patient reported dizziness during orthostatic vitals. BP on arrival 110/62. Orthostatic (no HR recorded) Supine: 102/60 Sitting: 112/62 Standing: 110/44 Given symptoms, exam findings suspect dizziness is secondary to postural hypotension. Could also consider BPPV, meniere and other vestibular pathology though not consistent with symptoms. Will discontinue metoprolol. Patient will continue with amlodipine and lisinopril. She will follow up in two weeks for BP monitoring and reevaluation. Given age, do not need aggressive BP control.

## 2018-05-29 ENCOUNTER — Ambulatory Visit: Payer: Medicare HMO | Admitting: Family Medicine

## 2018-05-30 DIAGNOSIS — H524 Presbyopia: Secondary | ICD-10-CM | POA: Diagnosis not present

## 2018-05-30 DIAGNOSIS — H2513 Age-related nuclear cataract, bilateral: Secondary | ICD-10-CM | POA: Diagnosis not present

## 2018-06-18 ENCOUNTER — Other Ambulatory Visit: Payer: Self-pay

## 2018-06-18 ENCOUNTER — Ambulatory Visit (INDEPENDENT_AMBULATORY_CARE_PROVIDER_SITE_OTHER): Payer: Medicare HMO | Admitting: Family Medicine

## 2018-06-18 ENCOUNTER — Encounter: Payer: Self-pay | Admitting: Family Medicine

## 2018-06-18 VITALS — BP 100/60 | HR 97 | Temp 98.2°F | Wt 189.5 lb

## 2018-06-18 DIAGNOSIS — I10 Essential (primary) hypertension: Secondary | ICD-10-CM

## 2018-06-18 DIAGNOSIS — I951 Orthostatic hypotension: Secondary | ICD-10-CM | POA: Diagnosis not present

## 2018-06-18 DIAGNOSIS — R42 Dizziness and giddiness: Secondary | ICD-10-CM

## 2018-06-18 MED ORDER — TETANUS-DIPHTH-ACELL PERTUSSIS 5-2.5-18.5 LF-MCG/0.5 IM SUSP: 0.5000 mL | Freq: Once | INTRAMUSCULAR | 0 refills | Status: AC

## 2018-06-18 MED ORDER — TETANUS-DIPHTH-ACELL PERTUSSIS 5-2.5-18.5 LF-MCG/0.5 IM SUSP: 0.5000 mL | Freq: Once | INTRAMUSCULAR | 0 refills | Status: DC

## 2018-06-18 NOTE — Patient Instructions (Signed)
It was great meeting you today!  I am sorry you had so much trouble with your dizziness.  Think this is likely due to your blood pressure getting too low when you stand up.  I think stopping your metoprolol 50 mg is a good idea.  It looks like this was supposed to be stopped at last visit.  Please help taking this medication, and we will see you back in a few weeks for blood pressure recheck.  I sent a prescription for tetanus vaccine to your pharmacy.

## 2018-06-19 ENCOUNTER — Encounter: Payer: Self-pay | Admitting: Family Medicine

## 2018-06-19 DIAGNOSIS — I951 Orthostatic hypotension: Secondary | ICD-10-CM

## 2018-06-19 HISTORY — DX: Orthostatic hypotension: I95.1

## 2018-06-19 NOTE — Assessment & Plan Note (Signed)
Believe patient's dizziness to be due to postural hypotension.  Unfortunately patient did not stop metoprolol at last visit for unclear reasons.  Very carefully reviewed that she has now to take just the amlodipine and lisinopril.  Patient to follow-up in 3 to 4 weeks for blood pressure recheck.

## 2018-06-19 NOTE — Assessment & Plan Note (Signed)
BP 100/60.  Patient has been taking amlodipine 5 mg, lisinopril 20 mg, metoprolol succinate 50 mg daily.  Given her persistently low pressures and multi-month history of postural hypotension, will stop the metoprolol.  Reviewed history and medication list and has unclear indication for beta-blocker.  No history of A. fib or systolic heart failure could be appreciated. -Amlodipine 5 mg daily -Lisinopril 20 g daily -Stop metoprolol succinate 50 mg -Follow-up in 3 to 4 weeks for blood pressure recheck.

## 2018-06-19 NOTE — Progress Notes (Signed)
   HPI She is 68 year old African-American female who presents for dizziness.  She was previously seen on 05/16/2018 for similar symptoms.  She describes the dizziness as the room spinning.  It occurs mostly when she lays down or when she stands up too quickly.  She does not see stars or doors any double vision.  She was diagnosed with postural hypotension at that appointment.  Seen blood pressure was 102/60 and her orthostatics found a mildly positive.  She is on 3 antihypertensive medications in the form of amlodipine 5 mg, metoprolol 50 mg daily, and lisinopril 20 mg.  She was supposed to stop the metoprolol succinate, but apparently does not happen.  She endorses that she is been taking all three medications daily.  Her symptoms did not improve at all.  He has not taken her blood pressure at home.  CC: Dizziness  ROS:   Review of Systems See HPI for ROS.   CC, SH/smoking status, and VS noted  Objective: BP 100/60   Pulse 97   Temp 98.2 F (36.8 C) (Oral)   Wt 189 lb 8 oz (86 kg)   SpO2 97%   BMI 33.57 kg/m  Gen: Very pleasant 67 year old African-American female, resting comfortably, no acute distress HEENT: No nystagmus, EOMI, PERRLA.  Moist membranes CV: RRR, no murmur Resp: CTAB, no wheezes, non-labored Abd: SNTND, BS present, no guarding or organomegaly Ext: No edema, warm Neuro: Alert and oriented, Speech clear, No gross deficits   Assessment and plan:  Postural hypotension Believe patient's dizziness to be due to postural hypotension.  Unfortunately patient did not stop metoprolol at last visit for unclear reasons.  Very carefully reviewed that she has now to take just the amlodipine and lisinopril.  Patient to follow-up in 3 to 4 weeks for blood pressure recheck.  Essential hypertension BP 100/60.  Patient has been taking amlodipine 5 mg, lisinopril 20 mg, metoprolol succinate 50 mg daily.  Given her persistently low pressures and multi-month history of postural  hypotension, will stop the metoprolol.  Reviewed history and medication list and has unclear indication for beta-blocker.  No history of A. fib or systolic heart failure could be appreciated. -Amlodipine 5 mg daily -Lisinopril 20 g daily -Stop metoprolol succinate 50 mg -Follow-up in 3 to 4 weeks for blood pressure recheck.   No orders of the defined types were placed in this encounter.   Meds ordered this encounter  Medications  . DISCONTD: Tdap (BOOSTRIX) 5-2.5-18.5 LF-MCG/0.5 injection    Sig: Inject 0.5 mLs into the muscle once for 1 dose.    Dispense:  0.5 mL    Refill:  0  . Tdap (BOOSTRIX) 5-2.5-18.5 LF-MCG/0.5 injection    Sig: Inject 0.5 mLs into the muscle once for 1 dose.    Dispense:  0.5 mL    Refill:  0   [name] MD PGY-2 Family Medicine Resident  06/19/2018 4:21 PM

## 2018-07-25 ENCOUNTER — Other Ambulatory Visit: Payer: Self-pay

## 2018-07-25 MED ORDER — MELOXICAM 7.5 MG PO TABS: 7.5000 mg | ORAL_TABLET | Freq: Every day | ORAL | 0 refills | Status: DC | PRN

## 2018-08-20 ENCOUNTER — Other Ambulatory Visit: Payer: Self-pay | Admitting: Family Medicine

## 2018-08-20 DIAGNOSIS — Z1231 Encounter for screening mammogram for malignant neoplasm of breast: Secondary | ICD-10-CM

## 2018-08-25 ENCOUNTER — Other Ambulatory Visit: Payer: Self-pay | Admitting: Internal Medicine

## 2018-08-29 ENCOUNTER — Other Ambulatory Visit: Payer: Self-pay

## 2018-08-29 ENCOUNTER — Other Ambulatory Visit: Payer: Self-pay | Admitting: Family Medicine

## 2018-08-29 ENCOUNTER — Ambulatory Visit (INDEPENDENT_AMBULATORY_CARE_PROVIDER_SITE_OTHER): Payer: 59 | Admitting: Family Medicine

## 2018-08-29 ENCOUNTER — Encounter: Payer: Self-pay | Admitting: Family Medicine

## 2018-08-29 VITALS — BP 110/72 | HR 85

## 2018-08-29 DIAGNOSIS — I951 Orthostatic hypotension: Secondary | ICD-10-CM | POA: Diagnosis not present

## 2018-08-29 NOTE — Patient Instructions (Addendum)
STOP taking amlodipine.   Orthostatic Hypotension Blood pressure is a measurement of how strongly, or weakly, your blood is pressing against the walls of your arteries. Orthostatic hypotension is a sudden drop in blood pressure that happens when you quickly change positions, such as when you get up from sitting or lying down. Arteries are blood vessels that carry blood from your heart throughout your body. When blood pressure is too low, you may not get enough blood to your brain or to the rest of your organs. This can cause weakness, light-headedness, rapid heartbeat, and fainting. This can last for just a few seconds or for up to a few minutes. Orthostatic hypotension is usually not a serious problem. However, if it happens frequently or gets worse, it may be a sign of something more serious. What are the causes? This condition may be caused by:  Sudden changes in posture, such as standing up quickly after you have been sitting or lying down.  Blood loss.  Loss of body fluids (dehydration).  Heart problems.  Hormone (endocrine) problems.  Pregnancy.  Severe infection.  Lack of certain nutrients.  Severe allergic reactions (anaphylaxis).  Certain medicines, such as blood pressure medicine or medicines that make the body lose excess fluids (diuretics). Sometimes, this condition can be caused by not taking medicine as directed, such as taking too much of a certain medicine. What increases the risk? The following factors may make you more likely to develop this condition:  Age. Risk increases as you get older.  Conditions that affect the heart or the central nervous system.  Taking certain medicines, such as blood pressure medicine or diuretics.  Being pregnant. What are the signs or symptoms? Symptoms of this condition may include:  Weakness.  Light-headedness.  Dizziness.  Blurred vision.  Fatigue.  Rapid heartbeat.  Fainting, in severe cases. How is this  diagnosed? This condition is diagnosed based on:  Your medical history.  Your symptoms.  Your blood pressure measurement. Your health care provider will check your blood pressure when you are: ? Lying down. ? Sitting. ? Standing. A blood pressure reading is recorded as two numbers, such as "120 over 80" (or 120/80). The first ("top") number is called the systolic pressure. It is a measure of the pressure in your arteries as your heart beats. The second ("bottom") number is called the diastolic pressure. It is a measure of the pressure in your arteries when your heart relaxes between beats. Blood pressure is measured in a unit called mm Hg. Healthy blood pressure for most adults is 120/80. If your blood pressure is below 90/60, you may be diagnosed with hypotension. Other information or tests that may be used to diagnose orthostatic hypotension include:  Your other vital signs, such as your heart rate and temperature.  Blood tests.  Tilt table test. For this test, you will be safely secured to a table that moves you from a lying position to an upright position. Your heart rhythm and blood pressure will be monitored during the test. How is this treated? This condition may be treated by:  Changing your diet. This may involve eating more salt (sodium) or drinking more water.  Taking medicines to raise your blood pressure.  Changing the dosage of certain medicines you are taking that might be lowering your blood pressure.  Wearing compression stockings. These stockings help to prevent blood clots and reduce swelling in your legs. In some cases, you may need to go to the hospital for:  Fluid  replacement. This means you will receive fluids through an IV.  Blood replacement. This means you will receive donated blood through an IV (transfusion).  Treating an infection or heart problems, if this applies.  Monitoring. You may need to be monitored while medicines that you are taking wear  off. Follow these instructions at home: Eating and drinking   Drink enough fluid to keep your urine pale yellow.  Eat a healthy diet, and follow instructions from your health care provider about eating or drinking restrictions. A healthy diet includes: ? Fresh fruits and vegetables. ? Whole grains. ? Lean meats. ? Low-fat dairy products.  Eat extra salt only as directed. Do not add extra salt to your diet unless your health care provider told you to do that.  Eat frequent, small meals.  Avoid standing up suddenly after eating. Medicines  Take over-the-counter and prescription medicines only as told by your health care provider. ? Follow instructions from your health care provider about changing the dosage of your current medicines, if this applies. ? Do not stop or adjust any of your medicines on your own. General instructions   Wear compression stockings as told by your health care provider.  Get up slowly from lying down or sitting positions. This gives your blood pressure a chance to adjust.  Avoid hot showers and excessive heat as directed by your health care provider.  Return to your normal activities as told by your health care provider. Ask your health care provider what activities are safe for you.  Do not use any products that contain nicotine or tobacco, such as cigarettes, e-cigarettes, and chewing tobacco. If you need help quitting, ask your health care provider.  Keep all follow-up visits as told by your health care provider. This is important. Contact a health care provider if you:  Vomit.  Have diarrhea.  Have a fever for more than 2-3 days.  Feel more thirsty than usual.  Feel weak and tired. Get help right away if you:  Have chest pain.  Have a fast or irregular heartbeat.  Develop numbness in any part of your body.  Cannot move your arms or your legs.  Have trouble speaking.  Become sweaty or feel light-headed.  Faint.  Feel short of  breath.  Have trouble staying awake.  Feel confused. Summary  Orthostatic hypotension is a sudden drop in blood pressure that happens when you quickly change positions.  Orthostatic hypotension is usually not a serious problem.  It is diagnosed by having your blood pressure taken lying down, sitting, and then standing.  It may be treated by changing your diet or adjusting your medicines. This information is not intended to replace advice given to you by your health care provider. Make sure you discuss any questions you have with your health care provider. Document Released: 03/30/2002 Document Revised: 10/03/2017 Document Reviewed: 10/03/2017 Elsevier Interactive Patient Education  [employer].

## 2018-08-29 NOTE — Progress Notes (Signed)
Acute Office Visit  Subjective:    Patient ID: Wendy Heath, female    DOB: 02/21/1951, 68 y.o.   MRN: [id number]  Chief Complaint  Patient presents with  . Dizziness    Patient is in today for dizziness. She has been dealing with this for 04/2018. She was taken off of her metoprolol at last visit. She remains normotensive, but orthostatics today are positive from lying to standing. (HR not obtained).  Denies chest pain, LOC, palpitations, diplopia, falls.   Orthostatic VS for the past 24 hrs (Last 3 readings):  BP- Lying BP- Sitting BP- Standing at 0 minutes  08/29/18 1404 (!) 138/100 (!) 120/98 116/90     Past Medical History:  Diagnosis Date  . Blind right eye 07/04/2011   Since stroke 2003  . CEREBROVASCULAR ACCIDENT, HX OF 08/24/2006  . Diverticulitis   . GERD 08/24/2006  . HYPERLIPIDEMIA 08/24/2006  . HYPERTENSION 08/24/2006  . Impaired glucose tolerance 07/04/2011  . Malignant neoplasm of breast (female), unspecified site 03/13/2011  . Personal history of chemotherapy   . Personal history of radiation therapy 12/26/2006  . Stroke [facility]) 2005    Past Surgical History:  Procedure Laterality Date  . ABDOMINAL HYSTERECTOMY  1995  . BREAST BIOPSY  06/10/2006   malignant  . BREAST LUMPECTOMY Left 11/25/2006   malignant  . COLONOSCOPY N/A 02/05/2013   Procedure: COLONOSCOPY;  Surgeon: Beryle Beams, MD;  Location: [facility];  Service: Endoscopy;  Laterality: N/A;    Family History  Problem Relation Age of Onset  . Stroke Other   . Cancer Other        breast cancer  . Cancer Other        breast cancer  . Stroke Other   . Hypertension Other   . Diabetes Other   . Sudden death Other   . Breast cancer Mother   . Heart disease Father   . Breast cancer Sister     Social History   Socioeconomic History  . Marital status: Divorced    Spouse name: Not on file  . Number of children: Not on file  . Years of education: 65  . Highest education level: Not on file   Occupational History    Employer: UNEMPLOYED  Social Needs  . Financial resource strain: Not on file  . Food insecurity:    Worry: Not on file    Inability: Not on file  . Transportation needs:    Medical: Not on file    Non-medical: Not on file  Tobacco Use  . Smoking status: Current Every Day Smoker    Packs/day: 0.25    Years: 50.00    Pack years: 12.50    Types: Cigarettes  . Smokeless tobacco: Never Used  Substance and Sexual Activity  . Alcohol use: Yes    Comment: Once a week.   . Drug use: No  . Sexual activity: Not on file  Lifestyle  . Physical activity:    Days per week: Not on file    Minutes per session: Not on file  . Stress: Not on file  Relationships  . Social connections:    Talks on phone: Not on file    Gets together: Not on file    Attends religious service: Not on file    Active member of club or organization: Not on file    Attends meetings of clubs or organizations: Not on file    Relationship status: Not on file  .  Intimate partner violence:    Fear of current or ex partner: Not on file    Emotionally abused: Not on file    Physically abused: Not on file    Forced sexual activity: Not on file  Other Topics Concern  . Not on file  Social History Narrative  . Not on file    Outpatient Medications Prior to Visit  Medication Sig Dispense Refill  . aspirin 81 MG tablet Take 81 mg by mouth daily.     . cetirizine (ZYRTEC) 10 MG tablet Take 1 tablet (10 mg total) by mouth daily. 90 tablet 0  . cholecalciferol (VITAMIN D) 400 UNITS TABS Take 400 Units by mouth daily.     . clopidogrel (PLAVIX) 75 MG tablet TAKE 1 TABLET DAILY (DO NOT TAKE FOR 1 MORE WEEK, THEN RESUME) 90 tablet 2  . diclofenac sodium (VOLTAREN) 1 % GEL APPLY 2 GM TOPICALLY 4 (FOUR) TIMES DAILY as needed 100 g 0  . famotidine (PEPCID) 40 MG tablet Take 1 tablet (40 mg total) by mouth daily. 90 tablet 3  . fluticasone (FLONASE) 50 MCG/ACT nasal spray Place 2 sprays into both  nostrils daily. 16 g 6  . gabapentin (NEURONTIN) 400 MG capsule Take 1 capsule (400 mg total) by mouth 3 (three) times daily. 270 capsule 0  . lisinopril (PRINIVIL,ZESTRIL) 20 MG tablet Take 1 tablet (20 mg total) by mouth daily. 90 tablet 3  . meloxicam (MOBIC) 7.5 MG tablet Take 1 tablet (7.5 mg total) by mouth daily as needed for pain. 30 tablet 0  . polyethylene glycol (MIRALAX / GLYCOLAX) packet Take 17 g by mouth daily. While on the pain medicine 14 each 0  . potassium chloride (MICRO-K) 10 MEQ CR capsule Take 1 capsule (10 mEq total) by mouth daily. 30 capsule 0  . rosuvastatin (CRESTOR) 10 MG tablet Take 1 tablet (10 mg total) by mouth daily. 90 tablet 3  . amLODipine (NORVASC) 5 MG tablet TAKE 1 TABLET EVERY DAY 90 tablet 2   No facility-administered medications prior to visit.     No Known Allergies  Review of Systems  All other systems reviewed and are negative. As per HPI     Objective:    Physical Exam  Constitutional: She is oriented to person, place, and time. She appears well-developed and well-nourished.  HENT:  Head: Normocephalic and atraumatic.  Eyes: No scleral icterus.  Blind in right eye  Neck: No JVD present.  Cardiovascular: Normal rate and regular rhythm.  Pulmonary/Chest: Effort normal and breath sounds normal.  Musculoskeletal: Normal range of motion.        General: No deformity or edema.  Neurological: She is alert and oriented to person, place, and time. No cranial nerve deficit. She exhibits normal muscle tone. Coordination normal.  Skin: Skin is warm and dry. No rash noted. No erythema.  Psychiatric: She has a normal mood and affect. Her behavior is normal.    BP 110/72   Pulse 85   SpO2 99%  Wt Readings from Last 3 Encounters:  06/18/18 189 lb 8 oz (86 kg)  05/16/18 187 lb 2 oz (84.9 kg)  04/01/18 193 lb (87.5 kg)    Health Maintenance Due  Topic Date Due  . OPHTHALMOLOGY EXAM  11/23/1958  . TETANUS/TDAP  11/22/2014  . COLONOSCOPY   02/05/2018  . FOOT EXAM  03/27/2018    There are no preventive care reminders to display for this patient.   Lab Results  Component Value Date  TSH 0.74 05/18/2016   Lab Results  Component Value Date   WBC 6.1 03/26/2016   HGB 13.8 03/26/2016   HCT 42.6 03/26/2016   MCV 94.2 03/26/2016   PLT 202 03/26/2016   Lab Results  Component Value Date   NA 142 09/25/2017   K 3.9 09/25/2017   CHLORIDE 99 03/26/2016   CO2 27 09/25/2017   GLUCOSE 93 09/25/2017   BUN 15 09/25/2017   CREATININE 0.93 09/25/2017   BILITOT 0.43 03/26/2016   ALKPHOS 86 03/26/2016   AST 16 03/26/2016   ALT 10 03/26/2016   PROT 7.3 03/26/2016   ALBUMIN 3.5 03/26/2016   CALCIUM 9.8 09/25/2017   ANIONGAP 11 03/26/2016   EGFR 54 (L) 03/26/2016   GFR 90.37 07/02/2012   Lab Results  Component Value Date   CHOL 137 03/27/2017   Lab Results  Component Value Date   HDL 48 03/27/2017   Lab Results  Component Value Date   LDLCALC 58 03/27/2017   Lab Results  Component Value Date   TRIG 154 (H) 03/27/2017   Lab Results  Component Value Date   CHOLHDL 2.9 03/27/2017   Lab Results  Component Value Date   HGBA1C 5.7 04/01/2018       Assessment & Plan:   Problem List Items Addressed This Visit      Cardiovascular and Mediastinum   Postural hypotension - Primary    Postural hypotension as noted on orthostatics. Minimal improvement since stopping metoprolol. Pt also on CCB which could cause this.  - stop amlodipine - f/u orthostatics/BP in 2 weeks          No orders of the defined types were placed in this encounter.    Bonnita Hollow, MD

## 2018-08-29 NOTE — Assessment & Plan Note (Signed)
Postural hypotension as noted on orthostatics. Minimal improvement since stopping metoprolol. Pt also on CCB which could cause this.  - stop amlodipine - f/u orthostatics/BP in 2 weeks

## 2018-08-30 ENCOUNTER — Other Ambulatory Visit: Payer: Self-pay | Admitting: Internal Medicine

## 2018-09-05 ENCOUNTER — Other Ambulatory Visit: Payer: Self-pay | Admitting: Internal Medicine

## 2018-09-09 ENCOUNTER — Ambulatory Visit: Payer: 59 | Admitting: Family Medicine
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
## 2018-09-10 ENCOUNTER — Other Ambulatory Visit: Payer: Self-pay | Admitting: Internal Medicine

## 2018-09-17 ENCOUNTER — Other Ambulatory Visit: Payer: Self-pay | Admitting: Family Medicine

## 2018-09-18 NOTE — Progress Notes (Deleted)
  Subjective:   Patient ID: Wendy Heath    DOB: Dec 31, 1950, 68 y.o. female   MRN: [id number]  Wendy Heath is a 68 y.o. female with a history of HTN, GERD, T2DM, HLD, obesity, allergies here for   Dizziness  - Previously seen for same 2/26 and 5/8 with postural hypotension and had amlodipine and metoprolol d/ced. Continues on lisinopril. - Denies chest pain, LOC, palpitations, recent vision changes, falls***  ***due for tetanus ***metformin?  Review of Systems:  Per HPI.  [place], medications and smoking status reviewed.  Objective:   There were no vitals taken for this visit. Vitals and nursing note reviewed.  General: well nourished, well developed, in no acute distress with non-toxic appearance HEENT: normocephalic, atraumatic, moist mucous membranes Neck: supple, non-tender without lymphadenopathy CV: regular rate and rhythm without murmurs, rubs, or gallops, no lower extremity edema Lungs: clear to auscultation bilaterally with normal work of breathing Abdomen: soft, non-tender, non-distended, no masses or organomegaly palpable, normoactive bowel sounds Skin: warm, dry, no rashes or lesions Extremities: warm and well perfused, normal tone MSK: ROM grossly intact, strength intact, gait normal Neuro: Alert and oriented, speech normal  Assessment & Plan:   No problem-specific Assessment & Plan notes found for this encounter.  No orders of the defined types were placed in this encounter.  No orders of the defined types were placed in this encounter.   Rory Percy, DO PGY-2, [facility] 09/18/2018 2:28 PM

## 2018-09-19 ENCOUNTER — Ambulatory Visit: Payer: 59 | Admitting: Family Medicine

## 2018-09-19 ENCOUNTER — Ambulatory Visit: Payer: Medicare HMO | Admitting: Family Medicine

## 2018-10-01 ENCOUNTER — Other Ambulatory Visit: Payer: Self-pay

## 2018-10-01 ENCOUNTER — Ambulatory Visit (INDEPENDENT_AMBULATORY_CARE_PROVIDER_SITE_OTHER): Payer: Medicare HMO | Admitting: Family Medicine

## 2018-10-01 ENCOUNTER — Encounter: Payer: Self-pay | Admitting: Family Medicine

## 2018-10-01 VITALS — BP 130/90 | HR 87 | Ht 63.0 in

## 2018-10-01 DIAGNOSIS — M79602 Pain in left arm: Secondary | ICD-10-CM

## 2018-10-01 DIAGNOSIS — R42 Dizziness and giddiness: Secondary | ICD-10-CM | POA: Diagnosis not present

## 2018-10-01 DIAGNOSIS — E119 Type 2 diabetes mellitus without complications: Secondary | ICD-10-CM | POA: Diagnosis not present

## 2018-10-01 DIAGNOSIS — I10 Essential (primary) hypertension: Secondary | ICD-10-CM | POA: Diagnosis not present

## 2018-10-01 LAB — POCT GLYCOSYLATED HEMOGLOBIN (HGB A1C): HbA1c, POC (controlled diabetic range): 6 % (ref 0.0–7.0)

## 2018-10-01 MED ORDER — MECLIZINE HCL 12.5 MG PO TABS: 12.5000 mg | ORAL_TABLET | Freq: Three times a day (TID) | ORAL | 0 refills | Status: AC | PRN

## 2018-10-01 MED ORDER — DICLOFENAC SODIUM 1 % TD GEL: TRANSDERMAL | 1 refills | Status: DC

## 2018-10-01 NOTE — Progress Notes (Signed)
  Subjective:   Patient ID: Wendy Heath    DOB: 1951/01/20, 68 y.o. female   MRN: [id number]  Wendy Heath is a 68 y.o. female with a history of HTN, GERD, T2DM, HLD, obesity, h/o CVA, h/o breast cancer here for   DIZZINESS  Has been seen previously for orthostatic hypotension on 1/24, 2/26, 5/8 with resultant d/c of some antihypertensives. On HCTZ 12.5 and lisinopril 20mg . States this has been ongoing for about 6 months. Thinks it may be due to discontinuing BP meds.  Dizziness described as:  Lightheadedness and room spinning, exacerbated by positional changes. Feels like room spins: yes  Lightheadedness when stands: yes Palpitations or heart racing: no Medications tried: none  Taking blood thinners: ASA, plavix since stroke early 2000s  Symptoms Hearing Loss: no Ear Pain or fullness: no Nausea or vomiting: no Vision difficulty or double vision: no Falls: no Head trauma: no Weakness in arm or leg: no Speaking problems: no Headache: no  Review of Systems:  Per HPI.  [place], medications and smoking status reviewed.  Objective:   BP 130/90   Pulse 87   Ht 5\' 3"  (1.6 m)   SpO2 98%   BMI 33.57 kg/m  Vitals and nursing note reviewed.  General: well nourished, well developed, in no acute distress with non-toxic appearance HEENT: normocephalic, atraumatic, moist mucous membranes Neck: supple, non-tender without lymphadenopathy CV: regular rate and rhythm without murmurs, rubs, or gallops, no lower extremity edema Lungs: clear to auscultation bilaterally with normal work of breathing Abdomen: soft, non-tender, non-distended, no masses or organomegaly palpable, normoactive bowel sounds Skin: warm, dry, no rashes or lesions Extremities: warm and well perfused, normal tone MSK: Full ROM, strength 5/5 to U/LE bilaterally, deep reflexes intact, normal gait.  No edema.  Neuro: Alert and oriented, speech normal. PERRL, Extraocular movements intact. Hearing grossly intact  bilaterally.  Tongue protrudes normally with no deviation.  Shoulder shrug, smile symmetric. Finger to nose normal. Marye Round attempted but unable to complete due to exacerbated dizziness.  Assessment & Plan:   Dizziness Persistent, described as room spinning. Previously with orthostatic hypotension with resultant decrease in BP regimen. Orthostatics negative today. Attempted Dix hallpike but unable to complete due to exacerbated dizziness. Will trial meclizine and refer to vestibular PT. Will also check CBC given patient is on blood thinners, no h/o falls or head trauma.  Essential hypertension At goal without +orthostatics today. No changes made.  Orders Placed This Encounter  Procedures  . CBC  . Ambulatory referral to Physical Therapy    Referral Priority:   Routine    Referral Type:   Physical Medicine    Referral Reason:   Specialty Services Required    Requested Specialty:   Physical Therapy    Number of Visits Requested:   1   Meds ordered this encounter  Medications  . diclofenac sodium (VOLTAREN) 1 % GEL    Sig: APPLY 2 GM TOPICALLY 4 (FOUR) TIMES DAILY as needed    Dispense:  100 g    Refill:  1  . meclizine (ANTIVERT) 12.5 MG tablet    Sig: Take 1 tablet (12.5 mg total) by mouth 3 (three) times daily as needed for up to 10 days for dizziness.    Dispense:  30 tablet    Refill:  0    Rory Percy, DO PGY-2, [facility] 10/01/2018 12:35 PM

## 2018-10-01 NOTE — Addendum Note (Signed)
Addended by: Myles Gip on: 10/01/2018 01:20 PM   Modules accepted: Orders

## 2018-10-01 NOTE — Assessment & Plan Note (Signed)
At goal without +orthostatics today. No changes made.

## 2018-10-01 NOTE — Patient Instructions (Signed)
It was great to see you!  Our plans for today:  - We are sending meclizine to your pharmacy for your vertigo. - We are also sending you to Physical Therapy for vestibular rehab to help with your vertigo. - We are checking some labs today, we will call you or send you a letter with these results. - No changes to your blood pressure medications today.  Take care and seek immediate care sooner if you develop any concerns.   Dr. [facility]   Vertigo  Vertigo means that you feel like you are moving when you are not. Vertigo can also make you feel like things around you are moving when they are not. This feeling can come and go at any time. Vertigo often goes away on its own. Follow these instructions at home:  Avoid making fast movements.  Avoid driving.  Avoid using heavy machinery.  Avoid doing any task or activity that might cause danger to you or other people if you would have a vertigo attack while you are doing it.  Sit down right away if you feel dizzy or have trouble with your balance.  Take over-the-counter and prescription medicines only as told by your doctor.  Follow instructions from your doctor about which positions or movements you should avoid.  Drink enough fluid to keep your pee (urine) clear or pale yellow.  Keep all follow-up visits as told by your doctor. This is important. Contact a doctor if:  Medicine does not help your vertigo.  You have a fever.  Your problems get worse or you have new symptoms.  Your family or friends see changes in your behavior.  You feel sick to your stomach (nauseous) or you throw up (vomit).  You have a "pins and needles" feeling or you are numb in part of your body. Get help right away if:  You have trouble moving or talking.  You are always dizzy.  You pass out (faint).  You get very bad headaches.  You feel weak or have trouble using your hands, arms, or legs.  You have changes in your  hearing.  You have changes in your seeing (vision).  You get a stiff neck.  Bright light starts to bother you. This information is not intended to replace advice given to you by your health care provider. Make sure you discuss any questions you have with your health care provider. Document Released: 01/17/2008 Document Revised: 09/15/2015 Document Reviewed: 08/02/2014 Elsevier Interactive Patient Education  [employer].

## 2018-10-01 NOTE — Assessment & Plan Note (Signed)
Persistent, described as room spinning. Previously with orthostatic hypotension with resultant decrease in BP regimen. Orthostatics negative today. Attempted Dix hallpike but unable to complete due to exacerbated dizziness. Will trial meclizine and refer to vestibular PT. Will also check CBC given patient is on blood thinners, no h/o falls or head trauma.

## 2018-10-02 LAB — CBC
Hematocrit: 41.2 % (ref 34.0–46.6)
Hemoglobin: 13.3 g/dL (ref 11.1–15.9)
MCH: 29.1 pg (ref 26.6–33.0)
MCHC: 32.3 g/dL (ref 31.5–35.7)
MCV: 90 fL (ref 79–97)
Platelets: 242 10*3/uL (ref 150–450)
RBC: 4.57 x10E6/uL (ref 3.77–5.28)
RDW: 14.4 % (ref 11.7–15.4)
WBC: 7.1 10*3/uL (ref 3.4–10.8)

## 2018-10-13 ENCOUNTER — Telehealth (INDEPENDENT_AMBULATORY_CARE_PROVIDER_SITE_OTHER): Payer: Medicare HMO | Admitting: Student in an Organized Health Care Education/Training Program

## 2018-10-13 ENCOUNTER — Other Ambulatory Visit: Payer: Self-pay

## 2018-10-13 DIAGNOSIS — R42 Dizziness and giddiness: Secondary | ICD-10-CM | POA: Diagnosis not present

## 2018-10-13 NOTE — Assessment & Plan Note (Signed)
Symptoms consistent with vertigo. Counseling provided regarding benefit of neurovestibular rehab. Neuro rehab is backed up from COVID-19 closure. Patient was advised to call the rehab center directly to schedule an appointment.  The phone number and address were provided. The patient expressed understanding and is in agreement with this plan.

## 2018-10-13 NOTE — Progress Notes (Signed)
[place] Telemedicine Visit  Patient consented to have virtual visit. Method of visit: Telephone  Encounter participants: Patient: Wendy Heath - located at home Provider: Everrett Coombe - located at [facility] Others (if applicable): none  Chief Complaint: vertigo  HPI: Patient was seen and evaluated in person in the office on June 10.  She was diagnosed with vertigo at that time.  She was referred to vestibular rehab and prescribed meclizine.  She reports that she never heard from the vestibular rehab office.  She reports that the meclizine is not resolving her symptoms.  Today, she reports that she has the sensation that the room is spinning when she turns her head to the right side or when she looks straight upward.  This sensation also occurs when she lies on her right side in bed.  When this sensation comes on, she sits up immediately and the symptom resolves after several minutes.  She has not had any nausea or vomiting.  She is not feeling dizzy right at this moment but she does not feel completely herself.  No chest pain, dizziness, or diaphoresis.  ROS: per HPI  Pertinent PMHx: GERD, hypertension, history of CVA, hyperlipidemia, hypokalemia  Exam:  Respiratory: Speaks in full sentences  Assessment/Plan:  Dizziness Symptoms consistent with vertigo. Counseling provided regarding benefit of neurovestibular rehab. Neuro rehab is backed up from COVID-19 closure. Patient was advised to call the rehab center directly to schedule an appointment.  The phone number and address were provided. The patient expressed understanding and is in agreement with this plan.    Time spent during visit with patient: 7 minutes

## 2018-10-14 ENCOUNTER — Other Ambulatory Visit: Payer: Self-pay

## 2018-10-14 ENCOUNTER — Ambulatory Visit: Payer: Medicare HMO | Attending: Family Medicine | Admitting: Physical Therapy

## 2018-10-14 VITALS — BP 117/82 | HR 96

## 2018-10-14 DIAGNOSIS — H8111 Benign paroxysmal vertigo, right ear: Secondary | ICD-10-CM | POA: Insufficient documentation

## 2018-10-14 NOTE — Patient Instructions (Addendum)
How to Perform the Epley Maneuver The Epley maneuver is an exercise that relieves symptoms of vertigo. Vertigo is the feeling that you or your surroundings are moving when they are not. When you feel vertigo, you may feel like the room is spinning and have trouble walking. Dizziness is a little different than vertigo. When you are dizzy, you may feel unsteady or light-headed. You can do this maneuver at home whenever you have symptoms of vertigo. You can do it up to 3 times a day until your symptoms go away. Even though the Epley maneuver may relieve your vertigo for a few weeks, it is possible that your symptoms will return. This maneuver relieves vertigo, but it does not relieve dizziness. What are the risks? If it is done correctly, the Epley maneuver is considered safe. Sometimes it can lead to dizziness or nausea that goes away after a short time. If you develop other symptoms, such as changes in vision, weakness, or numbness, stop doing the maneuver and call your health care provider. How to perform the Epley maneuver 1. Sit on the edge of a bed or table with your back straight and your legs extended or hanging over the edge of the bed or table. 2. Turn your head halfway toward the affected ear or side. 3. Lie backward quickly with your head turned until you are lying flat on your back. You may want to position a pillow under your shoulders. 4. Hold this position for 30 seconds. You may experience an attack of vertigo. This is normal. 5. Turn your head to the opposite direction until your unaffected ear is facing the floor. 6. Hold this position for 30 seconds. You may experience an attack of vertigo. This is normal. Hold this position until the vertigo stops. 7. Turn your whole body to the same side as your head. Hold for another 30 seconds. 8. Sit back up. You can repeat this exercise up to 3 times a day. Follow these instructions at home:  After doing the Epley maneuver, you can return to  your normal activities.  Ask your health care provider if there is anything you should do at home to prevent vertigo. He or she may recommend that you: ? Keep your head raised (elevated) with two or more pillows while you sleep. ? Do not sleep on the side of your affected ear. ? Get up slowly from bed. ? Avoid sudden movements during the day. ? Avoid extreme head movement, like looking up or bending over. Contact a health care provider if:  Your vertigo gets worse.  You have other symptoms, including: ? Nausea. ? Vomiting. ? Headache. Get help right away if:  You have vision changes.  You have a severe or worsening headache or neck pain.  You cannot stop vomiting.  You have new numbness or weakness in any part of your body. Summary  Vertigo is the feeling that you or your surroundings are moving when they are not.  The Epley maneuver is an exercise that relieves symptoms of vertigo.  If the Epley maneuver is done correctly, it is considered safe. You can do it up to 3 times a day. This information is not intended to replace advice given to you by your health care provider. Make sure you discuss any questions you have with your health care provider. Document Released: 04/14/2013 Document Revised: 02/28/2016 Document Reviewed: 02/28/2016 Elsevier Interactive Patient Education  2019 Else[employer] Inc[employer]    Benign Positional Vertigo Vertigo is the feeling that  you or your surroundings are moving when they are not. Benign positional vertigo is the most common form of vertigo. This is usually a harmless condition (benign). This condition is positional. This means that symptoms are triggered by certain movements and positions. This condition can be dangerous if it occurs while you are doing something that could cause harm to you or others. This includes activities such as driving or operating machinery. What are the causes? In many cases, the cause of this condition is not known. It may  be caused by a disturbance in an area of the inner ear that helps your brain to sense movement and balance. This disturbance can be caused by:  Viral infection (labyrinthitis).  Head injury.  Repetitive motion, such as jumping, dancing, or running. What increases the risk? You are more likely to develop this condition if:  You are a woman.  You are 75 years of age or older. What are the signs or symptoms? Symptoms of this condition usually happen when you move your head or your eyes in different directions. Symptoms may start suddenly, and usually last for less than a minute. They include:  Loss of balance and falling.  Feeling like you are spinning or moving.  Feeling like your surroundings are spinning or moving.  Nausea and vomiting.  Blurred vision.  Dizziness.  Involuntary eye movement (nystagmus). Symptoms can be mild and cause only minor problems, or they can be severe and interfere with daily life. Episodes of benign positional vertigo may return (recur) over time. Symptoms may improve over time. How is this diagnosed? This condition may be diagnosed based on:  Your medical history.  Physical exam of the head, neck, and ears.  Tests, such as: ? MRI. ? CT scan. ? Eye movement tests. Your health care provider may ask you to change positions quickly while he or she watches you for symptoms of benign positional vertigo, such as nystagmus. Eye movement may be tested with a variety of exams that are designed to evaluate or stimulate vertigo. ? An electroencephalogram (EEG). This records electrical activity in your brain. ? Hearing tests. You may be referred to a health care provider who specializes in ear, nose, and throat (ENT) problems (otolaryngologist) or a provider who specializes in disorders of the nervous system (neurologist). How is this treated?  This condition may be treated in a session in which your health care provider moves your head in specific  positions to adjust your inner ear back to normal. Treatment for this condition may take several sessions. Surgery may be needed in severe cases, but this is rare. In some cases, benign positional vertigo may resolve on its own in 2-4 weeks. Follow these instructions at home: Safety  Move slowly. Avoid sudden body or head movements or certain positions, as told by your health care provider.  Avoid driving until your health care provider says it is safe for you to do so.  Avoid operating heavy machinery until your health care provider says it is safe for you to do so.  Avoid doing any tasks that would be dangerous to you or others if vertigo occurs.  If you have trouble walking or keeping your balance, try using a cane for stability. If you feel dizzy or unstable, sit down right away.  Return to your normal activities as told by your health care provider. Ask your health care provider what activities are safe for you. General instructions  Take over-the-counter and prescription medicines only as told by  your health care provider.  Drink enough fluid to keep your urine pale yellow.  Keep all follow-up visits as told by your health care provider. This is important. Contact a health care provider if:  You have a fever.  Your condition gets worse or you develop new symptoms.  Your family or friends notice any behavioral changes.  You have nausea or vomiting that gets worse.  You have numbness or a "pins and needles" sensation. Get help right away if you:  Have difficulty speaking or moving.  Are always dizzy.  Faint.  Develop severe headaches.  Have weakness in your legs or arms.  Have changes in your hearing or vision.  Develop a stiff neck.  Develop sensitivity to light. Summary  Vertigo is the feeling that you or your surroundings are moving when they are not. Benign positional vertigo is the most common form of vertigo.  The cause of this condition is not known.  It may be caused by a disturbance in an area of the inner ear that helps your brain to sense movement and balance.  Symptoms include loss of balance and falling, feeling that you or your surroundings are moving, nausea and vomiting, and blurred vision.  This condition can be diagnosed based on symptoms, physical exam, and other tests, such as MRI, CT scan, eye movement tests, and hearing tests.  Follow safety instructions as told by your health care provider. You will also be told when to contact your health care provider in case of problems. This information is not intended to replace advice given to you by your health care provider. Make sure you discuss any questions you have with your health care provider. Document Released: 01/15/2006 Document Revised: 09/18/2017 Document Reviewed: 09/18/2017 Elsevier Interactive Patient Education  2019 [employer]. How to Perform the Epley Maneuver The Epley maneuver is an exercise that relieves symptoms of vertigo. Vertigo is the feeling that you or your surroundings are moving when they are not. When you feel vertigo, you may feel like the room is spinning and have trouble walking. Dizziness is a little different than vertigo. When you are dizzy, you may feel unsteady or light-headed. You can do this maneuver at home whenever you have symptoms of vertigo. You can do it up to 3 times a day until your symptoms go away. Even though the Epley maneuver may relieve your vertigo for a few weeks, it is possible that your symptoms will return. This maneuver relieves vertigo, but it does not relieve dizziness. What are the risks? If it is done correctly, the Epley maneuver is considered safe. Sometimes it can lead to dizziness or nausea that goes away after a short time. If you develop other symptoms, such as changes in vision, weakness, or numbness, stop doing the maneuver and call your health care provider. How to perform the Epley maneuver 9. Sit on the edge of a  bed or table with your back straight and your legs extended or hanging over the edge of the bed or table. 10. Turn your head halfway toward the affected ear or side. 11. Lie backward quickly with your head turned until you are lying flat on your back. You may want to position a pillow under your shoulders. 12. Hold this position for 30 seconds. You may experience an attack of vertigo. This is normal. 13. Turn your head to the opposite direction until your unaffected ear is facing the floor. 14. Hold this position for 30 seconds. You may experience an attack  of vertigo. This is normal. Hold this position until the vertigo stops. 15. Turn your whole body to the same side as your head. Hold for another 30 seconds. 16. Sit back up. You can repeat this exercise up to 3 times a day. Follow these instructions at home:  After doing the Epley maneuver, you can return to your normal activities.  Ask your health care provider if there is anything you should do at home to prevent vertigo. He or she may recommend that you: ? Keep your head raised (elevated) with two or more pillows while you sleep. ? Do not sleep on the side of your affected ear. ? Get up slowly from bed. ? Avoid sudden movements during the day. ? Avoid extreme head movement, like looking up or bending over. Contact a health care provider if:  Your vertigo gets worse.  You have other symptoms, including: ? Nausea. ? Vomiting. ? Headache. Get help right away if:  You have vision changes.  You have a severe or worsening headache or neck pain.  You cannot stop vomiting.  You have new numbness or weakness in any part of your body. Summary  Vertigo is the feeling that you or your surroundings are moving when they are not.  The Epley maneuver is an exercise that relieves symptoms of vertigo.  If the Epley maneuver is done correctly, it is considered safe. You can do it up to 3 times a day. This information is not intended to  replace advice given to you by your health care provider. Make sure you discuss any questions you have with your health care provider. Document Released: 04/14/2013 Document Revised: 02/28/2016 Document Reviewed: 02/28/2016 Elsevier Interactive Patient Education  2019 [place] for Right Posterior / Anterior Canalithiasis    Sitting on bed: 1. Turn head 45 right. (a) Lie back slowly, shoulders on pillow, head on bed. (b) Hold _30___ seconds. 2. Keeping head on bed, turn head 90 left. Hold _30___ seconds. 3. Roll to left, head on 45 angle down toward bed. Hold _30___ seconds. 4. Sit up on left side of bed. Repeat _3___ times per session. Do _2-3___ sessions per day.  Copyright  VHI. All rights reserved.

## 2018-10-15 ENCOUNTER — Telehealth (INDEPENDENT_AMBULATORY_CARE_PROVIDER_SITE_OTHER): Payer: Medicare HMO | Admitting: Family Medicine

## 2018-10-15 ENCOUNTER — Ambulatory Visit: Payer: Medicare HMO

## 2018-10-15 DIAGNOSIS — R42 Dizziness and giddiness: Secondary | ICD-10-CM | POA: Diagnosis not present

## 2018-10-15 NOTE — Progress Notes (Signed)
[place] Telemedicine Visit  Patient consented to have virtual visit. Method of visit: Telephone  Encounter participants: Patient: Wendy Heath - located at home Provider: Rory Percy - located at [facility] Others (if applicable): N/A  Chief Complaint: dizziness is better  HPI:  Patient calls today for follow-up for dizziness.  She went to vestibular rehab yesterday for vertigo symptoms and felt much better after manipulation.  She continues on meclizine and has 2 days left but states she did not notice much relief with meclizine prior to vestibular rehab.  She wants to know if she should continue the meclizine.  She has not been taking her blood pressure measurements at home she does not have a cuff.  Her dizziness is completely resolved.  She denies any nausea, vomiting, falls.  She denies any issues taking meclizine.  ROS: per HPI  Pertinent PMHx: HTN, obesity, HLD, h/o CVA, allergies, T2DM, GERD  Exam:  Respiratory: Speaks in full sentences, no respiratory distress  Assessment/Plan:  Dizziness Secondary to vertigo, resolved after vestibular rehab.  Instructed to continue on meclizine for 2 more days to finish out course given she is not having any adverse effects to medication.  Directed to check blood pressure whenever she goes to the drugstore for ongoing monitoring.  Advised to call if symptoms return.  Follow-up in 3-6 months for blood pressure and diabetes.   Time spent during visit with patient: 6 minutes

## 2018-10-15 NOTE — Therapy (Signed)
[facility] [address] [place], [place], [address] Phone: [phone]   Fax:  [phone]  Physical Therapy Evaluation  Patient Details  Name: Wendy Heath MRN: [id number] Date of Birth: 1950-12-01 Referring Provider (PT): Rory Percy, DO:  Madison Hickman, MD  CLINIC OPERATION CHANGES: Outpatient Neuro Rehab is open at lower capacity following universal masking, social distancing, and patient screening.  The patient's COVID risk of complications score is 4.  Encounter Date: 10/14/2018  PT End of Session - 10/15/18 2053    Visit Number  1    Number of Visits  1    Authorization Type  [employer]    PT Start Time  9924    PT Stop Time  1450    PT Time Calculation (min)  48 min    Activity Tolerance  Patient tolerated treatment well    Behavior During Therapy  WFL for tasks assessed/performed       Past Medical History:  Diagnosis Date  . Blind right eye 07/04/2011   Since stroke 2003  . CEREBROVASCULAR ACCIDENT, HX OF 08/24/2006  . Diverticulitis   . GERD 08/24/2006  . HYPERLIPIDEMIA 08/24/2006  . HYPERTENSION 08/24/2006  . Impaired glucose tolerance 07/04/2011  . Malignant neoplasm of breast (female), unspecified site 03/13/2011  . Personal history of chemotherapy   . Personal history of radiation therapy 12/26/2006  . Stroke [facility]) 2005    Past Surgical History:  Procedure Laterality Date  . ABDOMINAL HYSTERECTOMY  1995  . BREAST BIOPSY  06/10/2006   malignant  . BREAST LUMPECTOMY Left 11/25/2006   malignant  . COLONOSCOPY N/A 02/05/2013   Procedure: COLONOSCOPY;  Surgeon: Beryle Beams, MD;  Location: [facility] ENDOSCOPY;  Service: Endoscopy;  Laterality: N/A;    Vitals:   10/14/18 1406  BP: 117/82  Pulse: 96     Subjective Assessment - 10/14/18 1359    Subjective  Pt reports room spinning vertigo that started about 3 months ago    Patient is accompained by:  Family member   niece waiting in  lobby   Patient Stated Goals  resolve the vertigo    Currently in Pain?  No/denies         [facility] PT Assessment - 10/15/18 0001      Assessment   Medical Diagnosis  Vertigo    Referring Provider (PT)  Rory Percy, DO:  Madison Hickman, MD    Onset Date/Surgical Date  --   Jan. 2020     Precautions   Precautions  Fall;Other (comment)   Vertigo      Balance Screen   Has the patient fallen in the past 6 months  No    Has the patient had a decrease in activity level because of a fear of falling?   Yes    Is the patient reluctant to leave their home because of a fear of falling?   Yes      [place] residence    Living Arrangements  Alone    Type of [place] to enter    Entrance Stairs-Number of Steps  12    Entrance Stairs-Rails  Right    [place]  One level    Additional Comments  pt states she lives on 2nd floor of building           Vestibular Assessment - 10/15/18 0001      Vestibular  Assessment   General Observation  pt amb. from lobby to exam room with very unsteady, slow gait due to c/o dizziness      Symptom Behavior   Subjective history of current problem  pt states she has had dizziness for past several months    Type of Dizziness   Spinning    Frequency of Dizziness  varies depending on movement    Duration of Dizziness  minutes    Symptom Nature  Positional    Aggravating Factors  Rolling to right;Supine to sit;Looking up to the ceiling    Relieving Factors  Head stationary    Progression of Symptoms  Worse      Oculomotor Exam   Oculomotor Alignment  Normal    Spontaneous  Absent      Positional Testing   Dix-Hallpike  Dix-Hallpike Right;Dix-Hallpike Left      Dix-Hallpike Right   Dix-Hallpike Right Duration  approx. 15 secs    Dix-Hallpike Right Symptoms  Upbeat, right rotatory nystagmus      Dix-Hallpike Left   Dix-Hallpike Left Duration  none    Dix-Hallpike Left  Symptoms  No nystagmus          Objective measurements completed on examination: See above findings.      Pt was treated with Epley maneuver - 3 reps - for Rt BPPV; symptoms fully resolved on 3rd rep of Epley maneuver with no nystagmus And no c/o vertigo        PT Education - 10/15/18 2052    Education Details  pt instructed in Epley maneuver for self treatment prn should BPPV re-occur    Person(s) Educated  Patient    Methods  Explanation;Demonstration;Handout    Comprehension  Verbalized understanding                  Plan - 10/15/18 2054    Clinical Impression Statement  Pt had (+) Rt Dix-Hallpike test with rotary upbeating nystagmus indicative of Rt posterior canalithiasis; symptoms fully resolved on 3rd rep of Epley with no nystagmus noted in any position of maneuver and no c/o vertigo reported.  Pt stated she felt much better at end of session after Epley maneuver performed.  Pt noted to amb. out with steady gait and no imbalance.    Personal Factors and Comorbidities  Comorbidity 1;Time since onset of injury/illness/exacerbation    Examination-Activity Limitations  Locomotion Level;Transfers;Stairs;Squat    Examination-Participation Restrictions  Community Activity;Laundry;Shop;Cleaning;Meal Prep    Stability/Clinical Decision Making  Stable/Uncomplicated    Clinical Decision Making  Low    Rehab Potential  Good    PT Frequency  One time visit   vertigo resolved - pt does not wish to schedule follow up appt at this time   PT Treatment/Interventions  Canalith Repostioning;ADLs/Self Care Home Management;Neuromuscular re-education;Vestibular;Patient/family education    PT Next Visit Plan  N/A - eval with treatment only    PT Home Exercise Plan  instructed in Epley maneuver for self treatment    Consulted and Agree with Plan of Care  Patient       Patient will benefit from skilled therapeutic intervention in order to improve the following deficits and  impairments:  Decreased balance, Difficulty walking, Dizziness  Visit Diagnosis: 1. BPPV (benign paroxysmal positional vertigo), right        Problem List Patient Active Problem List   Diagnosis Date Noted  . Postural hypotension 06/19/2018  . Dizziness 05/16/2018  . Pain in both thighs 04/01/2018  . Type 2  diabetes mellitus treated without insulin ([place]) 05/20/2016  . History of left breast cancer 03/25/2015  . Hypokalemia 02/03/2013  . Seasonal allergies 05/14/2012  . Blind right eye 07/04/2011  . OBESITY 12/28/2008  . Chronic pain of right knee 12/28/2008  . Hyperlipidemia 08/24/2006  . Essential hypertension 08/24/2006  . GERD 08/24/2006  . CEREBROVASCULAR ACCIDENT, HX OF 08/24/2006  . DOMESTIC ABUSE, HX OF 08/24/2006    Alda Lea, PT 10/15/2018, 9:03 PM  [facility] [address] [place], [place], [address] Phone: [phone]   Fax:  [phone]  Name: Wendy Heath MRN: [id number] Date of Birth: 20-Apr-1951

## 2018-10-15 NOTE — Assessment & Plan Note (Signed)
Secondary to vertigo, resolved after vestibular rehab.  Instructed to continue on meclizine for 2 more days to finish out course given she is not having any adverse effects to medication.  Directed to check blood pressure whenever she goes to the drugstore for ongoing monitoring.  Advised to call if symptoms return.  Follow-up in 3-6 months for blood pressure and diabetes.

## 2018-11-12 ENCOUNTER — Other Ambulatory Visit: Payer: Self-pay | Admitting: Family Medicine

## 2018-11-26 ENCOUNTER — Ambulatory Visit: Payer: Medicare HMO | Admitting: Family Medicine

## 2018-11-27 ENCOUNTER — Ambulatory Visit: Payer: Medicare HMO | Admitting: Family Medicine

## 2018-12-01 ENCOUNTER — Ambulatory Visit (INDEPENDENT_AMBULATORY_CARE_PROVIDER_SITE_OTHER): Payer: Medicare HMO | Admitting: Family Medicine

## 2018-12-01 ENCOUNTER — Encounter: Payer: Self-pay | Admitting: Family Medicine

## 2018-12-01 ENCOUNTER — Other Ambulatory Visit: Payer: Self-pay

## 2018-12-01 ENCOUNTER — Other Ambulatory Visit: Payer: Self-pay | Admitting: Family Medicine

## 2018-12-01 VITALS — BP 138/90 | HR 74

## 2018-12-01 DIAGNOSIS — M25562 Pain in left knee: Secondary | ICD-10-CM

## 2018-12-01 DIAGNOSIS — M25561 Pain in right knee: Secondary | ICD-10-CM

## 2018-12-01 DIAGNOSIS — G8929 Other chronic pain: Secondary | ICD-10-CM

## 2018-12-01 DIAGNOSIS — M79602 Pain in left arm: Secondary | ICD-10-CM | POA: Diagnosis not present

## 2018-12-01 MED ORDER — DICLOFENAC SODIUM 1 % TD GEL: TRANSDERMAL | 1 refills | Status: DC

## 2018-12-01 MED ORDER — METHYLPREDNISOLONE ACETATE 40 MG/ML IJ SUSP
40.0000 mg | Freq: Once | INTRAMUSCULAR | Status: AC
  Administered 2018-12-01: 40 mg via INTRA_ARTICULAR

## 2018-12-01 NOTE — Patient Instructions (Signed)
It was great to see you!  Our plans for today:  - We gave you steroid shots in your knee today. You should make sure not to eat any sugary foods or drinks over the next few days as steroids can elevate your blood sugars. - If you have symptoms of high blood sugars - jitteriness, nausea, vision changes, let us know. - If the area we injected you becomes red, swollen, or warm, or if you have fevers let us know. - Go to [place] at [facility] between 8am and 4pm Monday through Friday to get your xray.  Take care and seek immediate care sooner if you develop any concerns.   Dr. [facility]

## 2018-12-01 NOTE — Assessment & Plan Note (Addendum)
No red flags on history or exam.  Symptoms most consistent with osteoarthritis of bilateral knees especially given obesity and symptomatology.  No findings concerning for infection, blood clot.  Steroid injection provided to bilateral knees with no immediate complications.  Advised continued symptomatic relief with Voltaren gel.  Advised weight loss and continued physical activity to strengthen supporting musculature.  Advised to obtain standing AP knee x-rays.  Return precautions given.

## 2018-12-01 NOTE — Progress Notes (Signed)
  Subjective:   Patient ID: Wendy Heath    DOB: [birth year], 68 y.o. female   MRN: [id number]  Wendy Heath is a 68 y.o. female with a history of HTN, GERD, DM2, HLD, chronic knee pain, h/o stroke, obesity, seasonal allergies here for   Knee Pain Patient with h/o chronic knee pain reports return of symptoms 2 weeks ago. Denies new trauma or injury.  Previously received steroid injection in the right knee approximately 1 year ago with relief of pain until about 2 weeks ago.  Has also been using Voltaren gel which helps relieve her pain for a little while.  Previously ordered standing knee x-rays last year but patient has not obtained these yet.  Reports her pain is worse whenever it is raining or cloudy out.  She does not walk much due to the pain although previously did exercise at [employer] which helped reduce her pain severity.  Review of Systems:  Per HPI.  Alligator, medications and smoking status reviewed.  Objective:   BP 138/90   Pulse 74  Vitals and nursing note reviewed.  General: Overweight female, in no acute distress with non-toxic appearance Skin: warm, dry, no rashes or lesions Extremities: warm and well perfused, normal tone MSK: ROM grossly intact, strength intact, gait normal.  No joint laxity or appreciable deformities.  Some pain with palpation of joint line along right knee.  Some pain with compression of right patella. Neuro: Alert and oriented, speech normal  INJECTION: Patient was given informed consent, signed copy in the chart. Appropriate time out was taken. Area prepped and draped in usual sterile fashion. 4 cc of methylprednisolone 40 mg/ml plus  1 cc of 1% lidocaine without epinephrine was injected into the bilateral knees using a(n) medial approach. The patient tolerated the procedure well. There were no complications. Post procedure instructions were given.  Assessment & Plan:   Chronic pain of both knees No red flags on history or exam.  Symptoms  most consistent with osteoarthritis of bilateral knees especially given obesity and symptomatology.  No findings concerning for infection, blood clot.  Steroid injection provided to bilateral knees with no immediate complications.  Advised continued symptomatic relief with Voltaren gel.  Advised weight loss and continued physical activity to strengthen supporting musculature.  Advised to obtain standing AP knee x-rays.  Return precautions given.  Orders Placed This Encounter  Procedures  . DG Knee Bilateral Standing AP    Standing Status:   Future    Standing Expiration Date:   01/31/2020    Order Specific Question:   Reason for Exam (SYMPTOM  OR DIAGNOSIS REQUIRED)    Answer:   knee pain    Order Specific Question:   Preferred imaging location?    Answer:   [facility]    Order Specific Question:   Radiology Contrast Protocol - do NOT remove file path    Answer:   [email or web address]   Meds ordered this encounter  Medications  . diclofenac sodium (VOLTAREN) 1 % GEL    Sig: APPLY 2 GM TOPICALLY 4 (FOUR) TIMES DAILY as needed    Dispense:  100 g    Refill:  1  . methylPREDNISolone acetate (DEPO-MEDROL) injection 40 mg  . methylPREDNISolone acetate (DEPO-MEDROL) injection 40 mg    Rory Percy, DO PGY-3, [facility] 12/01/2018 5:11 PM

## 2018-12-11 ENCOUNTER — Other Ambulatory Visit: Payer: Self-pay

## 2018-12-11 ENCOUNTER — Ambulatory Visit
Admission: RE | Admit: 2018-12-11 | Discharge: 2018-12-11 | Disposition: A | Discharge status: Home / Self Care | Payer: Medicare HMO | Source: Ambulatory Visit | Attending: Family Medicine | Admitting: Family Medicine

## 2018-12-11 DIAGNOSIS — G8929 Other chronic pain: Secondary | ICD-10-CM

## 2018-12-11 DIAGNOSIS — M170 Bilateral primary osteoarthritis of knee: Secondary | ICD-10-CM | POA: Diagnosis not present

## 2018-12-11 DIAGNOSIS — M25562 Pain in left knee: Secondary | ICD-10-CM

## 2018-12-12 ENCOUNTER — Other Ambulatory Visit: Payer: Self-pay | Admitting: Family Medicine

## 2018-12-16 ENCOUNTER — Ambulatory Visit
Admission: RE | Admit: 2018-12-16 | Discharge: 2018-12-16 | Disposition: A | Discharge status: Home / Self Care | Payer: Medicare HMO | Source: Ambulatory Visit | Attending: Family Medicine | Admitting: Family Medicine

## 2018-12-16 ENCOUNTER — Other Ambulatory Visit: Payer: Self-pay

## 2018-12-16 DIAGNOSIS — Z1231 Encounter for screening mammogram for malignant neoplasm of breast: Secondary | ICD-10-CM | POA: Diagnosis not present

## 2018-12-19 ENCOUNTER — Other Ambulatory Visit: Payer: Self-pay | Admitting: Family Medicine

## 2018-12-19 DIAGNOSIS — R928 Other abnormal and inconclusive findings on diagnostic imaging of breast: Secondary | ICD-10-CM

## 2018-12-24 ENCOUNTER — Other Ambulatory Visit: Payer: Self-pay | Admitting: Family Medicine

## 2018-12-24 ENCOUNTER — Other Ambulatory Visit: Payer: Self-pay

## 2018-12-24 ENCOUNTER — Ambulatory Visit
Admission: RE | Admit: 2018-12-24 | Discharge: 2018-12-24 | Disposition: A | Discharge status: Home / Self Care | Payer: Medicare HMO | Source: Ambulatory Visit | Attending: Family Medicine | Admitting: Family Medicine

## 2018-12-24 DIAGNOSIS — R921 Mammographic calcification found on diagnostic imaging of breast: Secondary | ICD-10-CM | POA: Diagnosis not present

## 2018-12-24 DIAGNOSIS — R928 Other abnormal and inconclusive findings on diagnostic imaging of breast: Secondary | ICD-10-CM

## 2019-02-17 ENCOUNTER — Telehealth: Payer: Self-pay | Admitting: *Deleted

## 2019-02-17 NOTE — Telephone Encounter (Signed)
Pt states that her dizziness is back.  She is going to call Neuro-rehab tomorrow but wants to know if she can have some more meclizine in the meantime. [name], CMA

## 2019-02-17 NOTE — Telephone Encounter (Signed)
Since it's been 4 months since she was last seen for it, she'll have to have an appointment but if it's truly the same, vestibular rehab will be the most helpful.

## 2019-02-18 NOTE — Telephone Encounter (Signed)
Spoke with pt. Informed her of note. Pt stated that she would call vestibular rehab to make an appt with them. Salvatore Marvel, CMA

## 2019-02-25 ENCOUNTER — Ambulatory Visit (INDEPENDENT_AMBULATORY_CARE_PROVIDER_SITE_OTHER): Payer: Medicare HMO | Admitting: Family Medicine

## 2019-02-25 ENCOUNTER — Other Ambulatory Visit: Payer: Self-pay

## 2019-02-25 ENCOUNTER — Encounter: Payer: Self-pay | Admitting: Family Medicine

## 2019-02-25 VITALS — BP 108/70

## 2019-02-25 DIAGNOSIS — R42 Dizziness and giddiness: Secondary | ICD-10-CM | POA: Diagnosis not present

## 2019-02-25 DIAGNOSIS — Z9189 Other specified personal risk factors, not elsewhere classified: Secondary | ICD-10-CM | POA: Insufficient documentation

## 2019-02-25 NOTE — Patient Instructions (Signed)
It was great to see you!  Our plans for today:  - STOP taking amlodipine. - We are referring you back to vestibular rehab. - I recommend being seen in our geriatric clinic.  - Come back in 1 month to recheck your blood pressure and diabetes follow up.  Take care and seek immediate care sooner if you develop any concerns.   Dr. [facility]

## 2019-02-25 NOTE — Assessment & Plan Note (Addendum)
Likely multifactorial. BP low today at 108/70 after taking additional antihypertensives that had previously been discontinued. Orthostatics negative today but with low BP, suspect is contributory. Also with h/o BPPV, previously relieved with vestibular rehab, +Dix Hallpike today. Will refer back to vestibular rehab. Extensive counseling done regarding not taking amlodipine. Considered but low likelihood of Meniere's, trauma, tumor given symptomatology and exam.

## 2019-02-25 NOTE — Progress Notes (Signed)
  Subjective:   Patient ID: Wendy Heath    DOB: 1950-10-16, 68 y.o. female   MRN: [id number]  Wendy Heath is a 68 y.o. female with a history of HTN, GERD, T2DM, HLD, obesity, seasonal allergies, blind in R eye, h/o L breast cancer here for   Dizziness - Previously with BPPV, resolved with vestibular rehab. Had not had relief with meclizine. - continues to feel lightheaded and off balance. Fell on Sunday due to lightheadedness but did not hit her head. - BP meds: HCTZ 12.5mg , lisinopril 20mg . Does report she continued to take amlodipine, forgot it had been discontinued at a prior appointment. - does not check BP at home - denies palpitations, heart racing, hearing loss, ear pain or fullness, N/V, vision changes, weakness in extremities, difficulties speaking, headache - she is on ASA 81mg   Review of Systems:  Per HPI.  Medications and smoking status reviewed.  Objective:   BP 108/70  Vitals and nursing note reviewed.  General: overweight elderly female, in no acute distress with non-toxic appearance HEENT: normocephalic, atraumatic, moist mucous membranes Neck: supple, non-tender without lymphadenopathy CV: regular rate and rhythm without murmurs, rubs, or gallops Lungs: clear to auscultation bilaterally with normal work of breathing MSK: Full ROM, strength 5/5 to U/LE bilaterally, deep reflexes intact, normal gait.  No edema.  Neuro: Alert and oriented, speech normal.  Romberg negative.  Optic field normal. Extraocular movements intact.  Intact symmetric sensation to light touch of face and extremities bilaterally.  Hearing grossly intact bilaterally.  Tongue protrudes normally with no deviation.  Shoulder shrug, smile symmetric. Finger to nose normal. Marye Round performed with exacerbation of symptoms.  Assessment & Plan:   Dizziness Likely multifactorial. BP low today at 108/70 after taking additional antihypertensives that had previously been discontinued.  Orthostatics negative today but with low BP, suspect is contributory. Also with h/o BPPV, previously relieved with vestibular rehab, +Dix Hallpike today. Will refer back to vestibular rehab. Extensive counseling done regarding not taking amlodipine. Considered but low likelihood of Meniere's, trauma, tumor given symptomatology and exam.   No orders of the defined types were placed in this encounter.  No orders of the defined types were placed in this encounter.   Rory Percy, DO PGY-3, [facility] 02/25/2019 5:07 PM

## 2019-03-06 ENCOUNTER — Ambulatory Visit: Payer: Medicare HMO

## 2019-03-27 ENCOUNTER — Encounter: Payer: Self-pay | Admitting: Family Medicine

## 2019-03-27 ENCOUNTER — Other Ambulatory Visit: Payer: Self-pay

## 2019-03-27 ENCOUNTER — Ambulatory Visit (INDEPENDENT_AMBULATORY_CARE_PROVIDER_SITE_OTHER): Payer: Medicare HMO | Admitting: Family Medicine

## 2019-03-27 VITALS — BP 144/82 | HR 63

## 2019-03-27 DIAGNOSIS — I10 Essential (primary) hypertension: Secondary | ICD-10-CM

## 2019-03-27 DIAGNOSIS — E66811 Obesity, class 1: Secondary | ICD-10-CM

## 2019-03-27 DIAGNOSIS — E6609 Other obesity due to excess calories: Secondary | ICD-10-CM

## 2019-03-27 DIAGNOSIS — E785 Hyperlipidemia, unspecified: Secondary | ICD-10-CM

## 2019-03-27 DIAGNOSIS — Z6833 Body mass index (BMI) 33.0-33.9, adult: Secondary | ICD-10-CM | POA: Diagnosis not present

## 2019-03-27 DIAGNOSIS — E119 Type 2 diabetes mellitus without complications: Secondary | ICD-10-CM

## 2019-03-27 LAB — POCT UA - MICROALBUMIN
Albumin/Creatinine Ratio, Urine, POC: <30
Creatinine, POC: 200 mg/dL
Microalbumin Ur, POC: 30 mg/L

## 2019-03-27 LAB — POCT GLYCOSYLATED HEMOGLOBIN (HGB A1C): HbA1c, POC (controlled diabetic range): 6 % (ref 0.0–7.0)

## 2019-03-27 MED ORDER — ZOSTER VAC RECOMB ADJUVANTED 50 MCG/0.5ML IM SUSR: 0.5000 mL | Freq: Once | INTRAMUSCULAR | 0 refills | Status: AC

## 2019-03-27 MED ORDER — TETANUS-DIPHTH-ACELL PERTUSSIS 5-2.5-18.5 LF-MCG/0.5 IM SUSP: 0.5000 mL | Freq: Once | INTRAMUSCULAR | 0 refills | Status: AC

## 2019-03-27 NOTE — Assessment & Plan Note (Addendum)
At goal for age without hypotensive episodes since discontinuing amlodipine. Tolerating regimen ok, no changes made today. Discussed increasing vegetable consumption and regular exercise. F/u in 6 months.

## 2019-03-27 NOTE — Assessment & Plan Note (Signed)
Contributing to HTN and DM2. Discussed weight loss with lifestyle modifications through diet and exercise. See AVS for details.

## 2019-03-27 NOTE — Patient Instructions (Signed)
It was great to see you!  Our plans for today:  - Work on increasing your vegetable consumption. See below for details. - We are checking some labs today, we will call you or send you a letter if they are abnormal.  - Take the Tetanus prescription to the pharmacy to get your vaccine. - Come back in 6 months for check up.  Take care and seek immediate care sooner if you develop any concerns.   Dr. [facility]  Here is an example of what a healthy plate looks like:    ? Make half your plate fruits and vegetables.     ? Focus on whole fruits.     ? Vary your veggies.  ? Make half your grains whole grains. -     ? Look for the word "whole" at the beginning of the ingredients list    ? Some whole-grain ingredients include whole oats, whole-wheat flour,        whole-grain corn, whole-grain brown rice, and whole rye.  ? Move to low-fat and fat-free milk or yogurt.  ? Vary your protein routine. - Meat, fish, poultry (chicken, [place]), eggs, beans (kidney, pinto), dairy.  ? Drink and eat less sodium, saturated fat, and added sugars.   Diet Recommendations for Diabetes   1. Eat at least 3 meals and 1-2 snacks per day. Never go more than 4-5 hours while awake without eating. Eat breakfast within the first hour of getting up.   2. Limit starchy foods to TWO per meal and ONE per snack. ONE portion of a starchy  food is equal to the following:   - ONE slice of bread (or its equivalent, such as half of a hamburger bun).   - 1/2 cup of a "scoopable" starchy food such as potatoes or rice.   - 15 grams of Total Carbohydrate as shown on food label.  3. Include at every meal: a protein food, a carb food, and vegetables and/or fruit.   - Obtain twice the volume of vegetables as protein or carbohydrate foods for both lunch and dinner.   - Fresh or frozen vegetables are best.   - Keep frozen vegetables on hand for a quick vegetable serving.       Starchy (carb) foods: Bread,  rice, pasta, potatoes, corn, cereal, grits, crackers, bagels, muffins, all baked goods.  (Fruits, milk, and yogurt also have carbohydrate, but most of these foods will not spike your blood sugar as most starchy foods will.)  A few fruits do cause high blood sugars; use small portions of bananas (limit to 1/2 at a time), grapes, watermelon, oranges, and most tropical fruits.    Protein foods: Meat, fish, poultry, eggs, dairy foods, and beans such as pinto and kidney beans (beans also provide carbohydrate).

## 2019-03-27 NOTE — Progress Notes (Signed)
  Subjective:   Patient ID: Wendy Heath    DOB: [birth year], 68 y.o. female   MRN: [id number]  Bernardina Shabbir is a 68 y.o. female with a history of HTN, GERD, T2DM, HLD, obesity, knee OA, h/o stroke, R eye blindness here for   Hypertension: - Medications: HCTZ 12.5mg  daily, lisinopril 20mg  daily - Compliance: good - Checking BP at home: no - Denies any SOB, CP, vision changes, LE edema, medication SEs, or symptoms of hypotension - Diet: eats 2 meals per day. Yesterday had sausage biscuit for breakfast, for dinner had chinese food. No vegetables yesterday. Yesterday was typical day for her.  - Exercise: was going to [employer] before COVID. Will walk to store but no formal exercise since COVID.  Diabetes, Type 2 - Last A1c 6.0 09/2018 - Medications: metformin 500mg  daily - Compliance: good - Checking BG at home: no - Diet: see above - Exercise: see above - Eye exam: seen at [facility] in April or May of this year - Foot exam: due - Microalbumin: due - Statin: yes - Denies symptoms of hypoglycemia, polyuria, polydipsia, numbness extremities, foot ulcers/trauma  Review of Systems:  Per HPI.  Medications and smoking status reviewed.  Objective:   BP (!) 144/82   Pulse 63   SpO2 97%  Vitals and nursing note reviewed.  General: overweight, elderly female, in no acute distress with non-toxic appearance CV: regular rate and rhythm without murmurs, rubs, or gallops, no lower extremity edema Lungs: clear to auscultation bilaterally with normal work of breathing Skin: warm, dry, no rashes or lesions Extremities: warm and well perfused, normal tone. Foot exam normal. MSK: ROM grossly intact, gait normal Neuro: Alert and oriented, speech normal  Assessment & Plan:   Essential hypertension At goal for age without hypotensive episodes since discontinuing amlodipine. Tolerating regimen ok, no changes made today. Discussed increasing vegetable consumption  and regular exercise. F/u in 6 months.  Hyperlipidemia Tolerating statin ok. Will update lipid panel today.  OBESITY Contributing to HTN and DM2. Discussed weight loss with lifestyle modifications through diet and exercise. See AVS for details.  Type 2 diabetes mellitus without complications (HCC) 123456 6.0. Tolerating metformin. Discussed weight loss through diet and exercise, see AVS for specific recommendations. Foot exam nl. Will obtain eye exam records. Urine microalbumin wnl. Follow up in 6 months.  Orders Placed This Encounter  Procedures  . Lipid Panel  . POCT UA - Microalbumin  . HgB A1c   Meds ordered this encounter  Medications  . Tdap (BOOSTRIX) 5-2.5-18.5 LF-MCG/0.5 injection    Sig: Inject 0.5 mLs into the muscle once for 1 dose.    Dispense:  0.5 mL    Refill:  0  . Zoster Vaccine Adjuvanted [facility]) injection    Sig: Inject 0.5 mLs into the muscle once for 1 dose.    Dispense:  0.5 mL    Refill:  0    Rory Percy, DO PGY-3, [facility] 03/28/2019 9:35 AM

## 2019-03-27 NOTE — Assessment & Plan Note (Signed)
Tolerating statin ok. Will update lipid panel today.

## 2019-03-27 NOTE — Assessment & Plan Note (Addendum)
A1c 6.0. Tolerating metformin. Discussed weight loss through diet and exercise, see AVS for specific recommendations. Foot exam nl. Will obtain eye exam records. Urine microalbumin wnl. Follow up in 6 months.

## 2019-03-28 LAB — LIPID PANEL
Chol/HDL Ratio: 2.7 ratio (ref 0.0–4.4)
Cholesterol, Total: 124 mg/dL (ref 100–199)
HDL: 46 mg/dL (ref >39)
LDL Chol Calc (NIH): 55 mg/dL (ref 0–99)
Triglycerides: 131 mg/dL (ref 0–149)
VLDL Cholesterol Cal: 23 mg/dL (ref 5–40)

## 2019-04-03 ENCOUNTER — Other Ambulatory Visit: Payer: Self-pay

## 2019-04-03 ENCOUNTER — Ambulatory Visit: Payer: Medicare HMO

## 2019-04-10 ENCOUNTER — Other Ambulatory Visit: Payer: Self-pay | Admitting: Family Medicine

## 2019-04-10 DIAGNOSIS — M79602 Pain in left arm: Secondary | ICD-10-CM

## 2019-04-13 ENCOUNTER — Telehealth: Payer: Self-pay | Admitting: *Deleted

## 2019-04-13 NOTE — Telephone Encounter (Signed)
Spoke with patient and informed her of geri appt in February.  Will resend paperwork.  Nila Winker,CMA

## 2019-04-13 NOTE — Telephone Encounter (Signed)
-----   Message from Valerie Roys, [place] sent at 02/25/2019 10:58 AM EST ----- Regarding: geri appt Needs geri appt in January.  Paperwork already provided for her.  Vincenza Dail,CMA

## 2019-05-18 ENCOUNTER — Encounter: Payer: Self-pay | Admitting: Family Medicine

## 2019-05-18 ENCOUNTER — Ambulatory Visit: Payer: Medicare Other | Admitting: Family Medicine

## 2019-05-18 ENCOUNTER — Other Ambulatory Visit: Payer: Self-pay

## 2019-05-18 DIAGNOSIS — E2839 Other primary ovarian failure: Secondary | ICD-10-CM

## 2019-05-18 NOTE — Patient Instructions (Addendum)
It was great to speak with you today!  Our plans for today:  - Go for a walk 3 times per week. - Eat at least 3 servings of vegetables per day. - Talk with your son about filling out paperwork for a Healthcare Power of [place]. This will legally allow him to speak for you if you are unable to do so. Talk with your family about what you would want done in a medical emergency and write it down. There is some information attached. I will have our social worker follow up with you about this. - You have already had your tetanus and shingles shots. - Someone will call you about getting a bone density scan.   Take care and seek immediate care sooner if you develop any concerns.   Dr. [facility]  Here is an example of what a healthy plate looks like:    ? Make half your plate fruits and vegetables.     ? Focus on whole fruits.     ? Vary your veggies.  ? Make half your grains whole grains. -     ? Look for the word "whole" at the beginning of the ingredients list    ? Some whole-grain ingredients include whole oats, whole-wheat flour,        whole-grain corn, whole-grain brown rice, and whole rye.  ? Move to low-fat and fat-free milk or yogurt.  ? Vary your protein routine. - Meat, fish, poultry (chicken, [place]), eggs, beans (kidney, pinto), dairy.  ? Drink and eat less sodium, saturated fat, and added sugars.

## 2019-05-18 NOTE — Progress Notes (Signed)
Subjective:   Wendy Heath is a 69 y.o. female who presents for Medicare Annual (Subsequent) preventive examination.  The patient consented to a virtual visit.  Cardiac Risk Factors include: advanced age (>29men, >97 women);diabetes mellitus;dyslipidemia;smoking/ tobacco exposure;sedentary lifestyle;hypertension     Objective:     Vitals: There were no vitals taken for this visit.  There is no height or weight on file to calculate BMI.  Advanced Directives 02/25/2019 12/01/2018 10/15/2018 10/01/2018 08/29/2018 02/17/2018 10/29/2017  Does Patient Have a Medical Advance Directive? No No No No No No No  Type of Advance Directive - - - - - - -  Copy of Healthcare Power of Attorney in Chart? - - - - - - -  Would patient like information on creating a medical advance directive? No - Patient declined No - Patient declined No - Patient declined No - Patient declined No - Patient declined No - Patient declined No - Patient declined  Pre-existing out of facility DNR order (yellow form or pink MOST form) - - - - - - -    Tobacco Social History   Tobacco Use  Smoking Status Current Every Day Smoker  . Packs/day: 0.25  . Years: 50.00  . Pack years: 12.50  . Types: Cigarettes  Smokeless Tobacco Never Used     Ready to quit: Not Answered Counseling given: Yes   Clinical Intake:     Pain : No/denies pain     Diabetes: Yes CBG done?: No  How often do you need to have someone help you when you read instructions, pamphlets, or other written materials from your doctor or pharmacy?: 1 - Never What is the last grade level you completed in school?: 11  Interpreter Needed?: No     Past Medical History:  Diagnosis Date  . Blind right eye 07/04/2011   Since stroke 2003  . CEREBROVASCULAR ACCIDENT, HX OF 08/24/2006  . Diverticulitis   . Dizziness 05/16/2018  . GERD 08/24/2006  . HYPERLIPIDEMIA 08/24/2006  . HYPERTENSION 08/24/2006  . Impaired glucose tolerance 07/04/2011  .  Malignant neoplasm of breast (female), unspecified site 03/13/2011  . Personal history of chemotherapy   . Personal history of radiation therapy 12/26/2006  . Postural hypotension 06/19/2018  . Stroke [facility]) 2005   Past Surgical History:  Procedure Laterality Date  . ABDOMINAL HYSTERECTOMY  1995  . BREAST BIOPSY  06/10/2006   malignant  . BREAST LUMPECTOMY Left 11/25/2006   malignant  . COLONOSCOPY N/A 02/05/2013   Procedure: COLONOSCOPY;  Surgeon: Beryle Beams, MD;  Location: [facility];  Service: Endoscopy;  Laterality: N/A;   Family History  Problem Relation Age of Onset  . Stroke Other   . Cancer Other        breast cancer  . Cancer Other        breast cancer  . Stroke Other   . Hypertension Other   . Diabetes Other   . Sudden death Other   . Breast cancer Mother   . Heart disease Father   . Breast cancer Sister   . Heart disease Brother    Social History   Socioeconomic History  . Marital status: Divorced    Spouse name: Not on file  . Number of children: 3  . Years of education: 89  . Highest education level: Not on file  Occupational History    Employer: UNEMPLOYED  Tobacco Use  . Smoking status: Current Every Day Smoker    Packs/day: 0.25  Years: 50.00    Pack years: 12.50    Types: Cigarettes  . Smokeless tobacco: Never Used  Substance and Sexual Activity  . Alcohol use: Yes    Comment: Once a month.   . Drug use: No  . Sexual activity: Not Currently  Other Topics Concern  . Not on file  Social History Narrative  . Not on file   Social Determinants of Health   Financial Resource Strain: Medium Risk  . Difficulty of Paying Living Expenses: Somewhat hard  Food Insecurity:   . Worried About Charity fundraiser in the Last Year: Not on file  . Ran Out of Food in the Last Year: Not on file  Transportation Needs: No Transportation Needs  . Lack of Transportation (Medical): No  . Lack of Transportation (Non-Medical): No  Physical Activity:  Inactive  . Days of Exercise per Week: 0 days  . Minutes of Exercise per Session: 0 min  Stress:   . Feeling of Stress : Not on file  Social Connections:   . Frequency of Communication with Friends and Family: Not on file  . Frequency of Social Gatherings with Friends and Family: Not on file  . Attends Religious Services: Not on file  . Active Member of Clubs or Organizations: Not on file  . Attends Archivist Meetings: Not on file  . Marital Status: Not on file    Outpatient Encounter Medications as of 05/18/2019  Medication Sig  . aspirin 81 MG tablet Take 81 mg by mouth daily.   . cetirizine (ZYRTEC) 10 MG tablet TAKE 1 TABLET EVERY DAY  . cholecalciferol (VITAMIN D) 400 UNITS TABS Take 400 Units by mouth daily.   . clopidogrel (PLAVIX) 75 MG tablet TAKE 1 TABLET DAILY (DO NOT TAKE FOR 1 MORE WEEK, THEN RESUME)  . diclofenac sodium (VOLTAREN) 1 % GEL APPLY 2 GM TOPICALLY 4 (FOUR) TIMES DAILY as needed  . famotidine (PEPCID) 40 MG tablet TAKE 1 TABLET EVERY DAY  . fluticasone (FLONASE) 50 MCG/ACT nasal spray Place 2 sprays into both nostrils daily.  [name] FLUZONE HIGH-DOSE QUADRIVALENT 0.7 ML SUSY   . hydrochlorothiazide (HYDRODIURIL) 12.5 MG tablet TAKE 1 TABLET EVERY DAY  . lisinopril (ZESTRIL) 20 MG tablet TAKE 1 TABLET EVERY DAY  . metFORMIN (GLUCOPHAGE) 500 MG tablet TAKE 1 TABLET EVERY DAY WITH BREAKFAST  . polyethylene glycol (MIRALAX / GLYCOLAX) packet Take 17 g by mouth daily. While on the pain medicine  . rosuvastatin (CRESTOR) 10 MG tablet TAKE 1 TABLET EVERY DAY  . [DISCONTINUED] gabapentin (NEURONTIN) 400 MG capsule Take 1 capsule (400 mg total) by mouth 3 (three) times daily.  . [DISCONTINUED] meloxicam (MOBIC) 7.5 MG tablet Take 1 tablet (7.5 mg total) by mouth daily as needed for pain.  . [DISCONTINUED] potassium chloride (MICRO-K) 10 MEQ CR capsule Take 1 capsule (10 mEq total) by mouth daily.   No facility-administered encounter medications on file as of  05/18/2019.    Activities of Daily Living In your present state of health, do you have any difficulty performing the following activities: 05/18/2019  Walking or climbing stairs? (No Data)  Comment requires someone walking on her side, blind in R eye  Dressing or bathing? N  Doing errands, shopping? N  Preparing Food and eating ? N  Managing your Medications? N  Managing your Finances? N  Housekeeping or managing your Housekeeping? N  Some recent data might be hidden    Patient Care Team: Rory Percy, DO as  PCP - General (Family Medicine)    Assessment:   This is a routine wellness examination for Evelena.  Exercise Activities and Dietary recommendations Does not exercise, 2 meals per day, eats fruits and vegetables. Current Exercise Habits: The patient does not participate in regular exercise at present, Exercise limited by: None identified  Goals    . DIET - EAT MORE FRUITS AND VEGETABLES     Try to get at least 3 servings of vegetables every day.    . Exercise 3x per week (30 min per time)     Walk ~3 times per week.        Fall Risk Fall Risk  05/18/2019 02/25/2019 12/01/2018 10/01/2018 08/29/2018  Falls in the past year? 1 1 0 0 0  Number falls in past yr: 0 0 - - -  Injury with Fall? 0 0 - - -  Risk for fall due to : Impaired vision - - - -  Follow up Education provided - - - -   Is the patient's home free of loose throw rugs in walkways, pet beds, electrical cords, etc?   no      Grab bars in the bathroom? no      Handrails on the stairs?   yes      Adequate lighting?   yes  Patient rating of health (0-10) scale: 8   Depression Screen PHQ 2/9 Scores 05/18/2019 02/25/2019 12/01/2018 10/01/2018  PHQ - 2 Score 1 0 0 0     Cognitive Function     6CIT Screen 05/18/2019  What Year? 0 points  What month? 0 points  What time? 0 points  Count back from 20 2 points  Months in reverse 2 points  Repeat phrase 0 points  Total Score 4    Immunization History    Administered Date(s) Administered  . Influenza Split 01/02/2012  . Influenza Whole 02/04/2006, 02/10/2007, 01/28/2008  . Influenza, High Dose Seasonal PF 01/30/2017, 01/30/2018, 02/13/2019  . Influenza,inj,Quad PF,6+ Mos 02/04/2013, 05/18/2016  . Influenza-Unspecified 02/10/2014, 01/21/2017  . Pneumococcal Conjugate-13 05/18/2016  . Pneumococcal Polysaccharide-23 11/21/2004, 07/22/2017  . Td 11/21/2004  . Tdap 04/06/2019  . Zoster 07/02/2012     Screening Tests Health Maintenance  Topic Date Due  . OPHTHALMOLOGY EXAM  08/22/2019  . HEMOGLOBIN A1C  09/25/2019  . FOOT EXAM  03/26/2020  . MAMMOGRAM  12/23/2020  . COLONOSCOPY  02/06/2023  . TETANUS/TDAP  04/05/2029  . INFLUENZA VACCINE  Completed  . DEXA SCAN  Completed  . Hepatitis C Screening  Completed  . PNA vac Low Risk Adult  Completed    Cancer Screenings: Lung: Low Dose CT Chest recommended if Age 77-80 years, 30 pack-year currently smoking OR have quit w/in 15years. Patient does not qualify. Breast:  Up to date on Mammogram? Yes   Up to date of Bone Density/Dexa? No Colorectal: yes  Additional Screenings: Hepatitis C Screening: UTD     Plan:   Annual Medicare Wellness Visit  I have personally reviewed and noted the following in the patient's chart:   . Medical and social history . Use of alcohol, tobacco or illicit drugs  . Current medications and supplements . Functional ability and status . Nutritional status . Physical activity . Advanced directives . List of other physicians . Hospitalizations, surgeries, and ER visits in previous 12 months . Vitals . Screenings to include cognitive, depression, and falls . Referrals and appointments  In addition, I have reviewed and discussed with patient certain preventive  protocols, quality metrics, and best practice recommendations. A written personalized care plan for preventive services as well as general preventive health recommendations were provided to  patient.    This visit was conducted virtually in the setting of the [place] pandemic.    Rory Percy, DO  05/18/2019

## 2019-05-23 NOTE — Progress Notes (Signed)
Patient presents today for initial geriatric clinic visit. PMH is significant for HTN, T2DM, HLD, GERD, hx of stroke (2003), allergic rhinitis, hx of breast cancer (2018), OA in right knee, and is blind in the right eye.  Patient uses a pill box and reports adherence with all medications. She does not have any medication related concerns. She states she uses Voltaren gel to help with arm and knee pain. While Voltaren gel is able to ease arm pain, it does not assist with knee pain.   Objective:  DXA Scan (03/13/2007) DUAL X-RAY ABSORPTIOMETRY (BONE MINERAL DENSITY):  Clinical Data: 69 year old postmenopausal female. The patient reports history of chemotherapy. No priors for comparison.   LUMBAR SPINE (L1-L4) BMD: 1.032 T-score (% of young adult value): -0.1 Z-score (% adult age match value): 0.4  LEFT HIP (NECK)  BMD: 0.780 T-score (% of young adult value): -0.6 Z-score (% adult age match value): -0.3   FRAX Score (05/28/2019) - Major osteoporotic fracture risk: 7.4% - Hip fracture risk: 0.9%  CrCl (05/28/2019): 58 mL/min (adjBW)   EGFR (09/25/17): 73 mL/min/1.67m2  Vitamin D (03/07/11): 60  HbA1c (03/27/19): 6.0  Lipid Panel     Component Value Date/Time   CHOL 124 03/27/2019 1547   TRIG 131 03/27/2019 1547   HDL 46 03/27/2019 1547   CHOLHDL 2.7 03/27/2019 1547   CHOLHDL 3 07/02/2012 0956   VLDL 26.2 07/02/2012 0956   LDLCALC 55 03/27/2019 1547   LABVLDL 23 03/27/2019 1547     BMP Latest Ref Rng & Units 09/25/2017 07/22/2017 03/27/2017  Glucose 65 - 99 mg/dL 93 96 107(H)  BUN 8 - 27 mg/dL _0 Creatinine 0.57 - 1.00 mg/dL 0.93 0.83 0.91  BUN/Creat Ratio 12 - _1 Sodium 134 - 144 mmol/L 142 143 143  Potassium 3.5 - 5.2 mmol/L 3.9 3.0(L) 3.1(L)  Chloride 96 - 106 mmol/L 100 99 98  CO2 20 - 29 mmol/L 27 31(H) 29  Calcium 8.7 - 10.3 mg/dL 9.8 9.9 9.7    Immunization History  Administered Date(s) Administered  . Influenza Split 01/02/2012  . Influenza Whole  02/04/2006, 02/10/2007, 01/28/2008  . Influenza, High Dose Seasonal PF 01/30/2017, 01/30/2018, 02/13/2019  . Influenza,inj,Quad PF,6+ Mos 02/04/2013, 05/18/2016  . Influenza-Unspecified 02/10/2014, 01/21/2017  . Pneumococcal Conjugate-13 05/18/2016  . Pneumococcal Polysaccharide-23 11/21/2004, 07/22/2017  . Td 11/21/2004  . Tdap 04/06/2019  . Zoster 07/02/2012      Assessment/Plan:   Pharmacy Recommendations 1. Decrease famotidine dose from 40 mg daily to 20 mg daily due to patient's CrCl clearance 2. Consider obtaining vitamin D level considering patient takes vitamin D 400 units daily and last vitamin D level was 60. 3. Consider discontinuing Plavix due to how patient has been using since 2003 (per EMR). 4. Defer to Dr. McDiarmid's expertise for appropriate knee pain management.  Medication Reconciliation A drug regimen assessment was performed, including review of allergies, interactions, disease-state management, dosing and immunization history. All medications are dosed appropriately according to renal function except for famotidine. Medications were reviewed with the patient, including name, instructions, indication, goals of therapy, potential side effects, importance of adherence, and safe use.   Medication Related Fall Risk Patient has reported one fall within the past year. Patient does not appear to be taking any of the following mediation classes: anticholinergics, first generation antihistamines, skeletal muscle relaxants, barbiturates, benzodiazepines, hypnotics, TCAs, antipsychotics, alpha-1 antagonists, central-alpha agonists, and/or sulfonylureas. Patient does not appear to be at an increased risk of medication-related  falls at this time.    Immunizations Patient is up to date with all vaccines.  Thank you for involving pharmacy to assist in providing this patient's care.   Drexel Iha, PharmD PGY2 Ambulatory Care Pharmacy Resident

## 2019-05-27 ENCOUNTER — Other Ambulatory Visit: Payer: Self-pay

## 2019-05-28 ENCOUNTER — Ambulatory Visit: Payer: Medicare Other | Admitting: Licensed Clinical Social Worker

## 2019-05-28 ENCOUNTER — Other Ambulatory Visit: Payer: Self-pay

## 2019-05-28 ENCOUNTER — Ambulatory Visit (INDEPENDENT_AMBULATORY_CARE_PROVIDER_SITE_OTHER): Payer: Medicare Other | Admitting: Family Medicine

## 2019-05-28 ENCOUNTER — Ambulatory Visit: Payer: Medicare Other | Attending: Family Medicine | Admitting: Physical Therapy

## 2019-05-28 VITALS — BP 152/85 | Ht 63.0 in | Wt 190.0 lb

## 2019-05-28 DIAGNOSIS — M6281 Muscle weakness (generalized): Secondary | ICD-10-CM

## 2019-05-28 DIAGNOSIS — M25561 Pain in right knee: Secondary | ICD-10-CM

## 2019-05-28 DIAGNOSIS — Z9181 History of falling: Secondary | ICD-10-CM

## 2019-05-28 DIAGNOSIS — G3184 Mild cognitive impairment, so stated: Secondary | ICD-10-CM

## 2019-05-28 DIAGNOSIS — W19XXXD Unspecified fall, subsequent encounter: Secondary | ICD-10-CM

## 2019-05-28 DIAGNOSIS — M25562 Pain in left knee: Secondary | ICD-10-CM

## 2019-05-28 DIAGNOSIS — G479 Sleep disorder, unspecified: Secondary | ICD-10-CM

## 2019-05-28 DIAGNOSIS — Z0189 Encounter for other specified special examinations: Secondary | ICD-10-CM | POA: Diagnosis not present

## 2019-05-28 DIAGNOSIS — E119 Type 2 diabetes mellitus without complications: Secondary | ICD-10-CM

## 2019-05-28 DIAGNOSIS — I10 Essential (primary) hypertension: Secondary | ICD-10-CM | POA: Diagnosis not present

## 2019-05-28 DIAGNOSIS — R928 Other abnormal and inconclusive findings on diagnostic imaging of breast: Secondary | ICD-10-CM

## 2019-05-28 DIAGNOSIS — E1159 Type 2 diabetes mellitus with other circulatory complications: Secondary | ICD-10-CM | POA: Diagnosis not present

## 2019-05-28 DIAGNOSIS — Z7189 Other specified counseling: Secondary | ICD-10-CM

## 2019-05-28 DIAGNOSIS — G8929 Other chronic pain: Secondary | ICD-10-CM

## 2019-05-28 DIAGNOSIS — Z9189 Other specified personal risk factors, not elsewhere classified: Secondary | ICD-10-CM | POA: Diagnosis not present

## 2019-05-28 DIAGNOSIS — I152 Hypertension secondary to endocrine disorders: Secondary | ICD-10-CM

## 2019-05-28 HISTORY — DX: Other abnormal and inconclusive findings on diagnostic imaging of breast: R92.8

## 2019-05-28 NOTE — Assessment & Plan Note (Addendum)
MOCA today 21/30 indicating mild cognitive impairment though patient has preservation of all  iADLs and ADLs. No reversible causes in prior lab work noted. No head imaging available for review. Will continue to monitor for preservation of iADLs. Recommend aerobic exercise, continued social interaction.  Education provided to patient.

## 2019-05-28 NOTE — Assessment & Plan Note (Signed)
12/2018 screening mammogram with likely benign calcifications in L breast without definite appreciable change compared to multiple priors. Has f/u diagnostic mammogram scheduled for March.

## 2019-05-28 NOTE — Progress Notes (Signed)
[facility]:   Patient is accompanied by: son and spouse Primary caregiver: patient Patient's lives alone. Patient information was obtained from patient and relative(s). History/Exam limitations: none. Primary Care Provider: Rory Percy, DO Referring provider: Rory Percy, DO Reason for referral:  Chief Complaint  Patient presents with  . Fall  . Memory concerns   Previous Report Reviewed: lab reports and office notes    Patient's Care Team No care team member to display  ----------------------------------------------------------------------------------------------------------------------------------------------------------------------------------------------------------------------------------------------------------------   HPI by problems:  Chief Complaint  Patient presents with  . Fall  . Memory concerns    Cognitive impairment concern  Are there problems with thinking?  inattention  When were the changes first noticed?  within the past year  Did this change occur abruptly or gradually?  gradual  How have the changes progressed since then?  stable  Has there been any tremors or abnormal movements?  no  Have they had in hallucinations or delusions:  no  Have they appeared more anxious or sad lately?  no  How has their sleep been lately?  difficulty falling asleep  Problem behaviors:  None  Geriatric Depression Scale:  4 / 15  Outpatient Encounter Medications as of 05/28/2019  Medication Sig  . aspirin 81 MG tablet Take 81 mg by mouth daily.   . cetirizine (ZYRTEC) 10 MG tablet TAKE 1 TABLET EVERY DAY  . cholecalciferol (VITAMIN D) 400 UNITS TABS Take 1,000 Units by mouth daily.   . clopidogrel (PLAVIX) 75 MG tablet TAKE 1 TABLET DAILY (DO NOT TAKE FOR 1 MORE WEEK, THEN RESUME)  . diclofenac sodium (VOLTAREN) 1 % GEL APPLY 2 GM TOPICALLY 4 (FOUR) TIMES DAILY as needed  . famotidine (PEPCID) 40 MG tablet TAKE 1  TABLET EVERY DAY  . lisinopril (ZESTRIL) 20 MG tablet TAKE 1 TABLET EVERY DAY  . metFORMIN (GLUCOPHAGE) 500 MG tablet TAKE 1 TABLET EVERY DAY WITH BREAKFAST  . rosuvastatin (CRESTOR) 10 MG tablet TAKE 1 TABLET EVERY DAY  . fluticasone (FLONASE) 50 MCG/ACT nasal spray Place 2 sprays into both nostrils daily. (Patient not taking: Reported on 05/28/2019)  . hydrochlorothiazide (HYDRODIURIL) 12.5 MG tablet TAKE 1 TABLET EVERY DAY (Patient not taking: Reported on 05/28/2019)  . [DISCONTINUED] FLUZONE HIGH-DOSE QUADRIVALENT 0.7 ML SUSY   . [DISCONTINUED] polyethylene glycol (MIRALAX / GLYCOLAX) packet Take 17 g by mouth daily. While on the pain medicine   No facility-administered encounter medications on file as of 05/28/2019.    History Patient Active Problem List   Diagnosis Date Noted  . Abnormal mammogram of left breast 05/28/2019  . Mild cognitive impairment 05/28/2019  . History of fall 05/28/2019  . Type 2 diabetes mellitus without complications ([place]) 123456  . Seasonal allergies 05/14/2012  . Blind right eye 07/04/2011  . OBESITY 12/28/2008  . Primary localized osteoarthritis of both knees 12/28/2008  . Hyperlipidemia associated with type 2 diabetes mellitus ([place]) 08/24/2006  . Hypertension associated with diabetes ([place]) 08/24/2006  . GERD 08/24/2006  . CEREBROVASCULAR ACCIDENT, HX OF 08/24/2006  . DOMESTIC ABUSE, HX OF 08/24/2006   Past Medical History:  Diagnosis Date  . Abnormal mammogram of left breast 05/28/2019   12/2018 Dg MM: Diagnostic left mammogram is recommended in 6 months to confirm stability of Probably benign calcifications at the lumpectomy site in the left breast without definite appreciable change compared to multiple priors.    . Blind right eye 07/04/2011   Since stroke 2003  . CEREBROVASCULAR ACCIDENT, HX OF 08/24/2006  .  Chronic pain of both knees 12/28/2008   Qualifier: Diagnosis of  By: Hassell Done FNP, Tori Milks    . Diverticulitis   . Dizziness 05/16/2018  . GERD  08/24/2006  . History of left breast cancer 03/25/2015  . HYPERLIPIDEMIA 08/24/2006  . Hyperlipidemia associated with type 2 diabetes mellitus ([place]) 08/24/2006   Qualifier: Diagnosis of  By: Radene Ou MD, [place]    . HYPERTENSION 08/24/2006  . Hypertension associated with diabetes ([place]) 08/24/2006   Qualifier: Diagnosis of  By: Radene Ou MD, [place]    . Impaired glucose tolerance 07/04/2011  . Malignant neoplasm of breast (female), unspecified site 03/13/2011  . OBESITY 12/28/2008   Qualifier: Diagnosis of  By: Hassell Done FNP, Tori Milks    . Pain in both thighs 04/01/2018  . Personal history of chemotherapy   . Personal history of radiation therapy 12/26/2006  . Postural hypotension 06/19/2018  . Seasonal allergies 05/14/2012  . Stroke ([place]) 2005  . Type 2 diabetes mellitus without complications ([place]) [id number]   Past Surgical History:  Procedure Laterality Date  . ABDOMINAL HYSTERECTOMY  1995  . BREAST BIOPSY  06/10/2006   malignant  . BREAST LUMPECTOMY Left 11/25/2006   malignant  . COLONOSCOPY N/A 02/05/2013   Procedure: COLONOSCOPY;  Surgeon: Beryle Beams, MD;  Location: [facility];  Service: Endoscopy;  Laterality: N/A;   Family History  Problem Relation Age of Onset  . Stroke Other   . Cancer Other        breast cancer  . Cancer Other        breast cancer  . Stroke Other   . Hypertension Other   . Diabetes Other   . Sudden death Other   . Breast cancer Mother   . Heart disease Father   . Breast cancer Sister   . Heart disease Brother    Social History   Socioeconomic History  . Marital status: Divorced    Spouse name: Not on file  . Number of children: 3  . Years of education: 78  . Highest education level: Not on file  Occupational History    Employer: UNEMPLOYED  Tobacco Use  . Smoking status: Current Every Day Smoker    Packs/day: 0.25    Years: 50.00    Pack years: 12.50    Types: Cigarettes  . Smokeless tobacco: Never Used  Substance and Sexual Activity  .  Alcohol use: Yes    Comment: Once a month.   . Drug use: No  . Sexual activity: Not Currently  Other Topics Concern  . Not on file  Social History Narrative  . Not on file   Social Determinants of Health   Financial Resource Strain: Medium Risk  . Difficulty of Paying Living Expenses: Somewhat hard  Food Insecurity:   . Worried About Charity fundraiser in the Last Year: Not on file  . Ran Out of Food in the Last Year: Not on file  Transportation Needs: No Transportation Needs  . Lack of Transportation (Medical): No  . Lack of Transportation (Non-Medical): No  Physical Activity: Inactive  . Days of Exercise per Week: 0 days  . Minutes of Exercise per Session: 0 min  Stress:   . Feeling of Stress : Not on file  Social Connections:   . Frequency of Communication with Friends and Family: Not on file  . Frequency of Social Gatherings with Friends and Family: Not on file  . Attends Religious Services: Not on file  . Active Member of Clubs or  Organizations: Not on file  . Attends Archivist Meetings: Not on file  . Marital Status: Not on file    Cardiovascular Risk Factors: CVA, Lipids, Obesity and Smoker  Educational History: 11 years formal education Personal History of Seizures: No  Personal History of Stroke: Yes  Personal History of Head Trauma: No  Personal History of Psychiatric Disorders: No   Basic Activities of Daily Living  Dressing: Self-care Eating: Self-care Ambulation: Self-care Toileting: Self-care Bathing: Self-care  Instrumental Activities of Daily Living Shopping: Self-care House/Yard Work: Self-care Administration of medications: Self-care Finances: Self-care Telephone: Self-care Transportation: Self-care  Caregivers in home: patient  Formal Home Health Assistance  Physical Therapy: no  Occupational Therapy: no             Home Aid / Personal Care Service: no             Homemaker services: no  FALLS in last five office visits:   Fall Risk  05/28/2019 05/18/2019 02/25/2019 12/01/2018 10/01/2018  Falls in the past year? 1 1 1  0 0  Number falls in past yr: 0 0 0 - -  Injury with Fall? 0 0 0 - -  Risk for fall due to : - Impaired vision - - -  Follow up - Education provided - - -    Health Maintenance reviewed: Immunization History  Administered Date(s) Administered  . Influenza Split 01/02/2012  . Influenza Whole 02/04/2006, 02/10/2007, 01/28/2008  . Influenza, High Dose Seasonal PF 01/30/2017, 01/30/2018, 02/13/2019  . Influenza,inj,Quad PF,6+ Mos 02/04/2013, 05/18/2016  . Influenza-Unspecified 02/10/2014, 01/21/2017  . Pneumococcal Conjugate-13 05/18/2016  . Pneumococcal Polysaccharide-23 11/21/2004, 07/22/2017  . Td 11/21/2004  . Tdap 04/06/2019  . Zoster 07/02/2012   The patient has no Health Maintenance topics of status Overdue, Due On, or Due Soon  Diet: Regular  Geriatric Syndromes: Constipation yes  Laxative use:no   Incontinence no  Dizziness no   Syncope no  Balance impairment:no    Skin problems no   Visual Impairment yes   Hearing impairment no Dentures problems: no Dry mouth: no  Eating impairment no  Impaired Memory or Cognition yes   Behavioral problems no   Sleep problems yes   Weight loss no Drug Misadventure: yes   Joint pain: yes, chronic knee pain Osteoporosis: no Pressure Ulcers: no Immobility: no Ankle edema: no History of UTIs: no  Vital Signs Weight: 190 lb (86.2 kg) Body mass index is 33.66 kg/m. CrCl cannot be calculated (Patient's most recent lab result is older than the maximum 21 days allowed.). Body surface area is 1.96 meters squared. Vitals:   05/28/19 1348  Weight: 190 lb (86.2 kg)  Height: 5\' 3"  (1.6 m)   Wt Readings from Last 3 Encounters:  05/28/19 190 lb (86.2 kg)  06/18/18 189 lb 8 oz (86 kg)  05/16/18 187 lb 2 oz (84.9 kg)    Hearing Screening   Method: Audiometry   125Hz  250Hz  500Hz  1000Hz  2000Hz  3000Hz  4000Hz  6000Hz  8000Hz   Right ear:    25 25 25  25     Left ear:   25 25 25  25       Visual Acuity Screening   Right eye Left eye Both eyes  Without correction:     With correction: 20/40 20/40 20/40     Physical Examination:  VS reviewed GEN: Alert, Cooperative, Groomed, NAD COR: regular rate LUNGS: normal work of breathing, no accessory muscle use, speaking in full sentences SKIN: No lesion nor rashes of face/trunk/extremities  Neuro: Oriented to person, place, and time;  Psych: Normal affect/thought/speech/language  Mini-Mental State Examination or Montreal Cognitive Assessment:  Patient did  require additional cues or prompts to complete tasks. Patient was cooperative and attentive to testing tasks Patient did  appear motivated to perform well  6CIT Screen 05/18/2019  What Year? 0 points  What month? 0 points  What time? 0 points  Count back from 20 2 points  Months in reverse 2 points  Repeat phrase 0 points  Total Score 4    No flowsheet data found.      Montreal Cognitive Assessment  05/28/2019  Visuospatial/ Executive (0/5) 2  Naming (0/3) 3  Attention: Read list of digits (0/2) 2  Attention: Read list of letters (0/1) 1  Attention: Serial 7 subtraction starting at 100 (0/3) 2  Language: Repeat phrase (0/2) 1  Language : Fluency (0/1) 0  Abstraction (0/2) 0  Delayed Recall (0/5) 3  Orientation (0/6) 6  Total 20  Adjusted Score (based on education) 21    Labs No components found for: [facility]  Lab Results  Component Value Date   VITAMINB12 544 05/18/2016    Lab Results  Component Value Date   FOLATE 15.9 05/18/2016    Lab Results  Component Value Date   TSH 0.74 05/18/2016    No results found for: RPR  No results found for: HIV    Chemistry      Component Value Date/Time   NA 142 09/25/2017 1458   NA 141 03/26/2016 1308   K 3.9 09/25/2017 1458   K 3.5 03/26/2016 1308   CL 100 09/25/2017 1458   CL 106 02/25/2012 1005   CO2 27 09/25/2017 1458   CO2 30 (H) 03/26/2016 1308    BUN 15 09/25/2017 1458   BUN 13.5 03/26/2016 1308   CREATININE 0.93 09/25/2017 1458   CREATININE 1.2 (H) 03/26/2016 1308      Component Value Date/Time   CALCIUM 9.8 09/25/2017 1458   CALCIUM 10.3 03/26/2016 1308   ALKPHOS 86 03/26/2016 1308   AST 16 03/26/2016 1308   ALT 10 03/26/2016 1308   BILITOT 0.43 03/26/2016 1308       CrCl cannot be calculated (Patient's most recent lab result is older than the maximum 21 days allowed.).   Lab Results  Component Value Date   HGBA1C 6.0 03/27/2019     @10RELATIVEDAYS @  Hearing Screening   Method: Audiometry   125Hz  250Hz  500Hz  1000Hz  2000Hz  3000Hz  4000Hz  6000Hz  8000Hz   Right ear:   25 25 25  25     Left ear:   25 25 25  25       Visual Acuity Screening   Right eye Left eye Both eyes  Without correction:     With correction: 20/40 20/40 20/40    Lab Results  Component Value Date   WBC 7.1 10/01/2018   HGB 13.3 10/01/2018   HCT 41.2 10/01/2018   MCV 90 10/01/2018   PLT 242 10/01/2018    No results found for this or any previous visit (from the past 24 hour(s)).  Imaging None  Personal Strengths Active sense of humor Capable of independent living Communication skills Supportive family/friends  Support System Strengths Supportive Relationships, Family, Hopefulness and Able to Communicate Effectively    ------------------------------------------------------------------------------------------------------------------------------------------------------------------------------------------------------------------------------------------------------------------------------------------------------------------------------------------------------------------------------------------------------------------------------------------------------------------------------------------------------------  Assessment and Plan: Please see individual consultation notes from physical therapy, pharmacy and social work for today.    Problem  List Items Addressed This Visit      Cardiovascular and Mediastinum  Hypertension associated with diabetes ([place])   Relevant Orders   VITAMIN D 25 Hydroxy (Vit-D Deficiency, Fractures)     Other   Abnormal mammogram of left breast    12/2018 screening mammogram with likely benign calcifications in L breast without definite appreciable change compared to multiple priors. Has f/u diagnostic mammogram scheduled for March.      Mild cognitive impairment    MOCA today 21/30 indicating mild cognitive impairment though patient has preservation of all  iADLs and ADLs. No reversible causes in prior lab work noted. No head imaging available for review. Will continue to monitor for preservation of iADLs. Recommend aerobic exercise, continued social interaction.  Education provided to patient.      History of fall   Relevant Orders   Ambulatory referral to [place]    Other Visit Diagnoses    Encounter for geriatric assessment    -  Primary   Relevant Orders   Referral to Chronic Care Management Services   Fall, subsequent encounter       Relevant Orders   Basic Metabolic Panel   Muscle weakness       Chronic pain of both knees       Relevant Orders   Ambulatory referral to [place]     Mild cognitive impairment MOCA today 21/30 indicating mild cognitive impairment though patient has preservation of all  iADLs and ADLs. No reversible causes in prior lab work noted. No head imaging available for review. Will continue to monitor for preservation of iADLs. Recommend aerobic exercise, continued social interaction.  Education provided to patient.  Abnormal mammogram of left breast 12/2018 screening mammogram with likely benign calcifications in L breast without definite appreciable change compared to multiple priors. Has f/u diagnostic mammogram scheduled for March.   Patient to Follow up with  Dr. Ky Barban or [facility] in 3 month(s)  > 60 minutes face to  face were spent in total with interdisciplinary discussion, patient and caretaker counseling and coordination of care took more than 20 minutes. The Geriatric interdisciplinary team meet to discuss the patient's assessment, problem list, and recommendations.  The interdisciplinary team consisted of representatives from medicine, pharmacy, physical therapy and social work. The interdisciplinary team meet with the patient and caretakers to review the team's findings, assessments, and recommendations.

## 2019-05-28 NOTE — Therapy (Addendum)
[place], [place], [address] Phone: [phone]   Fax:  [phone]  Physical Therapy Evaluation/Discharge Note  Patient Details  Name: Wendy Heath MRN: [id number] Date of Birth: June 11, 1950 Referring Provider (PT): McDiarmid, Sherren Mocha MD   Encounter Date: 05/28/2019  PT End of Session - 05/28/19 1538    Visit Number  1    Number of Visits  1    Date for PT Re-Evaluation  05/28/19       Past Medical History:  Diagnosis Date  . Abnormal mammogram of left breast 05/28/2019   12/2018 Dg MM: Diagnostic left mammogram is recommended in 6 months to confirm stability of Probably benign calcifications at the lumpectomy site in the left breast without definite appreciable change compared to multiple priors.    . Blind right eye 07/04/2011   Since stroke 2003  . CEREBROVASCULAR ACCIDENT, HX OF 08/24/2006  . Chronic pain of both knees 12/28/2008   Qualifier: Diagnosis of  By: Hassell Done FNP, Tori Milks    . Diverticulitis   . Dizziness 05/16/2018  . GERD 08/24/2006  . History of left breast cancer 03/25/2015  . HYPERLIPIDEMIA 08/24/2006  . Hyperlipidemia associated with type 2 diabetes mellitus ([place]) 08/24/2006   Qualifier: Diagnosis of  By: Radene Ou MD, [place]    . HYPERTENSION 08/24/2006  . Hypertension associated with diabetes ([place]) 08/24/2006   Qualifier: Diagnosis of  By: Radene Ou MD, [place]    . Impaired glucose tolerance 07/04/2011  . Malignant neoplasm of breast (female), unspecified site 03/13/2011  . OBESITY 12/28/2008   Qualifier: Diagnosis of  By: Hassell Done FNP, Tori Milks    . Pain in both thighs 04/01/2018  . Personal history of chemotherapy   . Personal history of radiation therapy 12/26/2006  . Postural hypotension 06/19/2018  . Seasonal allergies 05/14/2012  . Stroke ([place]) 2005  . Type 2 diabetes mellitus without complications ([place]) A999333    Past Surgical History:  Procedure Laterality Date  . ABDOMINAL  HYSTERECTOMY  1995  . BREAST BIOPSY  06/10/2006   malignant  . BREAST LUMPECTOMY Left 11/25/2006   malignant  . COLONOSCOPY N/A 02/05/2013   Procedure: COLONOSCOPY;  Surgeon: Beryle Beams, MD;  [facility];  Service: Endoscopy;  Laterality: N/A;    There were no vitals filed for this visit.       [facility] PT Assessment - 05/28/19 0001      Assessment   Medical Diagnosis  bil knee pain , hx of 1 fall,    Referring Provider (PT)  McDiarmid, Todd MD      AROM   Overall AROM   Deficits    Right Knee Extension  5    Right Knee Flexion  108   pain   Left Knee Extension  5    Left Knee Flexion  100   pain     Strength   Overall Strength Comments  grossly 3-/5 in LE's limiteed by knee OA      Palpation   Palpation comment  tenderness over bil knee joint lines and slight effuction of bil knees.        [facility] Pre-Surgical Assessment - 05/28/19 0001    5 Meter Walk Test- trial 1  5.9 sec    5 Meter Walk Test- trial 2  5 sec.     5 Meter Walk Test- trial 3  6.3 sec.    5 meter walk test average  5.73 sec    4 Stage Balance Test  tolerated for:   10 sec.    4 Stage Balance Test Position  4    comment  SPPB 9/12    Sit To Stand Test- trial 1  17.6 sec.    Comment  high risk of fall > 15 sec          Patient was evaluated by Physical Therapist today during [facility]. Screening questions and medication/history review were completed by the medical team. Please see MD encounter for full assessment.   Clinical Impresssion Statement ;Pt is a 69 yo female  presenting to the [facility] Clinic for weakness/falls/knee pain evaluation. Pt presents with onset of 1 year of knee pain increasing since she has not been at a gym since [place]. Symptoms are limiting rising from chair, transfers and walking due to pain in bil knees.  Pt refuses to use and assistive device. Pt presents with decreased ROM RT knee ext/flex +5 to108 and LT knee ext/flex +5 to 100 pain  limiting. and strength grossly 3-/5 and 4-/5,  Pt tested with moderate balance but clearly unsteady due to painful OA of bil knees especially with transitional movements.Good    walking speed  0.15meters/sec. Pt performed 5xSTS in 17.6 seconds which is indicative of LE weakness and high risk of fall.   Based on Short Physical Performance Battery, pt has a frailty rating of 9/12 with </= 5/12 considered frail and a score of </= 10/12 indicates one or more mobility limitations.  Pt would benefit from skilled PT to address impairments and decrease fall risk.  Physical Therapy home health PT to address knee pain/weakness and to do falls prevention.  Pt does not have transportation and would benefit from [place] [facility] to prevent fallls  An order for HHPT has been placed in chart.  Pt given HEP for sit to stand exericise, gastroc stretching, SLR, LAQ and sitting hamstring stretch.  Pt demo good technique with sit to stand at sink and with chair behind and decreased pain in knees in order to do LE strengtheining     Objective measurements completed on examination: See above findings.              PT Education - 05/28/19 1531    Person(s) Educated  Spouse                    Patient will benefit from skilled therapeutic intervention in order to improve the following deficits and impairments:     Visit Diagnosis: Chronic pain of left knee  Chronic pain of right knee  Muscle weakness (generalized)     Problem List Patient Active Problem List   Diagnosis Date Noted  . Abnormal mammogram of left breast 05/28/2019  . Mild cognitive impairment 05/28/2019  . History of fall 05/28/2019  . Type 2 diabetes mellitus without complications ([place]) [id number]  . Seasonal allergies 05/14/2012  . Blind right eye 07/04/2011  . OBESITY 12/28/2008  . Primary localized osteoarthritis of both knees 12/28/2008  . Hyperlipidemia associated with type 2 diabetes mellitus ([place]) 08/24/2006   . Hypertension associated with diabetes ([place]) 08/24/2006  . GERD 08/24/2006  . CEREBROVASCULAR ACCIDENT, HX OF 08/24/2006  . DOMESTIC ABUSE, HX OF 08/24/2006    Voncille Lo, PT Certified Exercise Expert for the Aging Adult  05/28/19 4:09 PM Phone: [phone] Fax: [place] [facility] [address] [place], [place], [address] Phone: [phone]   Fax:  [phone]  Name: Wendy Heath MRN: [id number] Date  of Birth: 01-Apr-1951

## 2019-05-28 NOTE — Patient Instructions (Signed)
SEATED Gastroc / Heel Cord Stretch - Seated With Towel   Sit on floor, towel around ball of foot. Gently pull foot in toward body, stretching heel cord and calf. Hold for _30__ seconds. Repeat on involved leg. Repeat __3_ times. Do _3__ times per day.   Achilles / Gastroc, Standing   Stand, right foot behind, heel on floor and turned slightly out, leg straight, forward leg bent. Move hips forward. Hold _30__ seconds. Repeat _3__ times per session. Do _3__ sessions per day.       Dorsiflexion: Self-Mobilization (Sitting)   Feet flat, other foot forward, slide left foot back until gentle stretch is felt. Keep entire foot on floor. Hold _30___ seconds. Relax. Repeat __3__ times per set. Do __3__ sessions per day.   Sit-to-Stand Exercise and Sink or Counter  The sit-to-stand exercise (also known as the chair stand or chair rise exercise) strengthens your lower body and helps you maintain or improve your mobility and independence. The goal is to do the sit-to-stand exercise without using your hands. This will be easier as you become stronger. You should always talk with your health care provider before starting any exercise program, especially if you have had recent surgery. Do the exercise exactly as told by your health care provider and adjust it as directed. It is normal to feel mild stretching, pulling, tightness, or discomfort as you do this exercise, but you should stop right away if you feel sudden pain or your pain gets worse. Do not begin doing this exercise until told by your health care provider. What the sit-to-stand exercise does The sit-to-stand exercise helps to strengthen the muscles in your thighs and the muscles in the center of your body that give you stability (core muscles). This exercise is especially helpful if:  You have had knee or hip surgery.  You have trouble getting up from a chair, out of a car, or off the toilet. How to do the sit-to-stand exercise 1. Sit  toward the front edge of a sturdy chair without armrests. Your knees should be bent and your feet should be flat on the floor and shoulder-width apart. 2. Place your hands lightly on each side of the seat. Keep your back and neck as straight as possible, with your chest slightly forward. 3. Breathe in slowly. Lean forward and slightly shift your weight to the front of your feet. 4. Breathe out as you slowly stand up. Use your hands as little as possible. 5. Stand and pause for a full breath in and out. 6. Breathe in as you sit down slowly. Tighten your core and abdominal muscles to control your lowering as much as possible. 7. Breathe out slowly. 8. Do this exercise 10-15 times. If needed, do it fewer times until you build up strength. 9. Rest for 1 minute, then do another set of 10-15 repetitions. To change the difficulty of the sit-to-stand exercise  If the exercise is too difficult, use a chair with sturdy armrests, and push off the armrests to help you come to the standing position. You can also use the armrests to help slowly lower yourself back to sitting. As this gets easier, try to use your arms less. You can also place a firm cushion or pillow on the chair to make the surface higher.  If this exercise is too easy, do not use your arms to help raise or lower yourself. You can also wear a weighted vest, use hand weights, increase your repetitions, or try a lower  chair. General tips  You may feel tired when starting an exercise routine. This is normal.  You may have muscle soreness that lasts a few days. This is normal. As you get stronger, you may not feel muscle soreness.  Use smooth, steady movements.  Do not  hold your breath during strength exercises. This can cause unsafe changes in your blood pressure.  Breathe in slowly through your nose, and breathe out slowly through your mouth. Summary  Strengthening your lower body is an important step to help you move safely and  independently.  The sit-to-stand exercise helps strengthen the muscles in your thighs and core.  You should always talk with your health care provider before starting any exercise program, especially if you have had recent surgery. This information is not intended to replace advice given to you by your health care provider. Make sure you discuss any questions you have with your health care provider. Document Revised: 02/05/2018 Document Reviewed: 05/31/2016 Elsevier Patient Education  [place].  Leg Extension (Hamstring)   Sit toward front edge of chair, with leg out straight, heel on floor, toes pointing toward body. Keeping back straight, bend forward at hip, for 30 seconds.   Repeat _3__ times. Repeat with other leg. Do _2__ sessions per day. Variation: Perform from standing position, with support.    Copyright  VHI. All rights reserved.  HIP: Flexion / KNEE: Extension, Straight Leg Raise   Raise leg, keeping knee straight. Perform slowly. _10__ reps per set, 2___ sets per day, 5-7___ days per week    http://gt2.exer.us/372   Copyright  VHI. All rights reserved.     Raise leg until knee is straight. __10_ reps per set, __1_ sets per day, 5-7___ days per week      Wendy Heath, PT Certified Exercise Expert for the Aging Adult  05/28/19 3:30 PM Phone: [phone] Fax: [phone]

## 2019-05-28 NOTE — Patient Instructions (Addendum)
You have a decrease in your memory.  This is called Mild Cognitive Impairment.  It is not Alzheimer Dementia.   The Mild Cognitive Impairment can get worse over time and become dementia.  It is important to take care of your legal paperwork now, in case your thinking and memory get worse.   You should designate someone you trust to be your Akins to handle your health care matters if you could not and some one to be your Financial Power of Attorney to handle your finances if you could not.    Finally, you want to complete a Living Will form to let your health care Power of Attorney and family know what you would want done for your healthcare if you could not speak for yourself.   Complete the Living Will and [place] forms received  during your office visit today.  Your signature on these documents must be notarized to be legal documents.  Once you have notarized the documents, make many copies.  Let everyone know that these documents exist.  Send a copy to your primary care doctor to but in your medical record.   We are checking some labs today, we will call you or send you a letter if they are abnormal.   Take care and seek immediate care sooner if you develop any concerns.   [place]

## 2019-05-29 ENCOUNTER — Encounter: Payer: Self-pay | Admitting: Licensed Clinical Social Worker

## 2019-05-29 ENCOUNTER — Encounter: Payer: Self-pay | Admitting: Family Medicine

## 2019-05-29 LAB — BASIC METABOLIC PANEL
BUN/Creatinine Ratio: 11 — ABNORMAL LOW (ref 12–28)
BUN: 13 mg/dL (ref 8–27)
CO2: 25 mmol/L (ref 20–29)
Calcium: 9.6 mg/dL (ref 8.7–10.3)
Chloride: 107 mmol/L — ABNORMAL HIGH (ref 96–106)
Creatinine, Ser: 1.21 mg/dL — ABNORMAL HIGH (ref 0.57–1.00)
GFR calc Af Amer: 52 mL/min/{1.73_m2} — ABNORMAL LOW (ref >59)
GFR calc non Af Amer: 45 mL/min/{1.73_m2} — ABNORMAL LOW (ref >59)
Glucose: 99 mg/dL (ref 65–99)
Potassium: 4 mmol/L (ref 3.5–5.2)
Sodium: 147 mmol/L — ABNORMAL HIGH (ref 134–144)

## 2019-05-29 LAB — VITAMIN D 25 HYDROXY (VIT D DEFICIENCY, FRACTURES): Vit D, 25-Hydroxy: 49.7 ng/mL (ref 30.0–100.0)

## 2019-05-29 MED ORDER — FAMOTIDINE 20 MG PO TABS: 20.0000 mg | ORAL_TABLET | Freq: Every day | ORAL | 1 refills | Status: DC

## 2019-05-29 MED ORDER — CETIRIZINE HCL 10 MG PO TABS: 10.0000 mg | ORAL_TABLET | Freq: Every day | ORAL | 3 refills | Status: DC

## 2019-05-29 MED ORDER — METFORMIN HCL 500 MG PO TABS: ORAL_TABLET | ORAL | 3 refills | Status: DC

## 2019-05-29 MED ORDER — LISINOPRIL 20 MG PO TABS: 20.0000 mg | ORAL_TABLET | Freq: Every day | ORAL | 3 refills | Status: DC

## 2019-05-29 NOTE — Progress Notes (Addendum)
I have interviewed and examined the patient.  I have discussed the case with Dr. Ky Barban.   I agree with their documentation and management in their geriatric consultation note.   I agree that Ms Bronikowski has evidence c/w Mild Cognitive Impairment given her 21/30 MoCA score and preserved iADL/ADLs. I agree with the recommended non-pharmacologic interventions to delay progression of the impairment.   Recommend periodic review of patient's need for assistance with her iADLs to see if there has been progression in MCI to dementia.   Sleep fragmentation: Avoid daytime naps.  If prn sleep aid needed then trial of trazodone 25-50 mg qhs prn or doxepin 10 mg/ml, 0.5 ml qhs prn.   I agree with Pharmacy recommendations for stopping the Plavix as there is no clear indication for this therapy now.   I agree with treating bilateral osteoarthritis knee pains with PT.    I appreciate Social Work plans to address food insecurity, advanced directives, and transportation barriers to healthcare.    > 60 minutes face to face where spent in total with counseling / coordination of care took more than 30 minutes of the total time. The problem list and recommendations for the patient was discussed among the four disciplines prior to meeting with the patient and their family/friend/caretaker.  Each discipline meet with the patient and family/friend/caretaker to assess the issue particular to their disciplines.   Counseling involved a group meeting of the four disciplines with the patient and their family/friend/caretaker to discuss their finding, and recommendations.  Feedback was elicited from the patient and caretaker as to the findings and recommendations.

## 2019-05-29 NOTE — Patient Instructions (Addendum)
Licensed Clinical Social Worker Visit Information Wendy Heath  it was nice speaking with you. Please call me directly if you have questions [phone] Goals we discussed today:  Goals Addressed            This Visit's Progress   . Food resources       Current Barriers:  . Patient with HTN, DMII, and mild cognitive impairment needs community resources to help supplement monthly food   . Knowledge deficits and needs support, education and care coordination in order to meet this unmet need  . Lacks knowledge of community resource:   Clinical Goal(s)  . Over the next 30 days patient will be able  connected with [employer] [facility] food program as demonstrated by not having concerns of running out of food . Over the next 30 days, patient will follow up with [facility] for monthly food pickup  Interventions provided by LCSW:  . Assessed patient's care coordination needs and discussed ongoing care management follow up  . Provided patient with information about [employer] [facility] food program,  . Assisted patient with completing application, application will be faxed  . Foodbox given to patient today while in office. Patient Self Care Activities & Deficits:  . Patient is unable to independently navigate community resource options without care coordination support  . Acknowledges deficits with food options and is motivated to resolve concern  . Patient is able to get children to take her for monthly food pick up  as discussed today Initial goal documentation    . Needs Advance Directive       Current Barriers:  . Patient with HTN, DMII, and mild cognitive impairment does not have an Advance Directive . Knowledge deficits, education and support in order to complete this document . Patient did not have children's address during office visit today Clinical Social Work Goal(s):  [name] Over the next 30 days, with assistance of LCSW, the patient will complete Advance Directive packet given  today . Over the next 45 days, the patient will  work with care management team on completion of Advance Directive  . Over the next 45 days, the patient will have Advance Directive notarized and provide a copy to provider office Interventions provided by LCSW: . Interviewed patient about Financial controller and provided education about the importance of completing advanced directives . Advised patient to review information mailed by LCSW . A voluntary discussion about advanced care planning including explanation and discussion of advanced directives, healthcare proxy and living will was discussed with the patient and son today.  Patient Self Care Activities:  . Is able to complete documentation independently . Able to identify next of kin or [name] . Patient will get address of children and complete form Initial goal documentation    . Transportation       Current Barriers:  . Patient with HTN, DMII, and mild cognitive impairment  needs transportation to medical appointments  . Knowledge deficits and needs support, education and care coordination in order to meet this unmet need  Clinical Goal(s)  . Over the next 30 days patient will be able to get to all medical appointment as demonstrated by removing transportation barrier . Over the next 10 days, patient will follow up with [facility] Medicare to discuss transportation service  Interventions provided by LCSW:  . Assessed patient's care coordination needs and discussed ongoing care management follow up  . Provided patient with information about transportation via her insurance  provider . Advised patient to contact them for more information and to schedule rides Patient Self Care Activities & Deficits:  . Patient is unable to independently navigate transportation barrier without care coordination support  . Acknowledges deficits and is motivated to resolve concern  . Patient is able to contact her  insurance provider as discussed today . Independent living Initial goal documentation      Materials provided: Advance Directive Wendy Heath was given information about Care Management services today including:  1. Care Management services include personalized support from designated clinical staff supervised by her physician, including individualized plan of care and coordination with other care providers 2. 24/7 contact [phone] for assistance for urgent and routine care needs. 3. Care Management services at any time by phone call to the office staff.  Patient agreed to services and verbal consent obtained.    The patient verbalized understanding of instructions provided today and declined a print copy of patient instruction materials.  Follow up plan: in two weeks  Wendy Cane, LCSW

## 2019-05-29 NOTE — Chronic Care Management (AMB) (Signed)
[facility] Assessment    05/29/2019 Name: Wendy Heath MRN: [id number] DOB: Feb 26, 1951  Referred by: Rory Percy, DO Reason for referral : Care Coordination ([facility] assessment of needs)  Wendy Heath is a 69 y.o. year old female who sees Rory Percy, [place] for primary care.  Referred by Dr. McDiarmid for assessing social needs and support.  Patient  was accompanied by her son today. SDOH (Social Determinants of Health) screening and general assessment of needs completed today.  Therapist, occupational. See Care Plan for related entries.  Advanced Directives: See Care Plan for related entries.   Review of patient status, including review of consultants reports, relevant laboratory and other test results, and collaboration with appropriate care team members and the patient's provider was performed as part of comprehensive patient evaluation and provision of chronic care management services.   Outpatient Encounter Medications as of 05/28/2019  Medication Sig Note  . aspirin 81 MG tablet Take 81 mg by mouth daily.    . cetirizine (ZYRTEC) 10 MG tablet TAKE 1 TABLET EVERY DAY   . cholecalciferol (VITAMIN D) 400 UNITS TABS Take 1,000 Units by mouth daily.    . clopidogrel (PLAVIX) 75 MG tablet TAKE 1 TABLET DAILY (DO NOT TAKE FOR 1 MORE WEEK, THEN RESUME)   . diclofenac sodium (VOLTAREN) 1 % GEL APPLY 2 GM TOPICALLY 4 (FOUR) TIMES DAILY as needed 05/28/2019: Helpful for arm pain but not knee pain  . famotidine (PEPCID) 40 MG tablet TAKE 1 TABLET EVERY DAY   . fluticasone (FLONASE) 50 MCG/ACT nasal spray Place 2 sprays into both nostrils daily. (Patient not taking: Reported on 05/28/2019)   . hydrochlorothiazide (HYDRODIURIL) 12.5 MG tablet TAKE 1 TABLET EVERY DAY (Patient not taking: Reported on 05/28/2019)   . lisinopril (ZESTRIL) 20 MG tablet TAKE 1 TABLET EVERY DAY   . metFORMIN (GLUCOPHAGE) 500 MG tablet TAKE  1 TABLET EVERY DAY WITH BREAKFAST   . rosuvastatin (CRESTOR) 10 MG tablet TAKE 1 TABLET EVERY DAY    No facility-administered encounter medications on file as of 05/28/2019.   Goals Addressed            This Visit's Progress   . Food resources       Current Barriers:  . Patient with HTN, DMII, and mild cognitive impairment needs community resources to help supplement monthly food   . Knowledge deficits and needs support, education and care coordination in order to meet this unmet need  . Lacks knowledge of community resource:   Clinical Goal(s)  . Over the next 30 days patient will be able  connected with [employer] [facility] food program as demonstrated by not having concerns of running out of food . Over the next 30 days, patient will follow up with [facility] for monthly food pickup  Interventions provided by LCSW:  . Assessed patient's care coordination needs and discussed ongoing care management follow up  . Provided patient with information about [employer] [facility] food program,  . Assisted patient with completing application, application will be faxed  . Foodbox given to patient today while in office. Patient Self Care Activities & Deficits:  . Patient is unable to independently navigate community resource options without care coordination support  . Acknowledges deficits with food options and is motivated to resolve concern  . Patient is able to get children to take her for monthly food pick up  as discussed today Initial goal  documentation    . Needs Advance Directive       Current Barriers:  . Patient with HTN, DMII, and mild cognitive impairment does not have an Advance Directive . Knowledge deficits, education and support in order to complete this document . Patient did not have children's address during office visit today Clinical Social Work Goal(s):  [name] Over the next 30 days, with assistance of LCSW, the patient will complete Advance Directive packet given today . Over  the next 45 days, the patient will  work with care management team on completion of Advance Directive  . Over the next 45 days, the patient will have Advance Directive notarized and provide a copy to provider office Interventions provided by LCSW: . Interviewed patient about Financial controller and provided education about the importance of completing advanced directives . Advised patient to review information mailed by LCSW . A voluntary discussion about advanced care planning including explanation and discussion of advanced directives, healthcare proxy and living will was discussed with the patient and son today.  Patient Self Care Activities:  . Is able to complete documentation independently . Able to identify next of kin or [place] . Patient will get address of children and complete form Initial goal documentation    . Transportation       Current Barriers:  . Patient with HTN, DMII, and mild cognitive impairment  needs transportation to medical appointments  . Knowledge deficits and needs support, education and care coordination in order to meet this unmet need  Clinical Goal(s)  . Over the next 30 days patient will be able to get to all medical appointment as demonstrated by removing transportation barrier . Over the next 10 days, patient will follow up with [facility] Medicare to discuss transportation service  Interventions provided by LCSW:  . Assessed patient's care coordination needs and discussed ongoing care management follow up  . Provided patient with information about transportation via her insurance provider . Advised patient to contact them for more information and to schedule rides Patient Self Care Activities & Deficits:  . Patient is unable to independently navigate transportation barrier without care coordination support  . Acknowledges deficits and is motivated to resolve concern  . Patient is able to contact her insurance provider as  discussed today . Independent living Initial goal documentation    Plan:   Patient will :  Call insurance provider to discuss medical transportation   Go to [facility] for next food pick up  Will complete Advance Directive LCSW will:  Fax food application   Follow up with patient via phone in two weeks.  Casimer Lanius, LCSW Clinical Social Worker [place] / [place]   [phone] 10:05 AM

## 2019-06-01 ENCOUNTER — Telehealth: Payer: Self-pay | Admitting: Pharmacist

## 2019-06-01 DIAGNOSIS — G479 Sleep disorder, unspecified: Secondary | ICD-10-CM | POA: Insufficient documentation

## 2019-06-01 NOTE — Telephone Encounter (Signed)
Called patient on 06/01/2019 at 4:27 PM to follow up with patient regarding pharmacy recommendations. Patient states she has not stopped Plavix and she has not picked up new famotidine 20 mg daily prescription (old prescription was 40 mg daily). Reiterated to patient to stop clopidogrel and in the interim time frame she can use famotidine 40 mg every other day until she picks up famotidine 20 mg daily. Patient verbalized understanding. Advised patient to follow up with pharmacy team for any medication related concerns. Patient verbalized understanding.   Drexel Iha, PharmD PGY2 Ambulatory Care Pharmacy Resident

## 2019-06-01 NOTE — Addendum Note (Signed)
Addended byWendy Poet, Antanasia Kaczynski D on: 06/01/2019 11:35 AM   Modules accepted: Orders

## 2019-06-03 ENCOUNTER — Telehealth: Payer: Self-pay

## 2019-06-03 DIAGNOSIS — Z9181 History of falling: Secondary | ICD-10-CM | POA: Diagnosis not present

## 2019-06-03 DIAGNOSIS — M25561 Pain in right knee: Secondary | ICD-10-CM | POA: Diagnosis not present

## 2019-06-03 DIAGNOSIS — G8929 Other chronic pain: Secondary | ICD-10-CM | POA: Diagnosis not present

## 2019-06-03 DIAGNOSIS — Z8679 Personal history of other diseases of the circulatory system: Secondary | ICD-10-CM | POA: Diagnosis not present

## 2019-06-03 DIAGNOSIS — E1169 Type 2 diabetes mellitus with other specified complication: Secondary | ICD-10-CM | POA: Diagnosis not present

## 2019-06-03 DIAGNOSIS — M25562 Pain in left knee: Secondary | ICD-10-CM | POA: Diagnosis not present

## 2019-06-03 NOTE — Telephone Encounter (Signed)
Betsy from [facility] calls nurse line regarding verbal orders for continuation of PT. Please call in verbal orders for the following: - twice weekly for two weeks -once weekly for one week   Orders can be called in to [phone]  Talbot Grumbling, RN

## 2019-06-04 NOTE — Telephone Encounter (Signed)
Verbal orders approved  Caroline More, DO, PGY-3 [facility] 06/04/2019 12:00 PM

## 2019-06-08 DIAGNOSIS — G8929 Other chronic pain: Secondary | ICD-10-CM | POA: Diagnosis not present

## 2019-06-08 DIAGNOSIS — E1169 Type 2 diabetes mellitus with other specified complication: Secondary | ICD-10-CM | POA: Diagnosis not present

## 2019-06-08 DIAGNOSIS — M25561 Pain in right knee: Secondary | ICD-10-CM | POA: Diagnosis not present

## 2019-06-08 DIAGNOSIS — Z8679 Personal history of other diseases of the circulatory system: Secondary | ICD-10-CM | POA: Diagnosis not present

## 2019-06-08 DIAGNOSIS — M25562 Pain in left knee: Secondary | ICD-10-CM | POA: Diagnosis not present

## 2019-06-08 DIAGNOSIS — Z9181 History of falling: Secondary | ICD-10-CM | POA: Diagnosis not present

## 2019-06-12 ENCOUNTER — Other Ambulatory Visit: Payer: Self-pay

## 2019-06-12 ENCOUNTER — Ambulatory Visit: Payer: Self-pay | Admitting: Licensed Clinical Social Worker

## 2019-06-12 ENCOUNTER — Telehealth: Payer: Medicare Other

## 2019-06-12 NOTE — Chronic Care Management (AMB) (Signed)
  Social Work Care Management  Unsuccessful Phone Outreach   06/12/2019 Name: Wendy Heath MRN: [id number] DOB: 11/29/1950  Referred by: Rory Percy, DO,  And Dr. McDiarmid Reason for referral : Care Coordination (F/U from [facility])   [name] is a 69 y.o. year old female who sees Rory Percy, [place] for primary care.   Called patient to assess needs and barriers reference the above referral. Telephone outreach was unsuccessful. A HIPPA compliant phone message was left for the patient providing contact information and requesting a return call.  Plan:  Will attempt to call patient again prior to the end of the day, If no return call is received or unable to reach patient. LCSW will call again in 3 to 5 days.  Casimer Lanius, LCSW Clinical Social Worker [place] / [place]   [phone] 10:45 AM

## 2019-06-15 DIAGNOSIS — Z9181 History of falling: Secondary | ICD-10-CM | POA: Diagnosis not present

## 2019-06-15 DIAGNOSIS — E1169 Type 2 diabetes mellitus with other specified complication: Secondary | ICD-10-CM | POA: Diagnosis not present

## 2019-06-15 DIAGNOSIS — M25562 Pain in left knee: Secondary | ICD-10-CM | POA: Diagnosis not present

## 2019-06-15 DIAGNOSIS — M25561 Pain in right knee: Secondary | ICD-10-CM | POA: Diagnosis not present

## 2019-06-15 DIAGNOSIS — Z8679 Personal history of other diseases of the circulatory system: Secondary | ICD-10-CM | POA: Diagnosis not present

## 2019-06-15 DIAGNOSIS — G8929 Other chronic pain: Secondary | ICD-10-CM | POA: Diagnosis not present

## 2019-06-17 DIAGNOSIS — M25562 Pain in left knee: Secondary | ICD-10-CM | POA: Diagnosis not present

## 2019-06-17 DIAGNOSIS — E1169 Type 2 diabetes mellitus with other specified complication: Secondary | ICD-10-CM | POA: Diagnosis not present

## 2019-06-17 DIAGNOSIS — Z9181 History of falling: Secondary | ICD-10-CM | POA: Diagnosis not present

## 2019-06-17 DIAGNOSIS — Z8679 Personal history of other diseases of the circulatory system: Secondary | ICD-10-CM | POA: Diagnosis not present

## 2019-06-17 DIAGNOSIS — M25561 Pain in right knee: Secondary | ICD-10-CM | POA: Diagnosis not present

## 2019-06-17 DIAGNOSIS — G8929 Other chronic pain: Secondary | ICD-10-CM | POA: Diagnosis not present

## 2019-06-18 ENCOUNTER — Ambulatory Visit: Payer: Self-pay | Admitting: Licensed Clinical Social Worker

## 2019-06-18 DIAGNOSIS — Z139 Encounter for screening, unspecified: Secondary | ICD-10-CM

## 2019-06-18 DIAGNOSIS — Z789 Other specified health status: Secondary | ICD-10-CM

## 2019-06-18 DIAGNOSIS — I152 Hypertension secondary to endocrine disorders: Secondary | ICD-10-CM

## 2019-06-18 DIAGNOSIS — E1159 Type 2 diabetes mellitus with other circulatory complications: Secondary | ICD-10-CM

## 2019-06-18 NOTE — Chronic Care Management (AMB) (Signed)
Care Management   Clinical Social Work Follow Up   06/18/2019 Name: Cabela Gummer MRN: [id number] DOB: 1950-08-18  Referred by: Rory Percy, DO  Reason for referral : Care Coordination (F/U call )  Rehanna Willborn is a 69 y.o. year old female who is a primary care patient of Rory Percy, DO.  Reason for follow-up: Phone encounter with patient today for ongoing assessment and brief interventions to assist with care coordination needs.   Patient reports no needs of concerns.  Discussed barriers with food delivery.  Review of patient status, including review of consultants reports, relevant laboratory and other test results, and collaboration with appropriate care team members and the patient's provider was performed as part of comprehensive patient evaluation and provision of care management services.    Advance Directive Status: N See Care Plan and Vynca application for related entries. SDOH (Social Determinants of Health) assessments performed: Yes SDOH Interventions     Most Recent Value  SDOH Interventions  Food Insecurity Interventions  Other (Comment) [facility] Food box program]  Transportation Interventions  Other (Comment) [transportation with [employer] Medicare]      Goals Addressed            This Visit's Progress   . Food resources   On track    CARE PLAN ENTRY (see longtitudinal plan of care for additional care plan information)  Current Barriers & Progress:  . Patient with HTN, DMII, and mild cognitive impairment needs community resources to help supplement monthly food   . Knowledge deficits and needs support, education and care coordination in order to meet this unmet need  . Lacks knowledge of community resource:   . Patient has connected with Manuela Schwartz at [employer], however food was not delivered Clinical Goal(s)  . Over the next 30 days patient will be able  connected with [employer] further food program as demonstrated by not having concerns of  running out of food . Over the next 30 days, patient will follow up with [facility] [employer] [facility] for monthly food delivery  Interventions provided by LCSW:  . Assessed patient's care coordination needs and barriers with getting next food pick up . Collaborated with Foodbox program for barriers with food delivery . Provided patient with contact phone number to call about food Patient Self Care Activities & Deficits:  . Patient is unable to independently navigate community resource options without care coordination support  . Patient is able to call Manuela Schwartz with questions about food pickup Please see past updates related to this goal by clicking on the "Past Updates" button in the selected goal      . Needs Advance Directive   Not on track    Current Barriers:  . Patient with HTN, DMII, and mild cognitive impairment does not have an Advance Directive . Knowledge deficits, education and support in order to complete this document . Patient did not have children's address during office visit today Clinical Social Work Goal(s):  [name] Over the next 30 days, with assistance of LCSW, the patient will complete Advance Directive packet given today . Over the next 45 days, the patient will  work with care management team on completion of Advance Directive  . Over the next 45 days, the patient will have Advance Directive notarized and provide a copy to provider office Interventions provided by LCSW: . Interviewed patient about Financial controller and provided education about the importance of completing advanced directives . Advised patient to review information mailed by LCSW . A voluntary  discussion about advanced care planning including explanation and discussion of advanced directives, healthcare proxy and living will was discussed with the patient and son today.  Patient Self Care Activities:  . Is able to complete documentation independently . Able to identify next of kin or [place] . Patient will get address of children and complete form Initial goal documentation    . Transportation   On track    Current Barriers:  . Patient with HTN, DMII, and mild cognitive impairment  needs transportation to medical appointments  . Knowledge deficits and needs support, education and care coordination in order to meet this unmet need  Clinical Goal(s)  . Over the next 30 days patient will be able to get to all medical appointment as demonstrated by removing transportation barrier . Over the next 10 days, patient will follow up with [facility] Medicare to discuss transportation service  Interventions provided by LCSW:  . Assessed patient's care coordination needs and discussed ongoing care management follow up  . Provided patient with information about transportation via her insurance provider . Advised patient to contact them for more information and to schedule rides Patient Self Care Activities & Deficits:  . Patient is unable to independently navigate transportation barrier without care coordination support  . Acknowledges deficits and is motivated to resolve concern  . Patient is able to contact her insurance provider as discussed today . Independent living Initial goal documentation      Outpatient Encounter Medications as of 06/18/2019  Medication Sig Note  . aspirin 81 MG tablet Take 81 mg by mouth daily.    . cetirizine (ZYRTEC) 10 MG tablet Take 1 tablet (10 mg total) by mouth daily.   . cholecalciferol (VITAMIN D) 400 UNITS TABS Take 1,000 Units by mouth daily.    . diclofenac sodium (VOLTAREN) 1 % GEL APPLY 2 GM TOPICALLY 4 (FOUR) TIMES DAILY as needed 05/28/2019: Helpful for arm pain but not knee pain  . famotidine (PEPCID) 20 MG tablet Take 1 tablet (20 mg total) by mouth daily.   . fluticasone (FLONASE) 50 MCG/ACT nasal spray Place 2 sprays into both nostrils daily. (Patient not taking: Reported on 05/28/2019)   . hydrochlorothiazide (HYDRODIURIL)  12.5 MG tablet TAKE 1 TABLET EVERY DAY (Patient not taking: Reported on 05/28/2019)   . lisinopril (ZESTRIL) 20 MG tablet Take 1 tablet (20 mg total) by mouth daily.   . metFORMIN (GLUCOPHAGE) 500 MG tablet TAKE 1 TABLET EVERY DAY WITH BREAKFAST   . rosuvastatin (CRESTOR) 10 MG tablet TAKE 1 TABLET EVERY DAY    No facility-administered encounter medications on file as of 06/18/2019.   Plan: LCSW will  1. follow up with Food box program to make sure patient is on the delivery list 2. Follow up with patient in 3 to 4 weeks 3. Patient will call office if need prior to LCSW reaching out to her  Casimer Lanius, [place] / [place]   [phone] 4:27 PM

## 2019-06-18 NOTE — Patient Instructions (Signed)
Licensed Clinical Social Worker Visit Information Ms. Mirante  it was nice speaking with you. Please call me directly if you have questions [phone] Goals we discussed today:  Goals Addressed            This Visit's Progress   . Food resources   On track    CARE PLAN ENTRY (see longtitudinal plan of care for additional care plan information)  Current Barriers & Progress:  . Patient with HTN, DMII, and mild cognitive impairment needs community resources to help supplement monthly food   . Knowledge deficits and needs support, education and care coordination in order to meet this unmet need  . Lacks knowledge of community resource:   . Patient has connected with Manuela Schwartz at [employer], however food was not delivered Clinical Goal(s)  . Over the next 30 days patient will be able  connected with [employer] further food program as demonstrated by not having concerns of running out of food . Over the next 30 days, patient will follow up with [facility] for monthly food delivery  Interventions provided by LCSW:  . Assessed patient's care coordination needs and barriers with getting next food pick up . Collaborated with [facility] program for barriers with food delivery . Provided patient with contact phone number to call about food Patient Self Care Activities & Deficits:  . Patient is unable to independently navigate community resource options without care coordination support  . Patient is able to call Manuela Schwartz with questions about food pickup Please see past updates related to this goal by clicking on the "Past Updates" button in the selected goal      . Needs Advance Directive   Not on track    Current Barriers:  . Patient with HTN, DMII, and mild cognitive impairment does not have an Advance Directive . Knowledge deficits, education and support in order to complete this document . Patient did not have children's address during office visit today Clinical Social Work Goal(s):  [name] Over  the next 30 days, with assistance of LCSW, the patient will complete Advance Directive packet given today . Over the next 45 days, the patient will  work with care management team on completion of Advance Directive  . Over the next 45 days, the patient will have Advance Directive notarized and provide a copy to provider office Interventions provided by LCSW: . Interviewed patient about Financial controller and provided education about the importance of completing advanced directives . Advised patient to review information mailed by LCSW . A voluntary discussion about advanced care planning including explanation and discussion of advanced directives, healthcare proxy and living will was discussed with the patient and son today.  Patient Self Care Activities:  . Is able to complete documentation independently . Able to identify next of kin or [place] . Patient will get address of children and complete form Initial goal documentation    . Transportation   On track    Current Barriers:  . Patient with HTN, DMII, and mild cognitive impairment  needs transportation to medical appointments  . Knowledge deficits and needs support, education and care coordination in order to meet this unmet need  Clinical Goal(s)  . Over the next 30 days patient will be able to get to all medical appointment as demonstrated by removing transportation barrier . Over the next 10 days, patient will follow up with [facility] Medicare to discuss transportation service  Interventions provided by LCSW:  . Assessed patient's  care coordination needs and discussed ongoing care management follow up  . Provided patient with information about transportation via her insurance provider . Advised patient to contact them for more information and to schedule rides Patient Self Care Activities & Deficits:  . Patient is unable to independently navigate transportation barrier without care coordination  support  . Acknowledges deficits and is motivated to resolve concern  . Patient is able to contact her insurance provider as discussed today . Independent living Initial goal documentation      Materials provided: Verbal education about food program  provided by phone Ms. [facility] received Care Management services today:  1. Care Management services include personalized support from designated clinical staff supervised by her physician, including individualized plan of care and coordination with other care providers 2. 24/7 contact [phone] for assistance for urgent and routine care needs. 3. Care Management are voluntary services and be declined at any time by calling the office.  Patient  verbally agreed to assistance and services provided by embedded care coordination/care management team today.   Patient verbalizes understanding of instructions provided today.  Follow up plan:  SW will follow up with patient by phone over the next 3 to 4 weeks  Maurine Cane, LCSW

## 2019-06-22 ENCOUNTER — Telehealth: Payer: Self-pay | Admitting: *Deleted

## 2019-06-22 DIAGNOSIS — G8929 Other chronic pain: Secondary | ICD-10-CM | POA: Diagnosis not present

## 2019-06-22 DIAGNOSIS — M25562 Pain in left knee: Secondary | ICD-10-CM | POA: Diagnosis not present

## 2019-06-22 DIAGNOSIS — M25561 Pain in right knee: Secondary | ICD-10-CM | POA: Diagnosis not present

## 2019-06-22 DIAGNOSIS — E1169 Type 2 diabetes mellitus with other specified complication: Secondary | ICD-10-CM | POA: Diagnosis not present

## 2019-06-22 DIAGNOSIS — Z8679 Personal history of other diseases of the circulatory system: Secondary | ICD-10-CM | POA: Diagnosis not present

## 2019-06-22 DIAGNOSIS — Z9181 History of falling: Secondary | ICD-10-CM | POA: Diagnosis not present

## 2019-06-22 NOTE — Telephone Encounter (Signed)
Wendy Heath and wanted to call and let PCP know that pts BP was 150/110 today but she was asymptomatic.   Upon recheck "systolic went down, but diastolic stayed @ [id number]"  Wendy advised pt to go to ED if she started to have chest pain, arm pain, nausea, or SOB.  [name], CMA

## 2019-06-23 ENCOUNTER — Other Ambulatory Visit: Payer: Self-pay

## 2019-06-23 MED ORDER — HYDROCHLOROTHIAZIDE 12.5 MG PO TABS: 12.5000 mg | ORAL_TABLET | Freq: Every day | ORAL | 1 refills | Status: DC

## 2019-06-23 MED ORDER — ROSUVASTATIN CALCIUM 10 MG PO TABS: 10.0000 mg | ORAL_TABLET | Freq: Every day | ORAL | 3 refills | Status: DC

## 2019-06-23 MED ORDER — FAMOTIDINE 20 MG PO TABS: 20.0000 mg | ORAL_TABLET | Freq: Every day | ORAL | 1 refills | Status: DC

## 2019-06-23 MED ORDER — FLUTICASONE PROPIONATE 50 MCG/ACT NA SUSP: 2.0000 | Freq: Every day | NASAL | 0 refills | Status: DC

## 2019-06-23 MED ORDER — METFORMIN HCL 500 MG PO TABS: ORAL_TABLET | ORAL | 1 refills | Status: DC

## 2019-06-23 MED ORDER — LISINOPRIL 20 MG PO TABS: 20.0000 mg | ORAL_TABLET | Freq: Every day | ORAL | 1 refills | Status: DC

## 2019-06-23 NOTE — Telephone Encounter (Signed)
Patient calls nurse line stating her insurance is now requiring her to use [facility]. I have pended all of her medications for you to [facility].

## 2019-06-24 ENCOUNTER — Ambulatory Visit
Admission: RE | Admit: 2019-06-24 | Discharge: 2019-06-24 | Disposition: A | Discharge status: Home / Self Care | Payer: Medicare Other | Source: Ambulatory Visit | Attending: Family Medicine | Admitting: Family Medicine

## 2019-06-24 ENCOUNTER — Other Ambulatory Visit: Payer: Self-pay

## 2019-06-24 DIAGNOSIS — R922 Inconclusive mammogram: Secondary | ICD-10-CM | POA: Diagnosis not present

## 2019-06-24 DIAGNOSIS — R921 Mammographic calcification found on diagnostic imaging of breast: Secondary | ICD-10-CM

## 2019-07-01 ENCOUNTER — Telehealth: Payer: Self-pay | Admitting: *Deleted

## 2019-07-01 NOTE — Telephone Encounter (Signed)
Pt reluctant, but agreeable. [name], CMA

## 2019-07-01 NOTE — Telephone Encounter (Signed)
Would prefer her to have an in person visit so we can do an exam and collect sample but if she is adverse, recommend she keep the virtual appointment. Thanks!

## 2019-07-01 NOTE — Telephone Encounter (Signed)
Pt calls because she is having a vaginal discharge and itching and would like something called in.  When offered an in-person appt she strongly prefers not to, but is agreeable to a virtual if nothing could be called in over the phone.  Virtual (telephone) appt made for tomorrow @ 4:10 pm.    Advised pt that I would send a message to PCP and one of three things will happen:  1. She will send script w/o appt - pt is aware this is unlikely 2. She will need to keep virtual 3. PCP would prefer she have an in-office visit.  Pt informed that we would call her back with next steps. [name], CMA

## 2019-07-02 ENCOUNTER — Ambulatory Visit (INDEPENDENT_AMBULATORY_CARE_PROVIDER_SITE_OTHER): Payer: Medicare Other | Admitting: Family Medicine

## 2019-07-02 ENCOUNTER — Other Ambulatory Visit: Payer: Self-pay

## 2019-07-02 ENCOUNTER — Encounter: Payer: Self-pay | Admitting: Family Medicine

## 2019-07-02 VITALS — BP 118/70 | HR 71 | Wt 189.0 lb

## 2019-07-02 DIAGNOSIS — N898 Other specified noninflammatory disorders of vagina: Secondary | ICD-10-CM | POA: Insufficient documentation

## 2019-07-02 LAB — POCT URINALYSIS DIP (MANUAL ENTRY)
Glucose, UA: NEGATIVE mg/dL
Ketones, POC UA: NEGATIVE mg/dL
Nitrite, UA: NEGATIVE
Protein Ur, POC: 100 mg/dL — AB
Spec Grav, UA: >=1.03 — AB (ref 1.010–1.025)
Urobilinogen, UA: 1 E.U./dL
pH, UA: 5.5 (ref 5.0–8.0)

## 2019-07-02 LAB — POCT WET PREP (WET MOUNT)
Clue Cells Wet Prep Whiff POC: NEGATIVE
Trichomonas Wet Prep HPF POC: ABSENT

## 2019-07-02 NOTE — Progress Notes (Signed)
    SUBJECTIVE:   CHIEF COMPLAINT: vaginal irritation  HPI:   VAGINAL IRRITATION  Having vaginal irritation and itching for 2 days. No abnormal vaginal discharge. Medications tried: monistat without relief  Sex in last month: yes Possible STD exposure: no concerns, wears condoms  Symptoms Fever: no Dysuria: no, some slight increased urinary frequency Vaginal bleeding: no Abdomen or Pelvic pain: no Back pain: no Genital sores or ulcers:no Rash: no Missed menstrual period: post-menopausal. Denies bleeding.  PERTINENT  PMH / PSH: HTN, T2DM, HLD, GERD, obesity, H/o CVA, blind in right eye, seasonal allergies, mild cognitive impairment  OBJECTIVE:   BP 118/70   Pulse 71   Wt 189 lb (85.7 kg)   SpO2 100%   BMI 33.48 kg/m   Gen: elderly, well appearing, in NAD GYN:  External genitalia within normal limits.  Vaginal mucosa pink, moist, normal rugae.  Slightly irritated and erythematous urethral meatus. Yellowish discharge on vaginal swab.   ASSESSMENT/PLAN:   Vaginal irritation Wet prep and UA not consistent with infection. Normal GYN exam today without rashes. Recommended vaginal hygiene without harsh soaps or detergents, cotton underwear. If patient continues to have symptoms, could consider estrogen cream as atrophy may contribute to symptoms.     Rory Percy, [place]

## 2019-07-02 NOTE — Assessment & Plan Note (Addendum)
Wet prep and UA not consistent with infection. Normal GYN exam today without rashes. Recommended vaginal hygiene without harsh soaps or detergents, cotton underwear. If patient continues to have symptoms, could consider estrogen cream as atrophy may contribute to symptoms.

## 2019-07-03 ENCOUNTER — Encounter: Payer: Self-pay | Admitting: Family Medicine

## 2019-07-08 ENCOUNTER — Ambulatory Visit: Payer: Medicare Other | Admitting: Licensed Clinical Social Worker

## 2019-07-08 ENCOUNTER — Other Ambulatory Visit: Payer: Self-pay

## 2019-07-08 DIAGNOSIS — Z139 Encounter for screening, unspecified: Secondary | ICD-10-CM

## 2019-07-08 DIAGNOSIS — E119 Type 2 diabetes mellitus without complications: Secondary | ICD-10-CM

## 2019-07-08 DIAGNOSIS — Z7189 Other specified counseling: Secondary | ICD-10-CM

## 2019-07-08 NOTE — Chronic Care Management (AMB) (Signed)
Care Management   Clinical Social Work Follow Up   07/08/2019 Name: Wendy Heath MRN: [id number] DOB: 1950-12-16  Referred by: Rory Percy, DO  Reason for referral : Care Coordination (monthly check in)  Wendy Heath is a 69 y.o. year old female who is a primary care patient of Rory Percy, DO.  Reason for follow-up: Phone encounter with patient today for ongoing assessment and brief interventions to assist with care coordination needs.   Assessment:  Patient is making progress towards goals. See care plan.  Review of patient status, including review of consultants reports, relevant laboratory and other test results, and collaboration with appropriate care team members and the patient's provider was performed as part of comprehensive patient evaluation and provision of care management services.    Advance Directive Status: N See Care Plan for related entries. SDOH (Social Determinants of Health) assessments performed: Yes no new needs identified.    Goals Addressed            This Visit's Progress   . COMPLETED: Food resources       CARE PLAN ENTRY (see longtitudinal plan of care for additional care plan information)  Current Barriers & Progress:  . Patient with HTN, DMII, and mild cognitive impairment needs community resources to help supplement monthly food   . Knowledge deficits and needs support, education and care coordination in order to meet this unmet need  . Lacks knowledge of community resource:   . Patient has connected with Manuela Schwartz at [employer], Food delivered twice this month. Clinical Goal(s)  . Over the next 30 days patient will be able  connected with [employer] further food program as demonstrated by not having concerns of running out of food . Over the next 30 days, patient will follow up with [facility] [employer] [facility] for monthly food delivery  Interventions provided by LCSW:  . Assessed patient's care coordination needs and barriers with  getting next food pick up . Collaborated with [employer] program for barriers with food delivery . Provided patient with contact phone number to call about food Patient Self Care Activities & Deficits:  . Patient is unable to independently navigate community resource options without care coordination support  . Patient is able to call Manuela Schwartz with questions about food pickup Please see past updates related to this goal by clicking on the "Past Updates" button in the selected goal      . Needs Advance Directive   On track    CARE PLAN ENTRY (see longtitudinal plan of care for additional care plan information)  Current Barriers:  . Patient with HTN, DMII, and mild cognitive impairment does not have an Advance Directive . Knowledge deficits, education and support in order to complete this document . Patient did not have children's address during office visit today . Patient has spoken with children but have not complete the papers. Clinical Social Work [employer]):  [name] Over the next 45 days, the patient will have Advance Directive notarized and provide a copy to provider office Interventions provided by LCSW: . Answered question about Advanced Directives  . Advised patient to completed advance directives and bring to office to have notarized  Patient Self Care Activities:  . Is able to complete documentation independently . Able to identify [place] or [place] . Will bring papers to have notarized at next office visit  Please see past updates related to this goal by clicking on the "Past Updates" button in the selected goal      .  Transportation   On track    [place] (see longtitudinal plan of care for additional care plan information)  Current Barriers & Progress:  . Patient with HTN, DMII, and mild cognitive impairment  needs transportation to medical appointments  . Knowledge deficits and needs support, education and care coordination in order to meet this  unmet need  . Patient has not called [employer] to establish for transportation services Clinical Goal(s)  . Over the next 30 days patient will be able to get to all medical appointment as demonstrated by removing transportation barrier Interventions provided by LCSW:  . Assessed patient's care coordination needs and discussed ongoing care management follow up  . Provided patient with information about transportation via her insurance provider as  . Called [employer] with patient on phone to show her how to call and schedule transportation Patient Self Care Activities & Deficits:  . Patient is unable to independently navigate transportation barrier without care coordination support  . Acknowledges deficits and is motivated to resolve concern  . Patient is able to contact her insurance provider as discussed today after she schedules her medical appointments Please see past updates related to this goal by clicking on the "Past Updates" button in the selected goal        Outpatient Encounter Medications as of 07/08/2019  Medication Sig Note  . aspirin 81 MG tablet Take 81 mg by mouth daily.    . cetirizine (ZYRTEC) 10 MG tablet Take 1 tablet (10 mg total) by mouth daily.   . cholecalciferol (VITAMIN D) 400 UNITS TABS Take 1,000 Units by mouth daily.    . diclofenac sodium (VOLTAREN) 1 % GEL APPLY 2 GM TOPICALLY 4 (FOUR) TIMES DAILY as needed 05/28/2019: Helpful for arm pain but not knee pain  . famotidine (PEPCID) 20 MG tablet Take 1 tablet (20 mg total) by mouth daily.   . fluticasone (FLONASE) 50 MCG/ACT nasal spray Place 2 sprays into both nostrils daily.   . hydrochlorothiazide (HYDRODIURIL) 12.5 MG tablet Take 1 tablet (12.5 mg total) by mouth daily.   [name] lisinopril (ZESTRIL) 20 MG tablet Take 1 tablet (20 mg total) by mouth daily.   . metFORMIN (GLUCOPHAGE) 500 MG tablet TAKE 1 TABLET EVERY DAY WITH BREAKFAST   . rosuvastatin (CRESTOR) 10 MG tablet Take 1 tablet (10 mg total) by mouth daily.    No  facility-administered encounter medications on file as of 07/08/2019.   Plan:  1. Patient will call [employer] to schedule transportation after she schedules her next medical appointment 2. LCSW will F/U with patient in 4 to 5 weeks  Casimer Lanius, [place] / [place]   [phone] 2:10 PM

## 2019-07-08 NOTE — Patient Instructions (Signed)
Licensed Clinical Social Worker Visit Information Ms. Kingcade  it was nice speaking with you. Please call me directly if you have questions [phone] Goals we discussed today:  Goals Addressed            This Visit's Progress   . COMPLETED: Food resources       CARE PLAN ENTRY (see longtitudinal plan of care for additional care plan information)  Current Barriers & Progress:  . Patient with HTN, DMII, and mild cognitive impairment needs community resources to help supplement monthly food   . Knowledge deficits and needs support, education and care coordination in order to meet this unmet need  . Lacks knowledge of community resource:   . Patient has connected with Manuela Schwartz at [employer], Food delivered twice this month. Clinical Goal(s)  . Over the next 30 days patient will be able  connected with [employer] [facility] food program as demonstrated by not having concerns of running out of food . Over the next 30 days, patient will follow up with [facility] for monthly food delivery  Interventions provided by LCSW:  . Assessed patient's care coordination needs and barriers with getting next food pick up . Collaborated with [employer] program for barriers with food delivery . Provided patient with contact phone number to call about food Patient Self Care Activities & Deficits:  . Patient is unable to independently navigate community resource options without care coordination support  . Patient is able to call Manuela Schwartz with questions about food pickup Please see past updates related to this goal by clicking on the "Past Updates" button in the selected goal      . Needs Advance Directive   On track    CARE PLAN ENTRY (see longtitudinal plan of care for additional care plan information)  Current Barriers:  . Patient with HTN, DMII, and mild cognitive impairment does not have an Advance Directive . Knowledge deficits, education and support in order to complete this document . Patient did not  have children's address during office visit today . Patient has spoken with children but have not complete the papers. Clinical Social Work [employer]):  [name] Over the next 45 days, the patient will have Advance Directive notarized and provide a copy to provider office Interventions provided by LCSW: . Answered question about Advanced Directives  . Advised patient to completed advance directives and bring to office to have notarized  Patient Self Care Activities:  . Is able to complete documentation independently . Able to identify [place] or [place] . Will bring papers to have notarized at next office visit  Please see past updates related to this goal by clicking on the "Past Updates" button in the selected goal      . Transportation   On track    [place] (see longtitudinal plan of care for additional care plan information)  Current Barriers & Progress:  . Patient with HTN, DMII, and mild cognitive impairment  needs transportation to medical appointments  . Knowledge deficits and needs support, education and care coordination in order to meet this unmet need  . Patient has not called [employer] to establish for transportation services Clinical Goal(s)  . Over the next 30 days patient will be able to get to all medical appointment as demonstrated by removing transportation barrier Interventions provided by LCSW:  . Assessed patient's care coordination needs and discussed ongoing care management follow up  . Provided patient with information about transportation via her  insurance provider as  . Called [employer] with patient on phone to show her how to call and schedule transportation Patient Self Care Activities & Deficits:  . Patient is unable to independently navigate transportation barrier without care coordination support  . Acknowledges deficits and is motivated to resolve concern  . Patient is able to contact her insurance provider as discussed today after she  schedules her medical appointments Please see past updates related to this goal by clicking on the "Past Updates" button in the selected goal       Materials provided:  Ms. Kucinski received Care Management services today:  1. Care Management services include personalized support from designated clinical staff supervised by her physician, including individualized plan of care and coordination with other care providers 2. 24/7 contact [phone] for assistance for urgent and routine care needs. 3. Care Management are voluntary services and be declined at any time by calling the office. Patient  verbally agreed to assistance and services provided by embedded care coordination/care management team today.   Patient verbalizes understanding of instructions provided today.  Follow up plan:  SW will follow up with patient by phone over the next 4 to 5 weeks  Maurine Cane, LCSW

## 2019-07-16 ENCOUNTER — Telehealth: Payer: Self-pay | Admitting: *Deleted

## 2019-07-16 ENCOUNTER — Encounter: Payer: Self-pay | Admitting: Family Medicine

## 2019-07-16 NOTE — Telephone Encounter (Signed)
Pt calling in to see if dr Tarry Kos would write her a note for her apartment complex, so that she can get a 1st floor apartment without having to pay for one. She can walk up stairs that well. Please advise. Chantell Kunkler Kennon Holter, CMA

## 2019-07-16 NOTE — Telephone Encounter (Signed)
Letter sent to patient's MyChart 

## 2019-07-30 ENCOUNTER — Telehealth: Payer: Self-pay

## 2019-07-30 NOTE — Telephone Encounter (Signed)
The patient calls nurse line stating she is still taking Zyrtec daily, however her eyes are very itchy and swollen. Patient stated she even took a Benadryl. Patient is wondering what else she can take or should she double up on Zyrtec? Please advise.

## 2019-07-30 NOTE — Telephone Encounter (Signed)
Patient informed and will pick up medication from the pharmacy.  She will give it a couple of days to see if she can get some relief.  If not, then she will call us on Monday to be seen.  Jonie Burdell,CMA

## 2019-07-30 NOTE — Telephone Encounter (Signed)
Please let patient know she can try switching to claritin or allegra to see if this helps. Saline eye drops would also be helpful to lubricate her eyes as they may be dry. If she is having vision changes with this though, she should be seen.

## 2019-07-31 ENCOUNTER — Ambulatory Visit: Payer: Self-pay | Admitting: Licensed Clinical Social Worker

## 2019-07-31 ENCOUNTER — Telehealth: Payer: Self-pay | Admitting: Family Medicine

## 2019-07-31 DIAGNOSIS — Z748 Other problems related to care provider dependency: Secondary | ICD-10-CM

## 2019-07-31 NOTE — Telephone Encounter (Signed)
° °  SF 07/31/2019    Name: Wendy Heath    MRN: [id number]    DOB: 09/26/1950    AGE: 69 y.o.    GENDER: female    PCP Rory Percy, DO.   Called pt regarding [employer] Referral for transportation. Informed patient that she will be receiving transportation services from [facility] transportation for her appointment for Monday, August 03, 2019 to the [place] and for Thursday August 06, 2019 to [facility] in [place]. Also informed patient that she will be receiving a call from [place] to verify her information. Patient stated understanding. No additional needs at this time.    [place], Care Management Phone: [phone] Email: [email or web address]

## 2019-07-31 NOTE — Patient Instructions (Signed)
Licensed Clinical Social Worker Visit Information Wendy Heath  it was nice speaking with you. Please call me directly if you have questions [phone] Goals we discussed today: transportation to your next two appointments Goals Addressed            This Visit's Progress   . Transportation   On track    [place] (see longtitudinal plan of care for additional care plan information)  Current Barriers & Progress:  . Patient with HTN, DMII, and mild cognitive impairment  needs transportation to medical appointments  . Knowledge deficits and needs support, education and care coordination in order to meet this unmet need  . Patient called [employer] to schedule transportation to up coming appointments . Ran into barriers with [employer] unable to transport to appointment April 12th  Clinical Goal(s)  . Over the next 30 days patient will be able to get to all medical appointment as demonstrated by removing transportation barrier Interventions provided by LCSW:  . Assessed patient's care coordination needs and barriers . Provided patient with information about transportation via her insurance provider . Made referral to Care Guide to assist with transportation to Medical appointments April 12th to [place] at 2:00 and [facility]. Patient Self Care Activities & Deficits:  . Patient is unable to independently navigate transportation barrier without care coordination support  . Acknowledges deficits and is motivated to resolve concern  . Patient is able to contact her insurance provider however ran into barriers Please see past updates related to this goal by clicking on the "Past Updates" button in the selected goal        Materials provided: [name] received Care Management services today:  1. Care Management services include personalized support from designated clinical staff supervised by her physician, including individualized plan of care and coordination  with other care providers 2. 24/7 contact [phone] for assistance for urgent and routine care needs. 3. Care Management are voluntary services and be declined at any time by calling the office.    Patient verbalizes understanding of instructions provided today.  Follow up plan:  SW will follow up with patient by phone over the next 2 to 4 days  Maurine Cane, LCSW

## 2019-07-31 NOTE — Chronic Care Management (AMB) (Signed)
Care Management   Clinical Social Work Follow Up   07/31/2019 Name: Wendy Heath MRN: [id number] DOB: November 15, 1950  Referred by: Rory Percy, DO  Reason for referral : Care Coordination (transporation )  Wendy Heath is a 69 y.o. year old female who is a primary care patient of Rory Percy, [place].  Reason for follow-up: Patient called LCSW upset that she will not have transportation to appointments on April 12th and April 15th  Review of patient status, including review of consultants reports, relevant laboratory and other test results, and collaboration with appropriate care team members and the patient's provider was performed as part of comprehensive patient evaluation and provision of care management services.    SDOH (Social Determinants of Health) assessments performed: Yes   Goals Addressed            This Visit's Progress   . Transportation   On track    [place] (see longtitudinal plan of care for additional care plan information)  Current Barriers & Progress:  . Patient with HTN, DMII, and mild cognitive impairment  needs transportation to medical appointments  . Knowledge deficits and needs support, education and care coordination in order to meet this unmet need  . Patient called [employer] to schedule transportation to up coming appointments . Ran into barriers with [employer] unable to transport to appointment April 12th  Clinical Goal(s)  . Over the next 30 days patient will be able to get to all medical appointment as demonstrated by removing transportation barrier Interventions provided by LCSW:  . Assessed patient's care coordination needs and barriers . Provided patient with information about transportation via her insurance provider . Made referral to Care Guide to assist with transportation to Medical appointments April 12th to [place] at 2:00 and [facility] to [facility]. Patient Self Care Activities & Deficits:   . Patient is unable to independently navigate transportation barrier without care coordination support  . Acknowledges deficits and is motivated to resolve concern  . Patient is able to contact her insurance provider however ran into barriers Please see past updates related to this goal by clicking on the "Past Updates" button in the selected goal        Outpatient Encounter Medications as of 07/31/2019  Medication Sig Note  . aspirin 81 MG tablet Take 81 mg by mouth daily.    . cetirizine (ZYRTEC) 10 MG tablet Take 1 tablet (10 mg total) by mouth daily.   . cholecalciferol (VITAMIN D) 400 UNITS TABS Take 1,000 Units by mouth daily.    . diclofenac sodium (VOLTAREN) 1 % GEL APPLY 2 GM TOPICALLY 4 (FOUR) TIMES DAILY as needed 05/28/2019: Helpful for arm pain but not knee pain  . famotidine (PEPCID) 20 MG tablet Take 1 tablet (20 mg total) by mouth daily.   . fluticasone (FLONASE) 50 MCG/ACT nasal spray Place 2 sprays into both nostrils daily.   . hydrochlorothiazide (HYDRODIURIL) 12.5 MG tablet Take 1 tablet (12.5 mg total) by mouth daily.   [name] lisinopril (ZESTRIL) 20 MG tablet Take 1 tablet (20 mg total) by mouth daily.   . metFORMIN (GLUCOPHAGE) 500 MG tablet TAKE 1 TABLET EVERY DAY WITH BREAKFAST   . rosuvastatin (CRESTOR) 10 MG tablet Take 1 tablet (10 mg total) by mouth daily.    No facility-administered encounter medications on file as of 07/31/2019.    Referral Priority: Emergency ( in-basket message sent)  Plan: Patient is being referred to the CCM  Care Guide to assist with transportation to up coming appointment on Monday April 12th  LCSW will F/U with patient in 1 to 2 days  Casimer Lanius, [place] / [place]   [phone] 1:51 PM

## 2019-08-03 ENCOUNTER — Other Ambulatory Visit: Payer: Self-pay

## 2019-08-03 ENCOUNTER — Ambulatory Visit
Admission: RE | Admit: 2019-08-03 | Discharge: 2019-08-03 | Disposition: A | Discharge status: Home / Self Care | Payer: Medicare Other | Source: Ambulatory Visit | Attending: Family Medicine | Admitting: Family Medicine

## 2019-08-03 DIAGNOSIS — E2839 Other primary ovarian failure: Secondary | ICD-10-CM

## 2019-08-03 DIAGNOSIS — Z780 Asymptomatic menopausal state: Secondary | ICD-10-CM | POA: Diagnosis not present

## 2019-08-03 NOTE — Telephone Encounter (Signed)
On Friday, April 9th, Care Guide contacted [employer]. Spoke with [place] regarding the process to get a patient scheduled for services. Tvell e-mailed the transportation request form and stated that form would need to be completed and turned back in. Once the form is turned back in his office will reach out to the patient to verify information. Care Guide stated understanding and emailed information to [facility].

## 2019-08-03 NOTE — Telephone Encounter (Signed)
On Friday, July 31, 2019, Care Guide initially spoke Social Worker Casimer Lanius to get a better understanding of what the patient needed. Neoma Laming stated that patient called office upset because she needed transportation services for her appointments on Monday, April 12th and Thursday, April 15th. Patient had already signed up with transportation services through [employer], but was told on Friday, April 9th she would not be able to receive services because they require 3 days notice. Care Guide informed Neoma Laming that referral would be worked on, but could not guarantee that transportation services would be set up because the referral was placed on Friday afternoon. Neoma Laming stated understanding.

## 2019-08-04 ENCOUNTER — Ambulatory Visit: Payer: Self-pay | Admitting: Licensed Clinical Social Worker

## 2019-08-04 ENCOUNTER — Telehealth: Payer: Self-pay

## 2019-08-04 NOTE — Telephone Encounter (Signed)
Patient calls nurse line stating she is still having vaginal irritation. Patient states she has developed a discharge with a foul odor. Patient stated she was told to call PCP if this developed. Patient has a hard time with transportation. Please advise.

## 2019-08-04 NOTE — Chronic Care Management (AMB) (Signed)
Care Management   Clinical Social Work Follow Up   08/04/2019 Name: Wendy Heath MRN: [id number] DOB: 1950-05-19  Referred by: Rory Percy, DO  Reason for referral : [place] is a 69 y.o. year old female who is a primary care patient of Rory Percy, DO.  Reason for follow-up: incoming call from patient today.   Assessment: Patient continues to experience barriers with transportation. She was thankful for LCSW assisting with [facility] transportation to her appointment Monday however the driver stated he was unable to return to take her home.  This created a problem as she had to wait 2 hours for her family to drive from out of town to take her home.     . Recommendation: Patient has arranged for [facility] transportation for April 15th appointment.  [facility] Medicare transporation requires a 3 advance notice).  Intervention:Review of patient status, including review of consultants reports, relevant laboratory and other test results, and collaboration with appropriate care team members and the patient's provider was performed as part of comprehensive patient evaluation and provision of care management services.    SDOH (Social Determinants of Health) assessments performed: No   Goals Addressed            This Visit's Progress   . Transportation   On track    [place] (see longtitudinal plan of care for additional care plan information)  Current Barriers & Progress:  . Patient with HTN, DMII, and mild cognitive impairment  needs transportation to medical appointments  . Knowledge deficits and needs support, education and care coordination in order to meet this unmet need  . Patient called [employer] to schedule transportation to up coming appointments . Ran into barriers with [employer] unable to transport to appointment April 12th  Clinical Goal(s)  . Over the next 30 days patient will be able to get to all medical appointment as demonstrated by  removing transportation barrier Interventions provided by LCSW:  . Assessed patient's care coordination needs and barriers . Provided patient with information about transportation via her insurance provider . Made referral to Care Guide to assist with transportation to Medical appointments April 12th to [place] at 2:00 and [facility]th to [facility]. Patient Self Care Activities & Deficits:  . Patient is unable to independently navigate transportation barrier without care coordination support  . Acknowledges deficits and is motivated to resolve concern  . Patient is able to contact her insurance provider however ran into barriers Please see past updates related to this goal by clicking on the "Past Updates" button in the selected goal        Outpatient Encounter Medications as of 08/04/2019  Medication Sig Note  . aspirin 81 MG tablet Take 81 mg by mouth daily.    . cetirizine (ZYRTEC) 10 MG tablet Take 1 tablet (10 mg total) by mouth daily.   . cholecalciferol (VITAMIN D) 400 UNITS TABS Take 1,000 Units by mouth daily.    . diclofenac sodium (VOLTAREN) 1 % GEL APPLY 2 GM TOPICALLY 4 (FOUR) TIMES DAILY as needed 05/28/2019: Helpful for arm pain but not knee pain  . famotidine (PEPCID) 20 MG tablet Take 1 tablet (20 mg total) by mouth daily.   . fluticasone (FLONASE) 50 MCG/ACT nasal spray Place 2 sprays into both nostrils daily.   . hydrochlorothiazide (HYDRODIURIL) 12.5 MG tablet Take 1 tablet (12.5 mg total) by mouth daily.   [name] lisinopril (ZESTRIL) 20 MG tablet Take  1 tablet (20 mg total) by mouth daily.   . metFORMIN (GLUCOPHAGE) 500 MG tablet TAKE 1 TABLET EVERY DAY WITH BREAKFAST   . rosuvastatin (CRESTOR) 10 MG tablet Take 1 tablet (10 mg total) by mouth daily.    No facility-administered encounter medications on file as of 08/04/2019.   Plan: will F/U with patient in 30 days. Patient will contact LCSW if needed  Casimer Lanius, [place] / [place]   [phone] 12:32 PM

## 2019-08-05 ENCOUNTER — Telehealth: Payer: Medicare Other

## 2019-08-05 NOTE — Telephone Encounter (Signed)
Patient scheduled for ATC on 08-06-19.  Wendy Heath,CMA

## 2019-08-05 NOTE — Telephone Encounter (Signed)
If she has developed new vaginal discharge (which wasn't present when I saw her last), we need to collect another sample.

## 2019-08-06 ENCOUNTER — Other Ambulatory Visit (HOSPITAL_COMMUNITY)
Admission: RE | Admit: 2019-08-06 | Discharge: 2019-08-06 | Disposition: A | Discharge status: Home / Self Care | Payer: Medicare Other | Source: Ambulatory Visit | Attending: Family Medicine | Admitting: Family Medicine

## 2019-08-06 ENCOUNTER — Encounter: Payer: Self-pay | Admitting: Family Medicine

## 2019-08-06 ENCOUNTER — Other Ambulatory Visit: Payer: Self-pay

## 2019-08-06 ENCOUNTER — Ambulatory Visit (INDEPENDENT_AMBULATORY_CARE_PROVIDER_SITE_OTHER): Payer: Medicare Other | Admitting: Family Medicine

## 2019-08-06 VITALS — BP 130/94 | HR 74 | Ht 63.0 in | Wt 187.4 lb

## 2019-08-06 DIAGNOSIS — H524 Presbyopia: Secondary | ICD-10-CM | POA: Diagnosis not present

## 2019-08-06 DIAGNOSIS — N898 Other specified noninflammatory disorders of vagina: Secondary | ICD-10-CM | POA: Insufficient documentation

## 2019-08-06 DIAGNOSIS — H2513 Age-related nuclear cataract, bilateral: Secondary | ICD-10-CM | POA: Diagnosis not present

## 2019-08-06 DIAGNOSIS — H47291 Other optic atrophy, right eye: Secondary | ICD-10-CM | POA: Diagnosis not present

## 2019-08-06 DIAGNOSIS — H5202 Hypermetropia, left eye: Secondary | ICD-10-CM | POA: Diagnosis not present

## 2019-08-06 LAB — POCT WET PREP (WET MOUNT)
Clue Cells Wet Prep Whiff POC: NEGATIVE
WBC, Wet Prep HPF POC: >20

## 2019-08-06 NOTE — Patient Instructions (Signed)
It was great seeing you today!  We performed a wet prep and gonorrhea/chlamydia swab.  I will give you call on the results come back.

## 2019-08-06 NOTE — Assessment & Plan Note (Signed)
Wet prep positive for trichomoniasis.  Will treat with metronidazole 2 g one-time dose.  Await results of GC/chlamydia for possible further treatment.

## 2019-08-06 NOTE — Progress Notes (Signed)
   CHIEF COMPLAINT / HPI: 69 year old female who presents for vaginal discharge and odor.  Patient was seen for this same issue back and 07/02/2019.  There with discharge noted on exam, but wet prep was negative.  A UA was collected which did show some protein, leukocytes but given her lack of symptoms was not treated.  Patient states that the discharge has persisted along with the odor.  She does mention that she was sexually active back in March prior to her symptoms starting.   PERTINENT  PMH / PSH: none   OBJECTIVE: BP (!) 130/94   Pulse 74   Ht 5\' 3"  (1.6 m)   Wt 187 lb 6.4 oz (85 kg)   SpO2 100%   BMI 33.20 kg/m   Gen: 69 year old African-American female, no acute distress, resting comfortably CV: RRR, no murmur Resp: CTAB, no wheezes, non-labored Neuro: Alert and oriented, Speech clear, No gross deficits  GU: Thick vaginal discharge, with clear yellow color.  Cervix easily visualized. Wet prep: Positive for trichomonas, white blood cells, bacteria  ASSESSMENT / PLAN:  Vaginal discharge Wet prep positive for trichomoniasis.  Will treat with metronidazole 2 g one-time dose.  Await results of GC/chlamydia for possible further treatment.   Wendy Dawn MD PGY-3 Family Medicine Resident [place]

## 2019-08-07 ENCOUNTER — Other Ambulatory Visit: Payer: Self-pay | Admitting: Family Medicine

## 2019-08-07 LAB — CERVICOVAGINAL ANCILLARY ONLY
Chlamydia: NEGATIVE
Comment: NEGATIVE
Comment: NEGATIVE
Comment: NORMAL
Neisseria Gonorrhea: NEGATIVE
Trichomonas: POSITIVE — AB

## 2019-08-07 MED ORDER — METRONIDAZOLE 500 MG PO TABS: 2000.0000 mg | ORAL_TABLET | Freq: Once | ORAL | 0 refills | Status: AC

## 2019-08-07 NOTE — Progress Notes (Signed)
Discussed results with patient of positive trichomoniasis.  Explained that this is a sexually transmitted disease and that I strongly recommend for her to get her partner treated as well.  I explained that if they do not both get treated they will just continue to give it to each other.  I explained that the treatment will be Flagyl 2 g 1 time, I confirmed that her pharmacy is [facility].  Patient with no other questions, and appreciative of call.  [name] MD PGY-3 Family Medicine Resident

## 2019-08-12 ENCOUNTER — Telehealth: Payer: Self-pay | Admitting: *Deleted

## 2019-08-12 NOTE — Telephone Encounter (Signed)
Pt calls in a states that she has been dizzy since taking the flagyl on Friday.  She report that she is able to walk but she is "unsteady" on her feet.   Full today.  Appt made for tomorrow AM.  Will call EMS if dizziness gets worse or she falls.  Advised to get up slowly and take her time when walking.  Consulted with Dr. Ky Barban who agrees. [name], CMA

## 2019-08-13 ENCOUNTER — Other Ambulatory Visit: Payer: Self-pay

## 2019-08-13 ENCOUNTER — Ambulatory Visit: Payer: Self-pay | Admitting: Licensed Clinical Social Worker

## 2019-08-13 ENCOUNTER — Ambulatory Visit (INDEPENDENT_AMBULATORY_CARE_PROVIDER_SITE_OTHER): Payer: Medicare Other | Admitting: Family Medicine

## 2019-08-13 VITALS — BP 122/78 | HR 91 | Ht 63.0 in | Wt 185.8 lb

## 2019-08-13 DIAGNOSIS — R42 Dizziness and giddiness: Secondary | ICD-10-CM

## 2019-08-13 DIAGNOSIS — Z7189 Other specified counseling: Secondary | ICD-10-CM

## 2019-08-13 DIAGNOSIS — Z741 Need for assistance with personal care: Secondary | ICD-10-CM

## 2019-08-13 NOTE — Progress Notes (Signed)
    SUBJECTIVE:   CHIEF COMPLAINT / HPI:   "Dizziness" Patient states she was seen in the clinic last week and was started on Metronidazole for Trichomoniasis infection. She took 4 pills for one day. After that she began to feel "dizzy". She describes the sensation as "its like if I was drunk". She has never had this before. She has a history of vertigo but states this feels different. No nausea or vomiting. She describes the dizziness as only when she gets up and walks. She sometimes has to put her hand out to make sure she doesn't fall. She has not had any falls. She does not feel dizzy when sitting still or if she is sitting and just moving her head. She does not feel like the room is spinning. No headaches no confusion, no weakness, no slurred speech, no syncopal events. No hearing changes.  PERTINENT  PMH / PSH: HTN, HLD, T2DM, OA of both knees, Hx of CVA, OSA  OBJECTIVE:   BP 122/78   Pulse 91   Ht 5\' 3"  (1.6 m)   Wt 185 lb 12.8 oz (84.3 kg)   SpO2 99%   BMI 32.91 kg/m   Orthostatics: Laying: 138/90 Sitting: 130/94 Standing: 128/88 Gen: Alert and Oriented x 3, NAD HEENT: Normocephalic, atraumatic CV: RRR, no murmurs, normal S1, S2 split Resp: CTAB, no wheezing, rales, or rhonchi, comfortable work of breathing Ext: no clubbing, cyanosis, or edema Neuro: CN II -XII intact, 5/5 UE and LE strength bilaterally, gross sensation intact, +2 patellar and achilles reflexes bilaterally Skin: warm, dry, intact, no rashes   ASSESSMENT/PLAN:   Dizziness Mutliple etiologies considered including vestibular neuritis vs BPPV vs poor posterior cerebral circulation vs orthostatic hypotension vs vertigo vs medication side effect. BPPV less likely as she has no symptoms when she moves her head while sitting. Vestibular neuritis less likey as she has no hearing changes. Metronidazole has a 4% association with dizziness but her last dose was 4 days ago and her symptoms are persistent so I find that  less likely. Orthostatics were negative but still could be playing a role. I do think she may have some posterior vascular component to her dizziness given her history of CVA. Most likely it is a combination of blood pressure and perhaps over treating her DM as her A1c is 6.0% on metformin.  - Hold HCTZ and Metformin for two weeks; recheck orthostatics and BP and A1c.  - If she is still symptomatic after these changes I would consider ordering a brain MRI/MRA to check for posterior vascular compromise.   Nuala Alpha, [place]

## 2019-08-13 NOTE — Patient Instructions (Signed)
Licensed Clinical Social Worker Visit Information Ms. Gilhooley  it was nice speaking with you. Please call me directly if you have questions [phone] Goals we discussed today: personal care options Materials provided: Yes: information mailed for personal care options Ms. [facility] received Care Management services today:  1. Care Management services include personalized support from designated clinical staff supervised by her physician, including individualized plan of care and coordination with other care providers 2. 24/7 contact [phone] for assistance for urgent and routine care needs. 3. Care Management are voluntary services and be declined at any time by calling the office. Patient verbalizes understanding of instructions provided today.  Follow up plan:  SW will follow up with patient by phone over the next 2 weeks  Maurine Cane, LCSW

## 2019-08-13 NOTE — Chronic Care Management (AMB) (Signed)
   Clinical Social Work  Care Management referral   08/13/2019 Name: Wendy Heath MRN: [id number] DOB: February 02, 1951  Wendy Heath is a 69 y.o. year old female who is a primary care patient of Rory Percy, DO .  Patient called LCSW reports she would like assistance with ADL's.  Reports having difficulty with activities of daily living.  Intervention: Patient does not qualify for [place] as she does not have Medicaid.  Reviewed other none Medicaid options with patient including private pay.  Private pay is not an option for her.  Other options all have wait list.  Patient requested LCSW e-mail information.  Information was e-mail but came back undeliverable.  Also discussed Meals on wheels with patient.  Patient would like LCSW to place referral. [facility] [place] [facility] ([facility])   [phone] [facility] [place]  [facility] [place] [facility] ([facility]) Provides community-based care and services to individuals who need nursing home level of care, but want to remain at home.  . Must be 39 years or older . Requires nursing home care . Able to live safely in community at the time of [place] [facility] (for individuals without Medicaid) Provides health care in the homes of adults in [place].  Home visits to evaluate an individual's home environment and his/her general physical condition.  Nurse and aide services are available on a sliding fee scale based on income.   Steps for [facility] referral . Call [place] [phone]   [id number] application taken over the phone. . For a CNA- there is an 8 month to 1 year wait . RN- Can usually schedule appointment within 2 weeks   In Home Aide Service (for individuals without Medicaid) There is a wait list, call to be placed on the list [phone] The program contracts with community home care agencies to provide in  home services for light housekeeping and personal care services (bathing, hair washing, toileting) that assist individuals with safely while remaining in their homes     Unable to reach patient by phone when returned called for an updated e-mail address. (left message). packet mailed to patient with the information above  Review of patient status, including review of consultants reports, relevant laboratory and other test results, and collaboration with appropriate care team members and the patient's provider was performed as part of comprehensive patient evaluation and provision of care management services.    Plan:  LCSW will place referral and F/U with patient in 2 weeks  Casimer Lanius, [place] / [place]   [phone] 4:17 PM

## 2019-08-13 NOTE — Patient Instructions (Addendum)
It was great to meet you today! Thank you for letting me participate in your care!  Today, we discussed your recent dizziness. I have held two medications that may be contributing to your symptoms. Please STOP taking METFORMIN and HYDRODIURIL. Please follow up with Dr. Ky Barban your PCP in two weeks to see if your symptoms have improved. If not, we may need to get some imaging.  Be well, Harolyn Rutherford, DO PGY-3, [facility]

## 2019-08-14 NOTE — Assessment & Plan Note (Signed)
Mutliple etiologies considered including vestibular neuritis vs BPPV vs poor posterior cerebral circulation vs orthostatic hypotension vs vertigo vs medication side effect. BPPV less likely as she has no symptoms when she moves her head while sitting. Vestibular neuritis less likey as she has no hearing changes. Metronidazole has a 4% association with dizziness but her last dose was 4 days ago and her symptoms are persistent so I find that less likely. Orthostatics were negative but still could be playing a role. I do think she may have some posterior vascular component to her dizziness given her history of CVA. Most likely it is a combination of blood pressure and perhaps over treating her DM as her A1c is 6.0% on metformin.  - Hold HCTZ and Metformin for two weeks; recheck orthostatics and BP and A1c.  - If she is still symptomatic after these changes I would consider ordering a brain MRI/MRA to check for posterior vascular compromise.

## 2019-08-20 ENCOUNTER — Ambulatory Visit: Payer: Self-pay | Admitting: Licensed Clinical Social Worker

## 2019-08-20 DIAGNOSIS — Z741 Need for assistance with personal care: Secondary | ICD-10-CM

## 2019-08-20 NOTE — Chronic Care Management (AMB) (Signed)
  Social Work Care Management  Unsuccessful Phone Outreach   08/20/2019 Name: Myrtice Scavetta MRN: [id number] DOB: 05/04/1950  Reason for referral : Care Coordination (resources)   Wendy Heath is a 69 y.o. year old female who sees Rory Percy, [place] for primary care.    LCSW called patient to assess needs and barriers reference resources mailed for personal care options without Medicaid.  Also to provide update that LCSW completed referral for Mobile Meals based on previous request. Telephone outreach was unsuccessful. A HIPPA compliant phone message was left for the patient providing contact information and requesting a return call.  Plan:  If no return call is received. LCSW will call again in 7 to 10 days.  Casimer Lanius, [place] / [place]   [phone] 1:33 PM

## 2019-08-20 NOTE — Chronic Care Management (AMB) (Signed)
Care Management   Clinical Social Work Follow Up   08/20/2019 Name: Wendy Heath MRN: [id number] DOB: March 19, 1951  Referred by: Rory Percy, DO  Reason for referral : Care Coordination (resources)  Wendy Heath is a 69 y.o. year old female who is a primary care patient of Rory Percy, DO.  Reason for follow-up: Phone encounter with patient today for ongoing assessment and brief interventions to assist with care coordination needs.   Assessment: Patient continues to experience difficulty with meeting personal needs at home.     Recommendation: Patient may benefit from, and is in agreement to contact the [facility] [place] [facility] ([facility]) to see if this is a program she maybe interesting in participating in. .   Review of patient status, including review of consultants reports, relevant laboratory and other test results, and collaboration with appropriate care team members and the patient's provider was performed as part of comprehensive patient evaluation and provision of care management services.    Advance Directive Status: N See Care Plan and [place] application for related entries. SDOH (Social Determinants of Health) assessments performed: Yes SDOH Interventions     Most Recent Value  SDOH Interventions  SDOH Interventions for the Following Domains  Food Insecurity  Food Insecurity Interventions  -- [mobile meals referral]      Goals Addressed            This Visit's Progress   . Need help at home       [place] (see longitudinal plan of care for additional care plan information)  Current Barriers:   . Patient needs Support, Education, and Care Coordination to resolve unmet Personal Care needs . Patient does not have Medicaid and will not qualify for [facility] Clinical Social Work Goal(s):  [name] Over the next 30 to 45 days, patient will explore options to have personal care needs . Interventions provided by LCSW : . Assessed  needs, level of care concerns, basic eligibility and provided education on Personal Care Service options  . Reviewed packet of information mailed to patient with none Medicaid PCS community options ( PACE and In-Home Aide Program) . Completed mobile meals application to assist with food concerns Patient Self Care Activities & Deficits:  . Patient is unable to perform ADLs independently without assistance  . Family/support system will assist patient with meeting needs until she is able to locate other options . Patient and daughter will call PACE to find out more about the program . Patient will also call in-home aide program at [place] to be placed on the wait list [phone] Initial goal documentation      Outpatient Encounter Medications as of 08/20/2019  Medication Sig Note  . aspirin 81 MG tablet Take 81 mg by mouth daily.    . cetirizine (ZYRTEC) 10 MG tablet Take 1 tablet (10 mg total) by mouth daily.   . cholecalciferol (VITAMIN D) 400 UNITS TABS Take 1,000 Units by mouth daily.    . diclofenac sodium (VOLTAREN) 1 % GEL APPLY 2 GM TOPICALLY 4 (FOUR) TIMES DAILY as needed 05/28/2019: Helpful for arm pain but not knee pain  . famotidine (PEPCID) 20 MG tablet Take 1 tablet (20 mg total) by mouth daily.   . fluticasone (FLONASE) 50 MCG/ACT nasal spray Place 2 sprays into both nostrils daily.   . hydrochlorothiazide (HYDRODIURIL) 12.5 MG tablet Take 1 tablet (12.5 mg total) by mouth daily.   [name] lisinopril (ZESTRIL) 20 MG tablet Take 1 tablet (20  mg total) by mouth daily.   . metFORMIN (GLUCOPHAGE) 500 MG tablet TAKE 1 TABLET EVERY DAY WITH BREAKFAST   . rosuvastatin (CRESTOR) 10 MG tablet Take 1 tablet (10 mg total) by mouth daily.    No facility-administered encounter medications on file as of 08/20/2019.   Plan: LCSW will F/U with patient in 7 to 10 days  Casimer Lanius, [place] / [place]   [phone] 2:01 PM

## 2019-08-20 NOTE — Patient Instructions (Signed)
Licensed Clinical Social Worker Visit Information Ms. Lamme  it was nice speaking with you. Please call me directly if you have questions [phone] Goals we discussed today: personal care and mobile meals  Goals Addressed            This Visit's Progress   . Need help at home       [place] (see longitudinal plan of care for additional care plan information)  Current Barriers:   . Patient needs Support, Education, and Care Coordination to resolve unmet Personal Care needs . Patient does not have Medicaid and will not qualify for [facility]CS Clinical Social Work Goal(s):  [name] Over the next 30 to 45 days, patient will explore options to have personal care needs . Interventions provided by LCSW : . Assessed needs, level of care concerns, basic eligibility and provided education on Personal Care Service options  . Reviewed packet of information mailed to patient with none Medicaid PCS community options ( PACE and In-Home Aide Program) . Completed mobile meals application to assist with food concerns Patient Self Care Activities & Deficits:  . Patient is unable to perform ADLs independently without assistance  . Family/support system will assist patient with meeting needs until she is able to locate other options . Patient and daughter will call PACE to find out more about the program . Patient will also call in-home aide program at [place] to be placed on the wait list [phone] Initial goal documentation     Materials provided: Verbal education about community resource options provided by phone Ms. [place] received Care Management services today:  1. Care Management services include personalized support from designated clinical staff supervised by her physician, including individualized plan of care and coordination with other care providers 2. 24/7 contact [phone] for assistance for urgent and routine care needs. 3. Care Management are voluntary services and be declined at any time  by calling the office. Patient  verbally agreed to assistance and services provided by embedded care coordination/care management team today.   Patient verbalizes understanding of instructions provided today.  Follow up plan:  SW will follow up with patient by phone over the next 1 to 2 weeks  Maurine Cane, LCSW

## 2019-08-26 ENCOUNTER — Other Ambulatory Visit: Payer: Self-pay | Admitting: Family Medicine

## 2019-08-28 ENCOUNTER — Telehealth: Payer: Self-pay | Admitting: *Deleted

## 2019-08-28 ENCOUNTER — Ambulatory Visit: Payer: Medicare Other | Admitting: Family Medicine

## 2019-08-28 NOTE — Telephone Encounter (Signed)
Patient does not take clonidine. Med list previously reviewed with patient and does not include this medication. Per chart review, doesn't appear she has ever been on this medication.

## 2019-08-28 NOTE — Telephone Encounter (Signed)
Refill request received for clonidine for patient but it is not on her current medication list and there was no strength specified on the request.  Will forward to MD. Johnney Ou

## 2019-08-31 ENCOUNTER — Other Ambulatory Visit: Payer: Self-pay

## 2019-08-31 ENCOUNTER — Encounter: Payer: Self-pay | Admitting: Family Medicine

## 2019-08-31 ENCOUNTER — Ambulatory Visit (INDEPENDENT_AMBULATORY_CARE_PROVIDER_SITE_OTHER): Payer: Medicare Other | Admitting: Family Medicine

## 2019-08-31 VITALS — BP 154/102 | HR 83 | Ht 63.0 in | Wt 188.4 lb

## 2019-08-31 DIAGNOSIS — G479 Sleep disorder, unspecified: Secondary | ICD-10-CM | POA: Diagnosis not present

## 2019-08-31 DIAGNOSIS — E119 Type 2 diabetes mellitus without complications: Secondary | ICD-10-CM

## 2019-08-31 DIAGNOSIS — E1159 Type 2 diabetes mellitus with other circulatory complications: Secondary | ICD-10-CM

## 2019-08-31 DIAGNOSIS — R42 Dizziness and giddiness: Secondary | ICD-10-CM | POA: Diagnosis not present

## 2019-08-31 DIAGNOSIS — I10 Essential (primary) hypertension: Secondary | ICD-10-CM

## 2019-08-31 DIAGNOSIS — I152 Hypertension secondary to endocrine disorders: Secondary | ICD-10-CM

## 2019-08-31 DIAGNOSIS — R519 Headache, unspecified: Secondary | ICD-10-CM

## 2019-08-31 LAB — POCT GLYCOSYLATED HEMOGLOBIN (HGB A1C): HbA1c, POC (prediabetic range): 5.7 % (ref 5.7–6.4)

## 2019-08-31 NOTE — Assessment & Plan Note (Signed)
Ongoing. Poor sleep hygiene. Discussed appropriate sleep hygiene, see AVS for details. Suspect may be contributing to headache as well.

## 2019-08-31 NOTE — Patient Instructions (Signed)
It was great to see you!  Our plans for today:  - STOP taking hydrochlorothiazide and metformin. - Take motrin as needed for your headache. - Come back soon to make sure your trichomonas infection is gone.  - Try the following to help you sleep better:  - have a regular bedtime routine that you follow every night. Go to bed around the same time every night.  - limit naps during the day  - no screens (TV, phone, tablet, computer) at least 1-2 hours before bedtime.  - have a quiet and dark sleeping environment.  - no large meals or drinks about 1 hour before bed.  - Avoid taking diuretics (hydrochlorothiazide, furosemide) in the evenings.  - Avoid caffeine after 3pm.  - Exercise or move your body regularly every day.  - You can also try melatonin 5 mg over the counter. Take this 1-2 hours before bed. You can increase to 10mg  if this is not helpful. - If you are lying in bed for 30 mins-1 hour and aren't falling asleep, get out of bed and do something relaxing like reading (NO TV!) until you are tired.  We are checking some labs today, we will call you or send you a letter if they are abnormal.   Take care and seek immediate care sooner if you develop any concerns.   Dr. [facility]

## 2019-08-31 NOTE — Assessment & Plan Note (Signed)
Likely 2/2 lower BP and/or blood sugar given resolution with discontinuation of HCTZ and metformin. BP appropriate for age, A1c 5.7 today, will not restart meds. Continue lisinopril. Will obtain BMP today.

## 2019-08-31 NOTE — Progress Notes (Signed)
    SUBJECTIVE:   CHIEF COMPLAINT / HPI:   Dizziness/Headache - Previously seen for dizziness on 4/22 after metronidazole treatment for trichomonas.  Neuro exam normal with negative orthostatics at that time.  Thought to be secondary to possible posterior vascular compromise given history of CVA or overtreatment of HTN and diabetes.  HCTZ and Metformin held for 2 weeks. - not checking BP at home - dizziness improved although has been having some intermittent frontal headache and fatigue. Relieved by Motrin, sitting down and resting. No trauma or falls. On ASA only, no blood thinners. Denies N/V, fever, phonophobia, difficulties walking or speaking. Endorsing some phonophobia - still smoking  Insomnia Goes to bed around 11, falls asleep within the hour. Wakes around 8am. Doesn't feel well rested. Unsure if she snores but doesn't think so. No h/o OSA. TV in bedroom stays on all night. Doesn't have regular bedtime routine. Drinks coffee only in morning. Gets up sometimes to void, able to go back to bed easily. Previously had relief with gabapentin, never tried melatonin.  PERTINENT  PMH / PSH: HTN, T2DM, HLD, GERD, obesity, H/o stroke, right eye blindness, mild cognitive impairment  OBJECTIVE:   BP (!) 154/102   Pulse 83   Ht 5\' 3"  (1.6 m)   Wt 188 lb 6 oz (85.4 kg)   SpO2 99%   BMI 33.37 kg/m   Gen: elderly, in NAD MSK: strength 5/5 to U/LE bilaterally, normal gait. Neuro: Alert and oriented, speech normal.  PERRL in L eye (R eye blind), Extraocular movements intact.  Intact symmetric sensation to light touch of face and extremities bilaterally.  Hearing grossly intact bilaterally.  Tongue protrudes normally with no deviation.  Shoulder shrug, smile symmetric. Finger to nose normal.  ASSESSMENT/PLAN:   Dizziness Likely 2/2 lower BP and/or blood sugar given resolution with discontinuation of HCTZ and metformin. BP appropriate for age, A1c 5.7 today, will not restart meds. Continue  lisinopril. Will obtain BMP today.  Sleep difficulties Ongoing. Poor sleep hygiene. Discussed appropriate sleep hygiene, see AVS for details. Suspect may be contributing to headache as well.  Headache Most consistent with tension given frontal nature, throbbing and relieved with motrin and resting. Neuro exam wnl today and exam and history without red flags to concern for trauma, infection, stroke, mass. Sleep hygiene discussed, see AVS for details. Continue prn NSAID, try heating pad. Emergency precautions discussed.    Wendy Heath, Wendy Heath

## 2019-08-31 NOTE — Assessment & Plan Note (Signed)
Most consistent with tension given frontal nature, throbbing and relieved with motrin and resting. Neuro exam wnl today and exam and history without red flags to concern for trauma, infection, stroke, mass. Sleep hygiene discussed, see AVS for details. Continue prn NSAID, try heating pad. Emergency precautions discussed.

## 2019-09-01 LAB — BASIC METABOLIC PANEL
BUN/Creatinine Ratio: 11 — ABNORMAL LOW (ref 12–28)
BUN: 10 mg/dL (ref 8–27)
CO2: 24 mmol/L (ref 20–29)
Calcium: 9.7 mg/dL (ref 8.7–10.3)
Chloride: 105 mmol/L (ref 96–106)
Creatinine, Ser: 0.87 mg/dL (ref 0.57–1.00)
GFR calc Af Amer: 78 mL/min/{1.73_m2} (ref >59)
GFR calc non Af Amer: 68 mL/min/{1.73_m2} (ref >59)
Glucose: 114 mg/dL — ABNORMAL HIGH (ref 65–99)
Potassium: 3.6 mmol/L (ref 3.5–5.2)
Sodium: 141 mmol/L (ref 134–144)

## 2019-09-02 ENCOUNTER — Ambulatory Visit: Payer: Medicare Other | Admitting: Licensed Clinical Social Worker

## 2019-09-02 DIAGNOSIS — Z139 Encounter for screening, unspecified: Secondary | ICD-10-CM

## 2019-09-02 NOTE — Chronic Care Management (AMB) (Signed)
Care Management   Clinical Social Work Follow Up   09/02/2019 Name: Wendy Heath MRN: [id number] DOB: Dec 08, 1950  Referred by: Rory Percy, DO  Reason for referral : Care Coordination (F/U)  Wendy Heath is a 69 y.o. year old female who is a primary care patient of Rory Percy, DO.  Reason for follow-up: Phone encounter with patient today for ongoing assessment and brief interventions to assist with care coordination needs.   Assessment: Patient reports doing well except for concerns with her allergies ( in-basket message sent to PCP).  Patient has been approved for a lower level apartment and is excited that she will no longer have to manager stairs.  Family is assisting her in the home MWF.  She is no longer interested in additional support in her home at this time. All goals established with CCM are complete.    Review of patient status, including review of consultants reports, relevant laboratory and other test results, and collaboration with appropriate care team members and the patient's provider was performed as part of comprehensive patient evaluation and provision of care management services.    Advance Directive Status: N See Care Plan  for related entries. SDOH (Social Determinants of Health) assessments performed: Yes, no needs identified   Goals Addressed       Completed     This Visit's Progress   . Needs Advance Directive   on track    Wendy Heath (see longtitudinal plan of care for additional care plan information)  Current Barriers & progress:  . Patient with HTN, DMII, and mild cognitive impairment does not have an Advance Directive . Knowledge deficits, education and support in order to complete this document Clinical Social Work Goal(s):  [name] Over the next 45 days, the patient will have Advance Directive notarized and provide a copy to provider office Interventions provided by LCSW: . Answered question about Advanced Directives   . Advised patient to completed advance directives and bring to office to have notarized  Patient Self Care Activities:  . Is able to complete documentation independently . Able to identify [place] Please see past updates related to this goal by clicking on the "Past Updates" button in the selected goal      . COMPLETED: Transportation       Wendy Heath (see longtitudinal plan of care for additional care plan information)  Current Barriers & Progress:  . Patient with HTN, DMII, and mild cognitive impairment  needs transportation to medical appointments  . Knowledge deficits and needs support, education and care coordination in order to meet this unmet need  . Patient called [employer] to schedule transportation to up coming appointments Clinical Goal(s)  . Over the next 30 days patient will be able to get to all medical appointment as demonstrated by removing transportation barrier Interventions provided by LCSW:  . Assessed patient's care coordination needs and barriers . Provided patient with information about transportation via her insurance provider Patient Self Care Activities & Deficits:  . Patient is unable to independently navigate transportation barrier without care coordination support  . Acknowledges deficits and is motivated to resolve concern  . Patient is able to contact her insurance provider for transportation needs Please see past updates related to this goal by clicking on the "Past Updates" button in the selected goal        Outpatient Encounter Medications as of 09/02/2019  Medication Sig Note  . aspirin 81  MG tablet Take 81 mg by mouth daily.    . cetirizine (ZYRTEC) 10 MG tablet Take 1 tablet (10 mg total) by mouth daily.   . cholecalciferol (VITAMIN D) 400 UNITS TABS Take 1,000 Units by mouth daily.    . diclofenac sodium (VOLTAREN) 1 % GEL APPLY 2 GM TOPICALLY 4 (FOUR) TIMES DAILY as needed 05/28/2019: Helpful for  arm pain but not knee pain  . famotidine (PEPCID) 20 MG tablet Take 1 tablet (20 mg total) by mouth daily.   . fluticasone (FLONASE) 50 MCG/ACT nasal spray USE 2 SPRAYS IN BOTH  NOSTRILS DAILY   . lisinopril (ZESTRIL) 20 MG tablet Take 1 tablet (20 mg total) by mouth daily.   . rosuvastatin (CRESTOR) 10 MG tablet Take 1 tablet (10 mg total) by mouth daily.    No facility-administered encounter medications on file as of 09/02/2019.   Plan: No F/U scheduled.  Patient will call office as needed  Casimer Lanius, [place] / [place]   [phone] 2:00 PM

## 2019-09-02 NOTE — Patient Instructions (Signed)
Licensed Clinical Social Worker Visit Information Ms. Bueche  it was nice speaking with you. Please call me directly if you have questions [phone] Goals we discussed today:  Goals Addressed            This Visit's Progress   . COMPLETED: Needs Advance Directive   Not on track    [employer] (see longtitudinal plan of care for additional care plan information)  Current Barriers & progress:  . Patient with HTN, DMII, and mild cognitive impairment does not have an Advance Directive . Knowledge deficits, education and support in order to complete this document Clinical Social Work Goal(s):  [name] Over the next 45 days, the patient will have Advance Directive notarized and provide a copy to provider office Interventions provided by LCSW: . Answered question about Advanced Directives  . Advised patient to completed advance directives and bring to office to have notarized  Patient Self Care Activities:  . Is able to complete documentation independently . Able to identify [place] Please see past updates related to this goal by clicking on the "Past Updates" button in the selected goal      . COMPLETED: Transportation       [place] (see longtitudinal plan of care for additional care plan information)  Current Barriers & Progress:  . Patient with HTN, DMII, and mild cognitive impairment  needs transportation to medical appointments  . Knowledge deficits and needs support, education and care coordination in order to meet this unmet need  . Patient called [employer] to schedule transportation to up coming appointments Clinical Goal(s)  . Over the next 30 days patient will be able to get to all medical appointment as demonstrated by removing transportation barrier Interventions provided by LCSW:  . Assessed patient's care coordination needs and barriers . Provided patient with information about transportation via her insurance  provider Patient Self Care Activities & Deficits:  . Patient is unable to independently navigate transportation barrier without care coordination support  . Acknowledges deficits and is motivated to resolve concern  . Patient is able to contact her insurance provider for transportation needs Please see past updates related to this goal by clicking on the "Past Updates" button in the selected goal       Materials provided: Verbal education about community resources provided by phone Ms. [facility] received Care Management services today:  1. Care Management services include personalized support from designated clinical staff supervised by her physician, including individualized plan of care and coordination with other care providers 2. 24/7 contact [phone] for assistance for urgent and routine care needs. 3. Care Management are voluntary services and be declined at any time by calling the office.  Patient  verbally agreed to assistance and services provided by embedded care coordination/care management team today.   Patient verbalizes understanding of instructions provided today.  Follow up plan: No F/U calls scheduled. Please call the office if you need assistance    Maurine Cane, LCSW

## 2019-09-11 ENCOUNTER — Other Ambulatory Visit: Payer: Self-pay

## 2019-09-15 NOTE — Telephone Encounter (Signed)
Celexa is not on patient's med list. Please have patient schedule a f/u visit if she would like to start this  Caroline More, DO, PGY-3 [facility] 09/15/2019 2:56 PM

## 2019-09-16 NOTE — Telephone Encounter (Signed)
Appt made for 09-23-19. Osborn Pullin,CMA

## 2019-09-22 ENCOUNTER — Other Ambulatory Visit: Payer: Self-pay

## 2019-09-22 ENCOUNTER — Ambulatory Visit (INDEPENDENT_AMBULATORY_CARE_PROVIDER_SITE_OTHER): Payer: Medicare Other | Admitting: Family Medicine

## 2019-09-22 ENCOUNTER — Telehealth: Payer: Self-pay

## 2019-09-22 ENCOUNTER — Encounter: Payer: Self-pay | Admitting: Family Medicine

## 2019-09-22 VITALS — BP 174/118 | HR 83 | Wt 188.6 lb

## 2019-09-22 DIAGNOSIS — I10 Essential (primary) hypertension: Secondary | ICD-10-CM | POA: Diagnosis not present

## 2019-09-22 DIAGNOSIS — E1159 Type 2 diabetes mellitus with other circulatory complications: Secondary | ICD-10-CM | POA: Diagnosis not present

## 2019-09-22 DIAGNOSIS — Z720 Tobacco use: Secondary | ICD-10-CM | POA: Diagnosis not present

## 2019-09-22 DIAGNOSIS — I152 Hypertension secondary to endocrine disorders: Secondary | ICD-10-CM

## 2019-09-22 MED ORDER — LISINOPRIL 30 MG PO TABS: 30.0000 mg | ORAL_TABLET | Freq: Every day | ORAL | 0 refills | Status: DC

## 2019-09-22 MED ORDER — NICOTINE 7 MG/24HR TD PT24: 7.0000 mg | MEDICATED_PATCH | Freq: Every day | TRANSDERMAL | 0 refills | Status: DC

## 2019-09-22 MED FILL — NICOTINE 7 MG/24HR PATCH: 7 | 14 days supply | Qty: 14 | Fill #0

## 2019-09-22 NOTE — Patient Instructions (Addendum)
Today we talked about your blood pressure being elevated.  Instead of restarting your hydrochlorothiazide, we are going to increase your lisinopril to 30 mg.  We will do some blood work today and you can repeat that blood work when you come back to see Dr. Chauncey Reading on Friday.  I have sent a prescription to the [facility] outpatient pharmacy, this is the one that is underneath the nursing home here on property that I told you about.  They will have the nicotine patches for you.  If you get any new symptoms like chest pain or shortness of breath please reach out to a physician immediately because you might need to go to the hospital, Dr. Criss Rosales

## 2019-09-22 NOTE — Progress Notes (Signed)
    SUBJECTIVE:   CHIEF COMPLAINT / HPI: Hypertension  Hypertension associated with diabetes [facility]) Patient stating that her blood pressure was charted at systolic A999333 at the dentist today.  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  Here in our office systolic was charted at the mid 170s again she felt comfortable and said that she would know her blood pressure was high and that someone was telling her.  She has no chest pain or shortness of breath.  She says she has been taking all her medicine as prescribed and has stopped her HCTZ since her 5/10 visit with Dr. Ky Heath.  She is still taking her lisinopril 20.  PERTINENT  PMH / PSH:   OBJECTIVE:   BP (!) 174/118   Pulse 83   Wt 188 lb 9.6 oz (85.5 kg)   SpO2 99%   BMI 33.41 kg/m   General: Alert and pleasant, no distress, mentating well Cardiac: Regular rate and rhythm no murmur auscultated Pulmonary: No increased work of breathing, no cough, no audible wheeze  ASSESSMENT/PLAN:   Hypertension associated with diabetes [facility]) Patient stating that her blood pressure was charted at systolic A999333 at the dentist today.  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  Here in our office systolic was charted at the mid 170s again she felt comfortable and said that she would know her blood pressure was high and that someone was telling her.  She has no chest pain or shortness of breath.  She says she has been taking all her medicine as prescribed and has stopped her HCTZ since her 5/10 visit with Dr. Ky Heath.  She is still taking her lisinopril 20.  Her lightheadedness had resolved but I am not sure if hypotension was the cause of her lightheadedness in the past, she is however proving to be significantly hypertensive when only on lisinopril 20.  Of note she is still smoking but is willing to try to make  an effort to quit so I am prescribing 7 mg nicotine patches and sending this to the [facility] outpatient pharmacy.  Were also going to increase her lisinopril to 30 mg from 20.  Will obtain a BMP and TSH today, patient will come back on Friday for a follow-up visit with Dr. Chauncey Heath and obtain a second BMP to verify renal function is not being impacted by the increased dose of lisinopril.  May follow-up with PCP after that     Wendy Heath, [place]

## 2019-09-22 NOTE — Assessment & Plan Note (Signed)
Patient stating that her blood pressure was charted at systolic A999333 at the dentist today.  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  Here in our office systolic was charted at the mid 170s again she felt comfortable and said that she would know her blood pressure was high and that someone was telling her.  She has no chest pain or shortness of breath.  She says she has been taking all her medicine as prescribed and has stopped her HCTZ since her 5/10 visit with Dr. Ky Barban.  She is still taking her lisinopril 20.  Her lightheadedness had resolved but I am not sure if hypotension was the cause of her lightheadedness in the past, she is however proving to be significantly hypertensive when only on lisinopril 20.  Of note she is still smoking but is willing to try to make an effort to quit so I am prescribing 7 mg nicotine patches and sending this to the [facility] outpatient pharmacy.  Were also going to increase her lisinopril to 30 mg from 20.  Will obtain a BMP and TSH today, patient will come back on Friday for a follow-up visit with Dr. Chauncey Reading and obtain a second BMP to verify renal function is not being impacted by the increased dose of lisinopril.  May follow-up with PCP after that

## 2019-09-22 NOTE — Telephone Encounter (Signed)
Patient calls nurse line reporting elevated BP reading from dentist office this morning. Patient states that BP was 203/130. Patient states that she is currently asymptomatic. Patient originally scheduled for tomorrow with Dr. Tammi Klippel to discuss restarting BP medications. Patient re scheduled for this afternoon with Dr. Criss Rosales due to concerns for increased BP. Spoke with preceptor Sheppard Coil) regarding patient, agreed for patient to be evaluated in clinic this afternoon.   Strict ED precautions given.   To PCP and Dr. Dwaine Gale, RN

## 2019-09-23 ENCOUNTER — Ambulatory Visit: Payer: Medicare Other | Admitting: Family Medicine

## 2019-09-23 LAB — BASIC METABOLIC PANEL
BUN/Creatinine Ratio: 14 (ref 12–28)
BUN: 12 mg/dL (ref 8–27)
CO2: 26 mmol/L (ref 20–29)
Calcium: 9.9 mg/dL (ref 8.7–10.3)
Chloride: 105 mmol/L (ref 96–106)
Creatinine, Ser: 0.84 mg/dL (ref 0.57–1.00)
GFR calc Af Amer: 81 mL/min/{1.73_m2} (ref >59)
GFR calc non Af Amer: 71 mL/min/{1.73_m2} (ref >59)
Glucose: 83 mg/dL (ref 65–99)
Potassium: 3.3 mmol/L — ABNORMAL LOW (ref 3.5–5.2)
Sodium: 144 mmol/L (ref 134–144)

## 2019-09-23 LAB — TSH: TSH: 0.526 u[IU]/mL (ref 0.450–4.500)

## 2019-09-25 ENCOUNTER — Other Ambulatory Visit: Payer: Self-pay

## 2019-09-25 ENCOUNTER — Encounter: Payer: Self-pay | Admitting: Family Medicine

## 2019-09-25 ENCOUNTER — Ambulatory Visit (INDEPENDENT_AMBULATORY_CARE_PROVIDER_SITE_OTHER): Payer: Medicare Other | Admitting: Family Medicine

## 2019-09-25 VITALS — BP 138/94 | HR 90 | Ht 63.0 in | Wt 189.0 lb

## 2019-09-25 DIAGNOSIS — I10 Essential (primary) hypertension: Secondary | ICD-10-CM | POA: Diagnosis not present

## 2019-09-25 DIAGNOSIS — I152 Hypertension secondary to endocrine disorders: Secondary | ICD-10-CM

## 2019-09-25 DIAGNOSIS — Z0000 Encounter for general adult medical examination without abnormal findings: Secondary | ICD-10-CM

## 2019-09-25 DIAGNOSIS — E1159 Type 2 diabetes mellitus with other circulatory complications: Secondary | ICD-10-CM

## 2019-09-25 NOTE — Assessment & Plan Note (Addendum)
BP at [facility] goal of <150/90 today. Will continue Lisinopril 30 mg daily. - BMP obtained today, will follow up and can send potassium supplementation if low again - Follow up 1 month - Can return to normal dental care, letter written

## 2019-09-25 NOTE — Assessment & Plan Note (Signed)
Patient had ophthalmology appt last month and requested records to be sent 2 weeks ago  Patient had COVID-19 immunizations: Moderna - 1st shot 06/03/19 - 2nd shot 06/20/19  Otherwise UTD this year

## 2019-09-25 NOTE — Progress Notes (Signed)
    SUBJECTIVE:   CHIEF COMPLAINT / HPI: BP check  Patient presents today as follow up from Tuesday, 6/1, when her BP was measured by dentist's office where SBP was above 200. She then saw Dr. Criss Rosales and her SBP was >170, but asymptomatic. BMP obtained on 6/1 with mild hypokalemia to 3.1. Lisinopril increased from 20 to 30mg  daily.  Today patient has BP of 138/94, no symptoms, no dizziness or lightheadedness. Meeting goal of <150/90. Will continue with lisinopril 30 mg. Will repeat BMP today, can send potassium supplementation based on results. Will follow up in 1 month. Wrote letter that patient may return to regular dental care.  Of note: patient had ophthalmology exam last month and requested letter from them 2 weeks ago. She was also immunized against COVID-19, Moderna, 1st shot 06/03/19, 2nd shot 06/20/19. Up to date on all other HC maintenance.  PERTINENT  PMH / PSH: HTN  OBJECTIVE:   BP (!) 138/94   Pulse 90   Ht 5\' 3"  (1.6 m)   Wt 85.7 kg   SpO2 98%   BMI 33.48 kg/m   Physical Exam Vitals and nursing note reviewed.  Constitutional:      General: She is not in acute distress.    Appearance: Normal appearance. She is obese. She is not ill-appearing or toxic-appearing.  HENT:     Head: Normocephalic and atraumatic.  Eyes:     Conjunctiva/sclera: Conjunctivae normal.  Cardiovascular:     Rate and Rhythm: Normal rate and regular rhythm.     Pulses: Normal pulses.     Heart sounds: Normal heart sounds. No murmur. No friction rub. No gallop.   Pulmonary:     Effort: Pulmonary effort is normal.     Breath sounds: Normal breath sounds. No wheezing, rhonchi or rales.  Abdominal:     General: Abdomen is flat. Bowel sounds are normal. There is no distension.     Palpations: Abdomen is soft.     Tenderness: There is no abdominal tenderness.  Musculoskeletal:     Right lower leg: No edema.     Left lower leg: No edema.  Skin:    General: Skin is warm and dry.  Neurological:    General: No focal deficit present.     Mental Status: She is alert and oriented to person, place, and time. Mental status is at baseline.  Psychiatric:        Mood and Affect: Mood normal.        Behavior: Behavior normal.    ASSESSMENT/PLAN:  Wendy Heath is a 69 yo woman with HTN.  Hypertension associated with diabetes ([place]) BP at [facility] goal of <150/90 today. Will continue Lisinopril 30 mg daily. - BMP obtained today, will follow up and can send potassium supplementation if low again - Follow up 1 month - Can return to normal dental care, letter written  Healthcare maintenance Patient had ophthalmology appt last month and requested records to be sent 2 weeks ago  Patient had COVID-19 immunizations: Moderna - 1st shot 06/03/19 - 2nd shot 06/20/19  Otherwise UTD this year   Gladys Damme, MD [place]

## 2019-09-25 NOTE — Patient Instructions (Signed)
Your blood pressure is perfect, keep up the good work!  If you get dizzy, please call our office and let us know. You may also return to get dental care.  We got blood work today and if anything is abnormal, I will call you with results and recommendations.  Follow up in 1 month.  Be Well!

## 2019-09-26 LAB — BASIC METABOLIC PANEL
BUN/Creatinine Ratio: 12 (ref 12–28)
BUN: 11 mg/dL (ref 8–27)
CO2: 26 mmol/L (ref 20–29)
Calcium: 9.5 mg/dL (ref 8.7–10.3)
Chloride: 102 mmol/L (ref 96–106)
Creatinine, Ser: 0.9 mg/dL (ref 0.57–1.00)
GFR calc Af Amer: 75 mL/min/{1.73_m2} (ref >59)
GFR calc non Af Amer: 65 mL/min/{1.73_m2} (ref >59)
Glucose: 132 mg/dL — ABNORMAL HIGH (ref 65–99)
Potassium: 3.4 mmol/L — ABNORMAL LOW (ref 3.5–5.2)
Sodium: 142 mmol/L (ref 134–144)

## 2019-10-12 ENCOUNTER — Other Ambulatory Visit: Payer: Self-pay | Admitting: Family Medicine

## 2019-10-12 ENCOUNTER — Telehealth: Payer: Self-pay | Admitting: Family Medicine

## 2019-10-12 DIAGNOSIS — E876 Hypokalemia: Secondary | ICD-10-CM

## 2019-10-12 MED ORDER — POTASSIUM CHLORIDE 20 MEQ PO PACK: 20.0000 meq | PACK | Freq: Every day | ORAL | 0 refills | Status: DC

## 2019-10-12 MED ORDER — POTASSIUM CHLORIDE CRYS ER 20 MEQ PO TBCR: 20.0000 meq | EXTENDED_RELEASE_TABLET | Freq: Every day | ORAL | 0 refills | Status: DC

## 2019-10-12 NOTE — Telephone Encounter (Signed)
Called patient to discuss BMP results which showed low potassium due to diuretic for high blood pressure. Will prescribe klor-con 20 mEq once daily. We have an appointment scheduled for July 2nd, where I will check BMP to ensure improvement in potassium, and we can discuss options for blood pressure management further. Patient agreed expressed understanding.  Gladys Damme, MD [facility], PGY-1

## 2019-10-23 ENCOUNTER — Other Ambulatory Visit: Payer: Self-pay

## 2019-10-23 ENCOUNTER — Ambulatory Visit: Payer: Medicare Other | Admitting: Family Medicine

## 2019-10-23 ENCOUNTER — Encounter: Payer: Self-pay | Admitting: Family Medicine

## 2019-10-23 ENCOUNTER — Ambulatory Visit (INDEPENDENT_AMBULATORY_CARE_PROVIDER_SITE_OTHER): Payer: Medicare Other | Admitting: Family Medicine

## 2019-10-23 VITALS — BP 140/100 | HR 79 | Ht 63.0 in | Wt 188.2 lb

## 2019-10-23 DIAGNOSIS — I152 Hypertension secondary to endocrine disorders: Secondary | ICD-10-CM

## 2019-10-23 DIAGNOSIS — E876 Hypokalemia: Secondary | ICD-10-CM

## 2019-10-23 DIAGNOSIS — E1159 Type 2 diabetes mellitus with other circulatory complications: Secondary | ICD-10-CM | POA: Diagnosis not present

## 2019-10-23 DIAGNOSIS — M7541 Impingement syndrome of right shoulder: Secondary | ICD-10-CM | POA: Diagnosis not present

## 2019-10-23 DIAGNOSIS — I10 Essential (primary) hypertension: Secondary | ICD-10-CM

## 2019-10-23 NOTE — Patient Instructions (Signed)
It was a pleasure to see you!  You have what is called shoulder impingement syndrome. I would rest your arm, so no major activities, don't lift anything more than 3 lbs on that side, and use ice and voltaren gel for pain. You may also use tylenol.   I have referred you to sports medicine, they will call you next week to schedule an appointment.   We are getting blood work to check in on your potassium today and I will call you with any abnormal results.   Be Well!  Dr. Chauncey Reading   Shoulder Impingement Syndrome  Shoulder impingement syndrome is a condition that causes pain when connective tissues (tendons) surrounding the shoulder joint become pinched. These tendons are part of the group of muscles and tissues that help to stabilize the shoulder (rotator cuff). Beneath the rotator cuff is a fluid-filled sac (bursa) that allows the muscles and tendons to glide smoothly. The bursa may become swollen or irritated (bursitis). Bursitis, swelling in the rotator cuff tendons, or both conditions can decrease how much space is under a bone in the shoulder joint (acromion), resulting in impingement. What are the causes? Shoulder impingement syndrome may be caused by bursitis or swelling of the rotator cuff tendons, which may result from:  Repetitive overhead arm movements.  Falling onto the shoulder.  Weakness in the shoulder muscles. What increases the risk? You may be more likely to develop this condition if you:  Play sports that involve throwing, such as baseball.  Participate in sports such as tennis, volleyball, and swimming.  Work as a Curator, Games developer, or Architect. Some people are also more likely to develop impingement syndrome because of the shape of their acromion bone. What are the signs or symptoms? The main symptom of this condition is pain on the front or side of the shoulder. The pain may:  Get worse when lifting or raising the arm.  Get worse at night.  Wake you up  from sleeping.  Feel sharp when the shoulder is moved and then fade to an ache. Other symptoms may include:  Tenderness.  Stiffness.  Inability to raise the arm above shoulder level or behind the body.  Weakness. How is this diagnosed? This condition may be diagnosed based on:  Your symptoms and medical history.  A physical exam.  Imaging tests, such as: ? X-rays. ? MRI. ? Ultrasound. How is this treated? This condition may be treated by:  Resting your shoulder and avoiding all activities that cause pain or put stress on the shoulder.  Icing your shoulder.  NSAIDs to help reduce pain and swelling.  One or more injections of medicines to numb the area and reduce inflammation.  Physical therapy.  Surgery. This may be needed if nonsurgical treatments have not helped. Surgery may involve repairing the rotator cuff, reshaping the acromion, or removing the bursa. Follow these instructions at home: Managing pain, stiffness, and swelling   If directed, put ice on the injured area. ? Put ice in a plastic bag. ? Place a towel between your skin and the bag. ? Leave the ice on for 20 minutes, 2-3 times a day. Activity  Rest and return to your normal activities as told by your health care provider. Ask your health care provider what activities are safe for you.  Do exercises as told by your health care provider. General instructions  Do not use any products that contain nicotine or tobacco, such as cigarettes, e-cigarettes, and chewing tobacco. These can delay  healing. If you need help quitting, ask your health care provider.  Ask your health care provider when it is safe for you to drive.  Take over-the-counter and prescription medicines only as told by your health care provider.  Keep all follow-up visits as told by your health care provider. This is important. How is this prevented?  Give your body time to rest between periods of activity.  Be safe and responsible  while being active. This will help you avoid falls.  Maintain physical fitness, including strength and flexibility. Contact a health care provider if:  Your symptoms have not improved after 1-2 months of treatment and rest.  You cannot lift your arm away from your body. Summary  Shoulder impingement syndrome is a condition that causes pain when connective tissues (tendons) surrounding the shoulder joint become pinched.  The main symptom of this condition is pain on the front or side of the shoulder.  This condition is usually treated with rest, ice, and pain medicines as needed. This information is not intended to replace advice given to you by your health care provider. Make sure you discuss any questions you have with your health care provider. Document Revised: 08/01/2018 Document Reviewed: 10/02/2017 Elsevier Patient Education  2020 [employer].

## 2019-10-23 NOTE — Progress Notes (Signed)
SUBJECTIVE:   CHIEF COMPLAINT / HPI: htn f/u  HTN: Patient has not had any dizziness, falls, headaches, vision changes.  Her blood pressure is around goal of less than 150/90.  Will obtain BMP to assess electrolytes as she had low potassium at her last visit.  Patient is not currently checking her blood pressure at home, recommend that she start doing so.  Right shoulder pain: Patient reports right shoulder pain for about 1 week.  She cannot think of any inciting event that causes pain, no trauma no falls, no yard work.  Pain is a constant dull ache that worsens when she lifts her arm above 90 degrees.  She has used Voltaren gel, ice, and Tylenol which has provided relief, though not lasting.  She has never had anything like this before.  PERTINENT  PMH / PSH: HTN  OBJECTIVE:   BP (!) 140/100 Comment: Pt states she smoked a cigarette. provider informed  Pulse 79   Ht 5\' 3"  (1.6 m)   Wt 188 lb 4 oz (85.4 kg)   LMP  (Approximate)   SpO2 99%   BMI 33.35 kg/m   Physical Exam Vitals and nursing note reviewed.  Constitutional:      General: She is not in acute distress.    Appearance: Normal appearance. She is obese. She is not ill-appearing or toxic-appearing.  HENT:     Head: Normocephalic and atraumatic.  Cardiovascular:     Rate and Rhythm: Normal rate and regular rhythm.     Pulses: Normal pulses.     Heart sounds: Normal heart sounds. No murmur heard.  No friction rub. No gallop.   Pulmonary:     Effort: Pulmonary effort is normal.     Breath sounds: Normal breath sounds. No wheezing, rhonchi or rales.  Musculoskeletal:     Comments: RUE: tenderness to palpation over glenuhumeral joint, no warmth, erythema, or edema. Pain present with active and passive ROM, worse >90*. Positive painful arc, positive Hawkins-Kennedy test, negative empty can test. Full ROM of neck.  LUE: no tenderness to palpation, no warmth, erythema, or edema. Full ROM both passive and active. Negative  painful arc, Hawkins-Kennedy test, empty can test, full ROM of neck.  Skin:    General: Skin is warm and dry.  Neurological:     General: No focal deficit present.     Mental Status: She is alert and oriented to person, place, and time. Mental status is at baseline.  Psychiatric:        Mood and Affect: Mood normal.        Behavior: Behavior normal.    ASSESSMENT/PLAN:   Hypertension associated with diabetes ([place]) Hypertension: very close to goal, no e/o severely high blood pressure. No change to management at this time, especially as she has had symptomatic hypotension in the past. - Medications: lisinopril - Compliance: good - Checking BP at home: no, discussed with patient - Denies any SOB, CP, vision changes, LE edema, medication SEs, or symptoms of hypotension - Diet: diabetic - Exercise: limited due to R shoulder injury recently, recommend continue walking - BMP with stable electrolytes today   Hypokalemia Patient previously had hypokalemia in 2019, which was theorized to be due to HCTZ use. However, hypokalemia was still present at last visit when she had been off HCTZ for approximately 2 months (April to June, 2021). Patient currently taking 70mEq klor-con x11 days and now potassium WNL of 4.0. Will recheck in 6 months.   Shoulder impingement  syndrome, right Likely dx given demographics, positive painful arc, positive Michel Bickers test.  Recommend patient continue using ice, Voltaren gel, and Tylenol.  Patient reports that she was told by her doctor in [place] not to use ibuprofen, cannot find a reason given her history for why this would be.  However since patient prefers not to use ibuprofen, recommend continuing Tylenol and topical Voltaren gel.  Patient likely good candidate for injection. -Refer to sports medicine    Gladys Damme, [place]

## 2019-10-24 ENCOUNTER — Encounter: Payer: Self-pay | Admitting: Family Medicine

## 2019-10-24 DIAGNOSIS — M7541 Impingement syndrome of right shoulder: Secondary | ICD-10-CM | POA: Insufficient documentation

## 2019-10-24 LAB — BASIC METABOLIC PANEL
BUN/Creatinine Ratio: 10 — ABNORMAL LOW (ref 12–28)
BUN: 9 mg/dL (ref 8–27)
CO2: 25 mmol/L (ref 20–29)
Calcium: 10 mg/dL (ref 8.7–10.3)
Chloride: 102 mmol/L (ref 96–106)
Creatinine, Ser: 0.94 mg/dL (ref 0.57–1.00)
GFR calc Af Amer: 71 mL/min/{1.73_m2} (ref >59)
GFR calc non Af Amer: 62 mL/min/{1.73_m2} (ref >59)
Glucose: 86 mg/dL (ref 65–99)
Potassium: 4 mmol/L (ref 3.5–5.2)
Sodium: 142 mmol/L (ref 134–144)

## 2019-10-24 NOTE — Assessment & Plan Note (Signed)
Patient previously had hypokalemia in 2019, which was theorized to be due to HCTZ use. However, hypokalemia was still present at last visit when she had been off HCTZ for approximately 2 months (April to June, 2021). Patient currently taking 53mEq klor-con x11 days and now potassium WNL of 4.0. Will recheck in 6 months.

## 2019-10-24 NOTE — Assessment & Plan Note (Signed)
Likely dx given demographics, positive painful arc, positive Michel Bickers test.  Recommend patient continue using ice, Voltaren gel, and Tylenol.  Patient reports that she was told by her doctor in [place] not to use ibuprofen, cannot find a reason given her history for why this would be.  However since patient prefers not to use ibuprofen, recommend continuing Tylenol and topical Voltaren gel.  Patient likely good candidate for injection. -Refer to sports medicine

## 2019-10-24 NOTE — Assessment & Plan Note (Addendum)
Hypertension: very close to goal, no e/o severely high blood pressure. No change to management at this time, especially as she has had symptomatic hypotension in the past. - Medications: lisinopril - Compliance: good - Checking BP at home: no, discussed with patient - Denies any SOB, CP, vision changes, LE edema, medication SEs, or symptoms of hypotension - Diet: diabetic - Exercise: limited due to R shoulder injury recently, recommend continue walking - BMP with stable electrolytes today

## 2019-10-27 ENCOUNTER — Other Ambulatory Visit: Payer: Self-pay | Admitting: Family Medicine

## 2019-11-04 ENCOUNTER — Ambulatory Visit (INDEPENDENT_AMBULATORY_CARE_PROVIDER_SITE_OTHER): Payer: Medicare Other | Admitting: Family Medicine

## 2019-11-04 ENCOUNTER — Other Ambulatory Visit: Payer: Self-pay

## 2019-11-04 ENCOUNTER — Ambulatory Visit: Payer: Self-pay

## 2019-11-04 VITALS — BP 178/103 | Ht 63.0 in | Wt 188.0 lb

## 2019-11-04 DIAGNOSIS — M7581 Other shoulder lesions, right shoulder: Secondary | ICD-10-CM | POA: Diagnosis not present

## 2019-11-04 DIAGNOSIS — M25511 Pain in right shoulder: Secondary | ICD-10-CM

## 2019-11-04 MED ORDER — METHYLPREDNISOLONE ACETATE 40 MG/ML IJ SUSP
40.0000 mg | Freq: Once | INTRAMUSCULAR | Status: AC
  Administered 2019-11-04: 40 mg via INTRA_ARTICULAR

## 2019-11-04 NOTE — Progress Notes (Signed)
PCP: Gladys Damme, MD  Subjective:   HPI: Patient is a 69 y.o. female here for R shoulder pain. She reports that she woke up one morning a couple weeks ago with pain in her R shoulder. No obvious inciting event, no new activities. She has pain over her lateral shoulder without any radiation. The pain is fairly constant but is worsened by reaching over her head. She has been using topical diclofenac and PO tylenol without much relief.   Past Medical History:  Diagnosis Date  . Abnormal mammogram of left breast 05/28/2019   12/2018 Dg MM: Diagnostic left mammogram is recommended in 6 months to confirm stability of Probably benign calcifications at the lumpectomy site in the left breast without definite appreciable change compared to multiple priors.    . Blind right eye 07/04/2011   Since stroke 2003  . CEREBROVASCULAR ACCIDENT, HX OF 08/24/2006  . Chronic pain of both knees 12/28/2008   Qualifier: Diagnosis of  By: Hassell Done FNP, Tori Milks    . Diverticulitis   . Dizziness 05/16/2018  . GERD 08/24/2006  . History of left breast cancer 03/25/2015  . HYPERLIPIDEMIA 08/24/2006  . Hyperlipidemia associated with type 2 diabetes mellitus ([place]) 08/24/2006   Qualifier: Diagnosis of  By: Radene Ou MD, [place]    . HYPERTENSION 08/24/2006  . Hypertension associated with diabetes ([place]) 08/24/2006   Qualifier: Diagnosis of  By: Radene Ou MD, [place]    . Impaired glucose tolerance 07/04/2011  . Malignant neoplasm of breast (female), unspecified site 03/13/2011  . OBESITY 12/28/2008   Qualifier: Diagnosis of  By: Hassell Done FNP, Tori Milks    . Pain in both thighs 04/01/2018  . Personal history of chemotherapy   . Personal history of radiation therapy 12/26/2006  . Postural hypotension 06/19/2018  . Seasonal allergies 05/14/2012  . Stroke ([place]) 2005  . Type 2 diabetes mellitus without complications ([place]) 8/78/6767    Current Outpatient Medications on File Prior to Visit  Medication Sig Dispense Refill  . aspirin 81 MG  tablet Take 81 mg by mouth daily.     . cetirizine (ZYRTEC) 10 MG tablet Take 1 tablet (10 mg total) by mouth daily. 90 tablet 3  . cholecalciferol (VITAMIN D) 400 UNITS TABS Take 1,000 Units by mouth daily.     . diclofenac sodium (VOLTAREN) 1 % GEL APPLY 2 GM TOPICALLY 4 (FOUR) TIMES DAILY as needed 100 g 1  . famotidine (PEPCID) 20 MG tablet TAKE 1 TABLET BY MOUTH  DAILY 90 tablet 3  . fluticasone (FLONASE) 50 MCG/ACT nasal spray USE 2 SPRAYS IN BOTH  NOSTRILS DAILY 16 g 1  . lisinopril (ZESTRIL) 30 MG tablet Take 1 tablet (30 mg total) by mouth daily. 30 tablet 0  . nicotine (NICODERM CQ - DOSED IN MG/24 HR) 7 mg/24hr patch Place 1 patch (7 mg total) onto the skin daily. 28 patch 0  . potassium chloride (KLOR-CON) 20 MEQ packet Take 20 mEq by mouth daily. 30 each 0  . potassium chloride SA (KLOR-CON) 20 MEQ tablet Take 1 tablet (20 mEq total) by mouth daily. 30 tablet 0  . rosuvastatin (CRESTOR) 10 MG tablet Take 1 tablet (10 mg total) by mouth daily. 90 tablet 3   No current facility-administered medications on file prior to visit.    Past Surgical History:  Procedure Laterality Date  . ABDOMINAL HYSTERECTOMY  1995  . BREAST BIOPSY  06/10/2006   malignant  . BREAST LUMPECTOMY Left 11/25/2006   malignant  . COLONOSCOPY N/A 02/05/2013  Procedure: COLONOSCOPY;  Surgeon: Beryle Beams, MD;  Location: [facility];  Service: Endoscopy;  Laterality: N/A;    No Known Allergies  Social History   Socioeconomic History  . Marital status: Divorced    Spouse name: Not on file  . Number of children: 3  . Years of education: 40  . Highest education level: Not on file  Occupational History    Employer: UNEMPLOYED  Tobacco Use  . Smoking status: Current Every Day Smoker    Packs/day: 0.25    Years: 50.00    Pack years: 12.50    Types: Cigarettes  . Smokeless tobacco: Never Used  Vaping Use  . Vaping Use: Never used  Substance and Sexual Activity  . Alcohol use: Yes    Comment:  Once a month.   . Drug use: No  . Sexual activity: Not Currently  Other Topics Concern  . Not on file  Social History Narrative   Family/Social Information   patient lives alone . Patient enjoys reading, listening to music and watching TV . Primary family support persons is son Vincente Liberty.    Transportation to appointments provided by daughter and granddaughter.   Receives the following community support services Social Security , food and nutritions benefits.    Strengths: Armed forces logistics/support/administrative officer, Special hobby/interest, Supportive family/friends   primary caregiver? son.  Completed Caregiver stress assessment with a score of 18 indication of little to no stress.      Casimer Lanius, LCSW   Clinical Social Worker      Social Determinants of Health   Financial Resource Strain: Medium Risk  . Difficulty of Paying Living Expenses: Somewhat hard  Food Insecurity: Food Insecurity Present  . Worried About Charity fundraiser in the Last Year: Sometimes true  . Ran Out of Food in the Last Year: Sometimes true  Transportation Needs: Unmet Transportation Needs  . Lack of Transportation (Medical): Yes  . Lack of Transportation (Non-Medical): Yes  Physical Activity: Inactive  . Days of Exercise per Week: 0 days  . Minutes of Exercise per Session: 0 min  Stress:   . Feeling of Stress :   Social Connections:   . Frequency of Communication with Friends and Family:   . Frequency of Social Gatherings with Friends and Family:   . Attends Religious Services:   . Active Member of Clubs or Organizations:   . Attends Archivist Meetings:   [name] Marital Status:   Intimate Partner Violence:   . Fear of Current or Ex-Partner:   . Emotionally Abused:   [name] Physically Abused:   . Sexually Abused:     Family History  Problem Relation Age of Onset  . Stroke Other   . Cancer Other        breast cancer  . Cancer Other        breast cancer  . Stroke Other   . Hypertension Other   . Diabetes  Other   . Sudden death Other   . Breast cancer Mother   . Heart disease Father   . Breast cancer Sister   . Heart disease Brother     BP (!) 178/103   Ht 5\' 3"  (1.6 m)   Wt 188 lb (85.3 kg)   BMI 33.30 kg/m   Review of Systems: See HPI above.     Objective:  Physical Exam:  Gen: NAD, comfortable in exam room  Shoulder: Inspection reveals no obvious deformity, atrophy, or asymmetry. No bruising. No swelling Palpation  is normal with no TTP over [facility] joint or bicipital groove. Decreased ROM in abduction (0-100 degrees), flexion (0-90 deg), minimal internal rotation both passively and actively.  NV intact distally Normal scapular function observed. Special Tests:  - Impingement: Pos Hawkins, Neg neers, Neg empty can sign. - Supraspinatus: Negative empty can.  5/5 strength with resisted flexion at 20 degrees - Infraspinatus/Teres Minor: 5/5 strength with ER - Subscapularis: 5/5 strength with IR - Biceps tendon: Neg Speeds, Neg Yerrgason's  - Labrum: Negative Obriens, good stability - + painful arc and no drop arm sign  Complete US of R shoulder: Biceps tendon visualized and intact long and trans views, pectoralis major tendon also identified and intact In external rotation, subscapularis was identified, and did show some irregularity without obvious tear consistent with tendinopathy. Dynamic evaluation did not show impingement Infraspinatus and teres minor were then visualized and followed to insertion on humeral head, no irregularity Supraspinatus was then idenitified and showed a partial tear on the articular surface, with irregularity of the remaining tendon.   Assessment & Plan:  1. R Rotator cuff partial tear 2. R rotator cuff tendinopathy  Pt with tendinopathy and partial tear of spraspinatus tendon. Suspect chronic tendinopathy/degeneration with subsequent partial tear after a minor activity. We discussed options and pt elected to proceed with CSI given severity of  pain, understanding the risk of exacerbating the partial tear. We will also plan to proceed with home exercise program.  Procedure Note: After informed written consent timeout was performed, patient was seated in chair in exam room. Right shoulder was prepped with alcohol swab and utilizing lateral approach with ultrasound guidance, patient's right subacromial space was injected with 3:1 bupivicaine: depomedrol. Patient tolerated the procedure well without immediate complications.

## 2019-11-04 NOTE — Patient Instructions (Signed)
You have rotator cuff impingement and tendinopathy. You also have a partial tear of you rotator cuff tendon (the supraspinatus tendon). You received a steroid injection today, which will hopefully relieve some of the pain. Please continue to do physical therapy at home and advance your activities as tolerated. You may continue to use tylenol and topical voltaren cream for pain. Please return to the clinic of your symptoms are not improving with time and we could consider going to a Physical Therapy office.

## 2019-11-05 ENCOUNTER — Encounter: Payer: Self-pay | Admitting: Family Medicine

## 2019-11-27 ENCOUNTER — Telehealth: Payer: Self-pay

## 2019-11-27 ENCOUNTER — Other Ambulatory Visit: Payer: Self-pay | Admitting: Family Medicine

## 2019-11-27 DIAGNOSIS — M7541 Impingement syndrome of right shoulder: Secondary | ICD-10-CM

## 2019-11-27 DIAGNOSIS — R921 Mammographic calcification found on diagnostic imaging of breast: Secondary | ICD-10-CM

## 2019-11-27 MED ORDER — GABAPENTIN 100 MG PO CAPS: 100.0000 mg | ORAL_CAPSULE | Freq: Three times a day (TID) | ORAL | 0 refills | Status: DC

## 2019-11-27 NOTE — Telephone Encounter (Signed)
Patient calls nurse line reporting continued pain in shoulder. Patient reports she has been taking Tylenol and using Voltaren gel with minimal relief. Patient reports she did have an injection, however not seeing any "signficant" changes. Patient is requesting Gabapentin as this has helped her in the past. Will forward to provider.

## 2019-11-27 NOTE — Telephone Encounter (Signed)
Sent patient a Therapist, music. Although it is not typically used for this problem, there is no harm in using low dose gabapentin. I will send in gabapentin 100 mg TID for 1 month to assess for improvement. Recommended she schedule follow up with either me or Sports Medicine for this problem as it was recommended if no improvement. Precepted with Dr. McDiarmid.  Gladys Damme, MD [place] [facility], PGY-2

## 2019-12-11 ENCOUNTER — Other Ambulatory Visit: Payer: Self-pay

## 2019-12-11 ENCOUNTER — Encounter: Payer: Self-pay | Admitting: Family Medicine

## 2019-12-11 ENCOUNTER — Ambulatory Visit (INDEPENDENT_AMBULATORY_CARE_PROVIDER_SITE_OTHER): Payer: Medicare Other | Admitting: Family Medicine

## 2019-12-11 VITALS — BP 172/104 | HR 70 | Ht 63.5 in | Wt 191.8 lb

## 2019-12-11 DIAGNOSIS — M7541 Impingement syndrome of right shoulder: Secondary | ICD-10-CM

## 2019-12-11 DIAGNOSIS — E1159 Type 2 diabetes mellitus with other circulatory complications: Secondary | ICD-10-CM

## 2019-12-11 DIAGNOSIS — R2231 Localized swelling, mass and lump, right upper limb: Secondary | ICD-10-CM

## 2019-12-11 DIAGNOSIS — I152 Hypertension secondary to endocrine disorders: Secondary | ICD-10-CM

## 2019-12-11 DIAGNOSIS — D1721 Benign lipomatous neoplasm of skin and subcutaneous tissue of right arm: Secondary | ICD-10-CM | POA: Diagnosis not present

## 2019-12-11 DIAGNOSIS — I10 Essential (primary) hypertension: Secondary | ICD-10-CM | POA: Diagnosis not present

## 2019-12-11 MED ORDER — AMLODIPINE BESYLATE 5 MG PO TABS: 5.0000 mg | ORAL_TABLET | Freq: Every day | ORAL | 3 refills | Status: DC

## 2019-12-11 MED ORDER — GABAPENTIN 300 MG PO CAPS: 300.0000 mg | ORAL_CAPSULE | Freq: Two times a day (BID) | ORAL | 1 refills | Status: DC

## 2019-12-11 MED ORDER — GABAPENTIN 300 MG PO CAPS: 300.0000 mg | ORAL_CAPSULE | Freq: Every day | ORAL | 1 refills | Status: DC

## 2019-12-11 NOTE — Patient Instructions (Addendum)
It was a pleasure to see you today!  For your arm pain: take gabapentin 300 mg BID for arm pain  For high blood pressure: take your lisinopril as usual and start taking amlodipine, 5 mg, once daily.  Follow up on 01/05/20 at 2:45 PM for arm pain and high blood pressure.  I will refer you to a hand surgeon, they will call to schedule an appointment. If you haven't heard from that office within 2 weeks, call us and we can help.  Feel Better!  Dr. Chauncey Reading

## 2019-12-14 NOTE — Progress Notes (Signed)
SUBJECTIVE:   CHIEF COMPLAINT / HPI: right arm/shoulder pain  RUE: patient previously diagnosed with rotator cuff syndrome, referred to sports medicine who confirmed with [place] partial tear of supraspinatus and given injection. Patient presents today with intermittent, intense, sharp pain down right arm. She has used gabapentin in the past for similar pain and reports that this greatly eased her pain and she would like to do that again. She also notes two "bumps" in her right arm: one in her fore arm, and one in her hand above her thumb. Both of these areas have been present for many years. 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 She has some pain in her hand, but most is located in the shoulder and radiates down the arm.  HTN: Patient has elevated BP today, denies vision changes, HA's, new speech or movement problems. She asserts that she has taken her lisinopril every day, but forgot it yesterday. She reports taking lisinopril this morning.  PERTINENT  PMH / PSH: HTN, h/o CVA, rotator cuff syndrome  OBJECTIVE:   BP (!) 172/104   Pulse 70   Ht 5' 3.5" (1.613 m)   Wt 87 kg   SpO2 99%   BMI 33.44 kg/m   Physical Exam Vitals and nursing note reviewed.  Constitutional:      General: She is not in acute distress.    Appearance: Normal appearance. She is obese. She is not ill-appearing, toxic-appearing or diaphoretic.  HENT:     Head: Normocephalic and atraumatic.  Cardiovascular:     Rate and Rhythm: Normal rate and regular rhythm.     Pulses: Normal pulses.     Heart sounds: Normal heart sounds. No murmur heard.  No friction rub. No gallop.   Pulmonary:     Effort: Pulmonary effort is normal.     Breath sounds: Normal breath sounds.  Musculoskeletal:     Comments: RUE: No warmth, erythema, or edema; 2cm firm nodule on dorsal aspect of right hand displacing thumb; 2 cm soft nodule on  lateral, dorsal aspect of forearm. no tenderness to palpation along glenohumeral and AC joints, limited ROM with abduction, DTRs intact.  Skin:    General: Skin is warm and dry.  Neurological:     General: No focal deficit present.     Mental Status: She is alert. Mental status is at baseline.  Psychiatric:        Mood and Affect: Mood normal.        Behavior: Behavior normal.    ASSESSMENT/PLAN:   Shoulder impingement syndrome, right Most of arm pain likely related to rotator cuff syndrome. Patient has a serum creatinine WNL, low risk to use gabapentin, although typically better in nerve pain. Patient reports she had minimal improvement after injection, encouraged patient to follow up with sports medicine. Precepted with Dr. Nori Riis. - gabapentin 300 mg BID - f/u with sports medicine  Nodule of skin of right hand Soft, non-mobile subcutaneous nodule of right hand on dorsal aspect along MCP. Patient reports long history, but now interfering with positioning of thumb, although function still intact. Recommend referral to hand surgeon. Precepted with Dr. Nori Riis - referral to hand surgery  Lipoma of right forearm 2cm firm nodule on dorsal aspect of right forearm near [facility] fossa, most likely a lipoma. Patient reports it has been present for many years, currently does not cause pain or interfere with  function. Will continue to monitor.  Hypertension associated with diabetes ([place]) BP elevated to 170/90 today, patient denies SOB, CP, vision changes, LE edema. - recommend continued compliance with lisinopril - start checking BP at home - start amlodipine 5 mg qhs - f/u 01/05/20 - return precautions given     Gladys Damme, MD [place]

## 2019-12-15 ENCOUNTER — Encounter: Payer: Self-pay | Admitting: Family Medicine

## 2019-12-15 DIAGNOSIS — R2231 Localized swelling, mass and lump, right upper limb: Secondary | ICD-10-CM | POA: Insufficient documentation

## 2019-12-15 DIAGNOSIS — D1721 Benign lipomatous neoplasm of skin and subcutaneous tissue of right arm: Secondary | ICD-10-CM | POA: Insufficient documentation

## 2019-12-15 HISTORY — DX: Localized swelling, mass and lump, right upper limb: R22.31

## 2019-12-15 NOTE — Assessment & Plan Note (Signed)
Most of arm pain likely related to rotator cuff syndrome. Patient has a serum creatinine WNL, low risk to use gabapentin, although typically better in nerve pain. Patient reports she had minimal improvement after injection, encouraged patient to follow up with sports medicine. Precepted with Dr. Nori Riis. - gabapentin 300 mg BID - f/u with sports medicine

## 2019-12-15 NOTE — Assessment & Plan Note (Signed)
2cm firm nodule on dorsal aspect of right forearm near AC fossa, most likely a lipoma. Patient reports it has been present for many years, currently does not cause pain or interfere with function. Will continue to monitor.

## 2019-12-15 NOTE — Assessment & Plan Note (Signed)
Soft, non-mobile subcutaneous nodule of right hand on dorsal aspect along MCP. Patient reports long history, but now interfering with positioning of thumb, although function still intact. Recommend referral to hand surgeon. Precepted with Dr. Nori Riis - referral to hand surgery

## 2019-12-15 NOTE — Assessment & Plan Note (Signed)
BP elevated to 170/90 today, patient denies SOB, CP, vision changes, LE edema. - recommend continued compliance with lisinopril - start checking BP at home - start amlodipine 5 mg qhs - f/u 01/05/20 - return precautions given

## 2019-12-29 ENCOUNTER — Ambulatory Visit
Admission: RE | Admit: 2019-12-29 | Discharge: 2019-12-29 | Disposition: A | Discharge status: Home / Self Care | Payer: Medicare Other | Source: Ambulatory Visit | Attending: Family Medicine | Admitting: Family Medicine

## 2019-12-29 ENCOUNTER — Other Ambulatory Visit: Payer: Self-pay | Admitting: Family Medicine

## 2019-12-29 ENCOUNTER — Other Ambulatory Visit: Payer: Self-pay

## 2019-12-29 DIAGNOSIS — R921 Mammographic calcification found on diagnostic imaging of breast: Secondary | ICD-10-CM

## 2020-01-05 ENCOUNTER — Other Ambulatory Visit: Payer: Self-pay

## 2020-01-05 ENCOUNTER — Encounter: Payer: Self-pay | Admitting: Family Medicine

## 2020-01-05 ENCOUNTER — Ambulatory Visit (INDEPENDENT_AMBULATORY_CARE_PROVIDER_SITE_OTHER): Payer: Medicare Other | Admitting: Family Medicine

## 2020-01-05 VITALS — BP 138/82 | HR 88 | Ht 63.0 in | Wt 184.4 lb

## 2020-01-05 DIAGNOSIS — S46001D Unspecified injury of muscle(s) and tendon(s) of the rotator cuff of right shoulder, subsequent encounter: Secondary | ICD-10-CM | POA: Diagnosis not present

## 2020-01-05 DIAGNOSIS — E119 Type 2 diabetes mellitus without complications: Secondary | ICD-10-CM

## 2020-01-05 DIAGNOSIS — R928 Other abnormal and inconclusive findings on diagnostic imaging of breast: Secondary | ICD-10-CM | POA: Diagnosis not present

## 2020-01-05 DIAGNOSIS — Z23 Encounter for immunization: Secondary | ICD-10-CM | POA: Diagnosis not present

## 2020-01-05 DIAGNOSIS — R2231 Localized swelling, mass and lump, right upper limb: Secondary | ICD-10-CM | POA: Diagnosis not present

## 2020-01-05 DIAGNOSIS — M7541 Impingement syndrome of right shoulder: Secondary | ICD-10-CM

## 2020-01-05 LAB — POCT GLYCOSYLATED HEMOGLOBIN (HGB A1C): HbA1c, POC (controlled diabetic range): 5.7 % (ref 0.0–7.0)

## 2020-01-05 MED ORDER — TRAMADOL HCL 50 MG PO TABS: 50.0000 mg | ORAL_TABLET | Freq: Every evening | ORAL | 0 refills | Status: DC | PRN

## 2020-01-05 NOTE — Assessment & Plan Note (Signed)
Patient with known rotator cuff syndrome. She requested gabapentin to see if it helped with pain, but she reports that it only offered her minimal relief. Patient has tried injections as well, which did not help. She has not discussed possible surgery for repair, but has had significant quality of life disturbance via sleep interruption and pain. She has tried ibuprofen, voltaren gel, tylenol, and other conservative measures with very minimal relief. Precepted with Dr. Nori Riis who recommends trial of low dose tramadol at night to help patient get sleep along with referral to Dr. Marchia Bond at [facility] for consultation on rotator cuff repair. - Tramadol 50 mg qhs prn for pain. Counseled patient not to drive or drink EtOH with this medication, may use 1/2 pill to start - Salonpas patch sample given

## 2020-01-05 NOTE — Assessment & Plan Note (Signed)
Current Regimen: metformin 500 mg qd CBGs: unknown Last A1c: 5.7% today Denies polyuria, polydipsia, hypoglycemia  Last Eye Exam: within the last year Statin: Rosuvastatin 10 mg qd ACE/ARB: lisinopril 20 mg qd  Follow up 6 months

## 2020-01-05 NOTE — Assessment & Plan Note (Signed)
Patient had mammogram on 9/7 to follow up calcifications in L breast from 3/21. Diagnostic mammogram read confirms benign calcifications in L breast. They recommend 1 year follow up. No mammographic evidence of malignancy in the right breast.

## 2020-01-05 NOTE — Progress Notes (Signed)
SUBJECTIVE:   CHIEF COMPLAINT / HPI: shoulder pain  DM: check A1c today  HTN: patient BP appropriate today. She denies any HA, SOB, CP, or other problem.  Flu shot: patient would like to get today  R shoulder pain: Patient has known rotator cuff tear with impingement. She describes aching throbbing pain that awakens her from sleep nightly. Patient has not been able to obtain a good night's sleep due to the pain. She also cannot lift her arm high enough to put her bra on.   R hand nodule: patient has not heard from [facility] for referral to Dr. Amedeo Plenty  PERTINENT  PMH / [place]: DM, HTN, rotator cuff injury to R shoulder  OBJECTIVE:   BP 138/82   Pulse 88   Ht 5\' 3"  (1.6 m)   Wt 184 lb 6.4 oz (83.6 kg)   SpO2 98%   BMI 32.66 kg/m   Physical Exam Vitals and nursing note reviewed.  Constitutional:      General: She is not in acute distress.    Appearance: Normal appearance. She is obese. She is not ill-appearing, toxic-appearing or diaphoretic.  HENT:     Head: Normocephalic and atraumatic.  Cardiovascular:     Rate and Rhythm: Normal rate and regular rhythm.     Pulses: Normal pulses.     Heart sounds: Normal heart sounds. No murmur heard.  No friction rub. No gallop.   Pulmonary:     Effort: Pulmonary effort is normal. No respiratory distress.     Breath sounds: No wheezing, rhonchi or rales.  Abdominal:     General: Abdomen is flat.     Palpations: Abdomen is soft.  Musculoskeletal:     Comments: - 3cm x3cm nodule on R thumb  R shoulder unable to abduct past 75*, pain with Michel Bickers sign. No edema, erythema, warmth, not tender to palpation.  Skin:    General: Skin is warm and dry.  Neurological:     General: No focal deficit present.     Mental Status: She is alert. Mental status is at baseline.  Psychiatric:        Mood and Affect: Mood normal.        Behavior: Behavior normal.    ASSESSMENT/PLAN:   Type 2 diabetes mellitus without complications  (HCC) Current Regimen: metformin 500 mg qd CBGs: unknown Last A1c: 5.7% today Denies polyuria, polydipsia, hypoglycemia  Last Eye Exam: within the last year Statin: Rosuvastatin 10 mg qd ACE/ARB: lisinopril 20 mg qd  Follow up 6 months    Abnormal mammogram of left breast Patient had mammogram on 9/7 to follow up calcifications in L breast from 3/21. Diagnostic mammogram read confirms benign calcifications in L breast. They recommend 1 year follow up. No mammographic evidence of malignancy in the right breast.  Nodule of skin of right hand Referral placed to [facility] for hand surgeon, Dr. Amedeo Plenty. Patient has not received call. Gave patient contact information to make appointment today. Hand nodule stable.  Shoulder impingement syndrome, right Patient with known rotator cuff syndrome. She requested gabapentin to see if it helped with pain, but she reports that it only offered her minimal relief. Patient has tried injections as well, which did not help. She has not discussed possible surgery for repair, but has had significant quality of life disturbance via sleep interruption and pain. She has tried ibuprofen, voltaren gel, tylenol, and other conservative measures with very minimal relief. Precepted with Dr. Nori Riis who recommends trial  of low dose tramadol at night to help patient get sleep along with referral to Dr. Marchia Bond at [facility] for consultation on rotator cuff repair. - Tramadol 50 mg qhs prn for pain. Counseled patient not to drive or drink EtOH with this medication, may use 1/2 pill to start - Salonpas patch sample given      Gladys Damme, MD [place]

## 2020-01-05 NOTE — Assessment & Plan Note (Signed)
Referral placed to [facility] for hand surgeon, Dr. Amedeo Plenty. Patient has not received call. Gave patient contact information to make appointment today. Hand nodule stable.

## 2020-01-05 NOTE — Patient Instructions (Signed)
It was a pleasure to see you today!  1. For your right shoulder pain: I recommend you try taking Tramadol 50 mg once at night. You can even try a half dose of this to see if this helps relieve your pain at night. I recommend you do not drink alcohol or operate a vehicle with this medication. Call me to let me know if this helps.  Also I recommend you speak to an orthopedic surgeon who specializes in rotator cuff injuries as they are best able to advise you about therapies, including surgery, that may help improve this problem. I have placed a referral to Dr. Marchia Bond at [facility]. If you have not heard from them in 2 weeks, please call our office [phone] and we can help facilitate that referral.  2. For the nodule on your hand, a referral has been made to Dr. Amedeo Plenty, a hand specialist at [facility]. I recommend you call [phone] to make an appointment. If any problems, please contact our office [phone] and we can assist you.  3. Good work with your diabetes! Your A1c is very low in pre-diabetic range at 5.7%   4. Your blood pressure was beautiful, keep up the good work :)  5. Please follow up with me as needed and once yearly for an annual physical  Dr. Chauncey Reading

## 2020-01-13 ENCOUNTER — Telehealth: Payer: Self-pay

## 2020-01-13 NOTE — Telephone Encounter (Signed)
Fax from [facility] patient requesting Rx for HYDROCHLORTHIAZ  Tab that is not on current med list. If you want patient to start please put dose, frequency and whatever else is needed for state approval, and send to pharmacy.

## 2020-01-18 NOTE — Telephone Encounter (Signed)
Hydrochlorthiazide discontinued. Not appropriate for refill. Current regimen with good BP control, no change.  Gladys Damme, MD [place] [facility], PGY-2

## 2020-01-20 DIAGNOSIS — M25511 Pain in right shoulder: Secondary | ICD-10-CM | POA: Diagnosis not present

## 2020-01-28 NOTE — Telephone Encounter (Signed)
Patient calls nurse line regarding refill of HCTZ. Patient reports that she has been on medication since after her stroke. Patient states that she was not informed that medication was being discontinued. Patient is requesting returned phone call from provider to discuss refill and why medication was discontinued.   To PCP  Talbot Grumbling, RN

## 2020-02-03 NOTE — Telephone Encounter (Signed)
HCTZ was discontinued back in May when patient had her episode of dizziness.  Dizziness resolved after HCTZ was discontinued.  Her blood pressure has been controlled on a regimen without HCTZ since then including amlodipine, lisinopril.  Can potentially consider restarting HCTZ if blood pressure not well controlled, however so far blood pressure has been well controlled, last BP 138/82, goal at age is <150/90. After discussing this with patient, she is in agreement with management plan.  Gladys Damme, MD [place] [facility], PGY-2

## 2020-02-04 DIAGNOSIS — M25511 Pain in right shoulder: Secondary | ICD-10-CM | POA: Diagnosis not present

## 2020-02-10 DIAGNOSIS — M25511 Pain in right shoulder: Secondary | ICD-10-CM | POA: Diagnosis not present

## 2020-02-10 DIAGNOSIS — M19011 Primary osteoarthritis, right shoulder: Secondary | ICD-10-CM | POA: Diagnosis not present

## 2020-03-09 DIAGNOSIS — M542 Cervicalgia: Secondary | ICD-10-CM | POA: Diagnosis not present

## 2020-03-14 ENCOUNTER — Other Ambulatory Visit: Payer: Self-pay | Admitting: Family Medicine

## 2020-03-23 ENCOUNTER — Other Ambulatory Visit: Payer: Self-pay | Admitting: Family Medicine

## 2020-03-28 ENCOUNTER — Other Ambulatory Visit: Payer: Self-pay

## 2020-03-28 MED ORDER — LISINOPRIL 20 MG PO TABS
20.0000 mg | ORAL_TABLET | Freq: Every day | ORAL | 3 refills | Status: DC
Start: 2020-03-28 — End: 2021-03-08

## 2020-05-16 ENCOUNTER — Other Ambulatory Visit: Payer: Self-pay

## 2020-05-16 DIAGNOSIS — J3089 Other allergic rhinitis: Secondary | ICD-10-CM

## 2020-05-16 MED ORDER — CETIRIZINE HCL 10 MG PO TABS
10.0000 mg | ORAL_TABLET | Freq: Every day | ORAL | 3 refills | Status: DC
Start: 2020-05-16 — End: 2020-05-17

## 2020-05-17 MED ORDER — CETIRIZINE HCL 10 MG PO TABS: 10.0000 mg | ORAL_TABLET | Freq: Every day | ORAL | 3 refills | Status: DC

## 2020-05-17 NOTE — Telephone Encounter (Signed)
Patient calls nurse line regarding cetirizine rx. Patient reports that [facility] does not supply this rx and needs it to be resent to [facility].   Called and canceled rx at [facility]. Resending to [facility] pharmacy.   Talbot Grumbling, RN

## 2020-05-17 NOTE — Addendum Note (Signed)
Addended by: Talbot Grumbling on: 05/17/2020 04:38 PM   Modules accepted: Orders

## 2020-05-23 DIAGNOSIS — H524 Presbyopia: Secondary | ICD-10-CM | POA: Diagnosis not present

## 2020-05-23 DIAGNOSIS — H25013 Cortical age-related cataract, bilateral: Secondary | ICD-10-CM | POA: Diagnosis not present

## 2020-05-23 DIAGNOSIS — H3411 Central retinal artery occlusion, right eye: Secondary | ICD-10-CM | POA: Diagnosis not present

## 2020-05-23 DIAGNOSIS — H2513 Age-related nuclear cataract, bilateral: Secondary | ICD-10-CM | POA: Diagnosis not present

## 2020-05-23 DIAGNOSIS — H2512 Age-related nuclear cataract, left eye: Secondary | ICD-10-CM | POA: Diagnosis not present

## 2020-05-23 DIAGNOSIS — H40013 Open angle with borderline findings, low risk, bilateral: Secondary | ICD-10-CM | POA: Diagnosis not present

## 2020-05-23 LAB — HM DIABETES EYE EXAM

## 2020-05-24 ENCOUNTER — Other Ambulatory Visit: Payer: Self-pay | Admitting: Family Medicine

## 2020-05-25 ENCOUNTER — Other Ambulatory Visit: Payer: Self-pay | Admitting: Family Medicine

## 2020-06-02 ENCOUNTER — Other Ambulatory Visit: Payer: Self-pay | Admitting: Family Medicine

## 2020-07-24 ENCOUNTER — Other Ambulatory Visit: Payer: Self-pay | Admitting: Family Medicine

## 2020-07-24 NOTE — Telephone Encounter (Signed)
Routing to PCP. [facility] does not do prescriptions for this group.
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
## 2020-08-08 ENCOUNTER — Ambulatory Visit: Payer: Medicare Other | Admitting: Family Medicine

## 2020-09-06 ENCOUNTER — Ambulatory Visit (INDEPENDENT_AMBULATORY_CARE_PROVIDER_SITE_OTHER): Payer: Medicare Other | Admitting: Family Medicine

## 2020-09-06 ENCOUNTER — Other Ambulatory Visit: Payer: Self-pay

## 2020-09-06 DIAGNOSIS — Z5329 Procedure and treatment not carried out because of patient's decision for other reasons: Secondary | ICD-10-CM

## 2020-09-06 NOTE — Progress Notes (Signed)
Patient arrived late and had to reschedule, was not seen 09/06/20.  Gladys Damme, MD [facility], PGY-2

## 2020-09-30 ENCOUNTER — Encounter: Payer: Self-pay | Admitting: Family Medicine

## 2020-09-30 ENCOUNTER — Ambulatory Visit (INDEPENDENT_AMBULATORY_CARE_PROVIDER_SITE_OTHER): Payer: Medicare Other | Admitting: Family Medicine

## 2020-09-30 ENCOUNTER — Ambulatory Visit (INDEPENDENT_AMBULATORY_CARE_PROVIDER_SITE_OTHER): Payer: Medicare Other

## 2020-09-30 ENCOUNTER — Other Ambulatory Visit: Payer: Self-pay

## 2020-09-30 VITALS — BP 154/104 | HR 88 | Ht 63.0 in | Wt 188.2 lb

## 2020-09-30 DIAGNOSIS — Z23 Encounter for immunization: Secondary | ICD-10-CM | POA: Diagnosis not present

## 2020-09-30 DIAGNOSIS — J3089 Other allergic rhinitis: Secondary | ICD-10-CM

## 2020-09-30 DIAGNOSIS — Z0000 Encounter for general adult medical examination without abnormal findings: Secondary | ICD-10-CM

## 2020-09-30 DIAGNOSIS — Z289 Immunization not carried out for unspecified reason: Secondary | ICD-10-CM | POA: Diagnosis not present

## 2020-09-30 DIAGNOSIS — I152 Hypertension secondary to endocrine disorders: Secondary | ICD-10-CM

## 2020-09-30 DIAGNOSIS — M7541 Impingement syndrome of right shoulder: Secondary | ICD-10-CM

## 2020-09-30 DIAGNOSIS — R928 Other abnormal and inconclusive findings on diagnostic imaging of breast: Secondary | ICD-10-CM

## 2020-09-30 DIAGNOSIS — E1159 Type 2 diabetes mellitus with other circulatory complications: Secondary | ICD-10-CM

## 2020-09-30 DIAGNOSIS — E876 Hypokalemia: Secondary | ICD-10-CM | POA: Diagnosis not present

## 2020-09-30 DIAGNOSIS — I10 Essential (primary) hypertension: Secondary | ICD-10-CM | POA: Diagnosis not present

## 2020-09-30 DIAGNOSIS — E119 Type 2 diabetes mellitus without complications: Secondary | ICD-10-CM | POA: Diagnosis not present

## 2020-09-30 LAB — POCT GLYCOSYLATED HEMOGLOBIN (HGB A1C): HbA1c, POC (controlled diabetic range): 6 % (ref 0.0–7.0)

## 2020-09-30 MED ORDER — FLUTICASONE PROPIONATE 50 MCG/ACT NA SUSP: NASAL | 6 refills | Status: DC

## 2020-09-30 MED ORDER — METFORMIN HCL 500 MG PO TABS: 500.0000 mg | ORAL_TABLET | Freq: Every day | ORAL | 3 refills | Status: DC

## 2020-09-30 MED ORDER — GABAPENTIN 300 MG PO CAPS: 300.0000 mg | ORAL_CAPSULE | Freq: Two times a day (BID) | ORAL | 1 refills | Status: DC

## 2020-09-30 MED ORDER — AMLODIPINE BESYLATE 10 MG PO TABS: 10.0000 mg | ORAL_TABLET | Freq: Every day | ORAL | 3 refills | Status: DC

## 2020-09-30 MED ORDER — SHINGRIX 50 MCG/0.5ML IM SUSR: 0.5000 mL | Freq: Once | INTRAMUSCULAR | 0 refills | Status: AC

## 2020-09-30 MED ORDER — CETIRIZINE HCL 10 MG PO TABS: 10.0000 mg | ORAL_TABLET | Freq: Every day | ORAL | 3 refills | Status: DC

## 2020-09-30 NOTE — Assessment & Plan Note (Signed)
COVID-19 immunizations: Pfizer 06/03/19, 06/20/19, November 2021. 4th dose given today. Shingrix: prescription given to patient to complete at her convenience. Repeat diagnostic mammogram 12/2020. Given Dr. Ulyses Amor contact information to schedule last colonoscopy (2014 with polypectomy, repeat 5-10 years).

## 2020-09-30 NOTE — Assessment & Plan Note (Signed)
Repeat diagnostic mammogram in 12/2020

## 2020-09-30 NOTE — Assessment & Plan Note (Signed)
Chronic, well controlled. Current Regimen: Metformin 500 mg daily CBGs: Does not do Last A1c: 6% on today Denies polyuria, polydipsia, hypoglycemia  Last Eye Exam: Within the last year Statin: Crestor 10 mg ACE/ARB: Lisinopril 10 mg

## 2020-09-30 NOTE — Progress Notes (Signed)
SUBJECTIVE:   CHIEF COMPLAINT / HPI:   DM2: A1c obtained today. Patient currently taking metformin 500 mg daily. Does/not check BG at home. Foot exam performed today.  Healthcare maintenance: Patient with 1 dose of zoster vaccine in 2014, she is received 3 Pfizer vaccines for COVID.  Last 1 given in November 2021.  Patient is interested in fourth dose.  She is up-to-date with Tdap and pneumonia vaccines. - Abnormal mammogram: last September patient had a diagnostic mammogram that did not show malignancy, but likely calcifications at lumpectomy site in L breast. Radiology recommended repeat diagnostic mammogram September 2022. -Patient's last colonoscopy was in 2014; she had polypectomy.  She is a patient of Dr. Ulyses Amor.  Repeat recommended in 5 to 10 years.  Discussed with patient.  HTN: BP elevated today 154/104.  She is taking amlodipine 5 mg daily, lisinopril 10 mg daily.  Denies any symptoms.  Right shoulder pain: Patient has history of rotator cuff syndrome and right shoulder.  She reports aching pain especially at night.  She has previously been seen at [facility] in the last year for this problem, CSI injections in September 2021.  She reports that gabapentin significantly helps the aching pain at night would like to try this again since this is the most significant problem for her is sleep interruption.  She is mostly functional although she does have range of motion limitation.  PERTINENT  PMH / PSH: DM, HTN, right rotator cuff syndrome  OBJECTIVE:   BP (!) 154/104   Pulse 88   Ht 5\' 3"  (1.6 m)   Wt 188 lb 3.2 oz (85.4 kg)   SpO2 99%   BMI 33.34 kg/m   Nursing note and vitals reviewed GEN: Age-appropriate AAW, resting comfortably in chair, NAD, mild obesity HEENT: NCAT.Sclera without injection or icterus. MMM.  Cardiac: Regular rate and rhythm. Normal S1/S2. No murmurs, rubs, or gallops appreciated. 2+ radial pulses. Lungs: Clear bilaterally to ascultation.  No increased WOB, no accessory muscle usage. No w/r/r. R shoulder: No evidence of bony deformity, asymmetry, or muscle atrophy; No tenderness over long head of biceps (bicipital groove). No TTP at [facility] joint. Limited active and passive range of motion due to pain, abduction 120*. Strength 5/5 throughout. No abnormal scapular function observed. Sensation intact. Peripheral pulses intact. Positive Hawkins and empty can tests. Ext: no edema Psych: Pleasant and appropriate   ASSESSMENT/PLAN:   Healthcare maintenance COVID-19 immunizations: [place] 06/03/19, 06/20/19, November 2021. 4th dose given today. Shingrix: prescription given to patient to complete at her convenience. Repeat diagnostic mammogram 12/2020. Given Dr. Ulyses Amor contact information to schedule last colonoscopy (2014 with polypectomy, repeat 5-10 years).  Abnormal mammogram of left breast Repeat diagnostic mammogram in 12/2020  Type 2 diabetes mellitus without complications (HCC) Chronic, well controlled. Current Regimen: Metformin 500 mg daily CBGs: Does not do Last A1c: 6% on today Denies polyuria, polydipsia, hypoglycemia  Last Eye Exam: Within the last year Statin: Crestor 10 mg ACE/ARB: Lisinopril 10 mg    Hypertension associated with diabetes ([place]) Hypertension: - Medications: Amlodipine 5 mg, lisinopril 10 mg - Compliance: Good - Checking BP at home: No, has wrist cuff - Denies any SOB, CP, vision changes, LE edema, medication SEs, or symptoms of hypotension - Diet: SAD - Exercise: Minimal  Hypertension is chronic, not particularly well controlled today.  Patient reports that she takes her medication but has not taken it this morning.  Recommend increasing amlodipine to 10 mg, follow-up in 1 month.  Currently asymptomatic.  Will obtain CMP today.     Gladys Damme, MD [place]

## 2020-09-30 NOTE — Assessment & Plan Note (Signed)
Hypertension: - Medications: Amlodipine 5 mg, lisinopril 10 mg - Compliance: Good - Checking BP at home: No, has wrist cuff - Denies any SOB, CP, vision changes, LE edema, medication SEs, or symptoms of hypotension - Diet: SAD - Exercise: Minimal  Hypertension is chronic, not particularly well controlled today.  Patient reports that she takes her medication but has not taken it this morning.  Recommend increasing amlodipine to 10 mg, follow-up in 1 month.  Currently asymptomatic.  Will obtain CMP today.

## 2020-09-30 NOTE — Patient Instructions (Addendum)
It was a pleasure to see you today!  For your high blood pressure: let's increase increase your amlodipine to 10 mg daily. Follow up in 1 month. Also please take your blood pressure at the same time every day for 1 week and bring those numbers to your follow up appointment. For your shoulder pain: try taking gabapentin 300 mg BID for shoulder pain. Since this helped in the past, let's start here and follow up in 1 month. Please contact Dr. [facility] for your last colonoscopy: Address: [address], [place], [place] [address], Phone: [phone] In September you will need a repeat diagnostic mammogram 6.  We will get some labs today.  If they are abnormal or we need to do something about them, I will call you.  If they are normal, I will send you a message on MyChart (if it is active) or a letter in the mail.  If you don't hear from [place] in 2 weeks, please call the office  [phone].   Be Well,  Dr. Chauncey Reading

## 2020-10-01 LAB — COMPREHENSIVE METABOLIC PANEL
ALT: 8 IU/L (ref 0–32)
AST: 19 IU/L (ref 0–40)
Albumin/Globulin Ratio: 1.6 (ref 1.2–2.2)
Albumin: 4.3 g/dL (ref 3.7–4.7)
Alkaline Phosphatase: 112 IU/L (ref 44–121)
BUN/Creatinine Ratio: 14 (ref 12–28)
BUN: 11 mg/dL (ref 8–27)
Bilirubin Total: 0.2 mg/dL (ref 0.0–1.2)
CO2: 25 mmol/L (ref 20–29)
Calcium: 10.1 mg/dL (ref 8.7–10.3)
Chloride: 102 mmol/L (ref 96–106)
Creatinine, Ser: 0.76 mg/dL (ref 0.57–1.00)
Globulin, Total: 2.7 g/dL (ref 1.5–4.5)
Glucose: 81 mg/dL (ref 65–99)
Potassium: 3.7 mmol/L (ref 3.5–5.2)
Sodium: 143 mmol/L (ref 134–144)
Total Protein: 7 g/dL (ref 6.0–8.5)
eGFR: 84 mL/min/{1.73_m2} (ref >59)

## 2020-10-17 DIAGNOSIS — H2513 Age-related nuclear cataract, bilateral: Secondary | ICD-10-CM | POA: Diagnosis not present

## 2020-10-17 DIAGNOSIS — H47291 Other optic atrophy, right eye: Secondary | ICD-10-CM | POA: Diagnosis not present

## 2020-10-17 DIAGNOSIS — H524 Presbyopia: Secondary | ICD-10-CM | POA: Diagnosis not present

## 2020-10-17 DIAGNOSIS — H5202 Hypermetropia, left eye: Secondary | ICD-10-CM | POA: Diagnosis not present

## 2020-11-24 ENCOUNTER — Other Ambulatory Visit: Payer: Self-pay | Admitting: Family Medicine

## 2020-11-24 DIAGNOSIS — R921 Mammographic calcification found on diagnostic imaging of breast: Secondary | ICD-10-CM

## 2020-12-06 ENCOUNTER — Other Ambulatory Visit: Payer: Self-pay | Admitting: Family Medicine

## 2020-12-06 DIAGNOSIS — M7541 Impingement syndrome of right shoulder: Secondary | ICD-10-CM

## 2020-12-13 ENCOUNTER — Telehealth: Payer: Self-pay

## 2020-12-13 NOTE — Telephone Encounter (Signed)
LVM to have pt call back to schedule AWV.   RE: confirm insurance and schedule AWV on my schedule if times are convenient for patient or other AWV schedule as template permits.   

## 2020-12-22 ENCOUNTER — Other Ambulatory Visit: Payer: Self-pay | Admitting: Family Medicine

## 2020-12-29 ENCOUNTER — Other Ambulatory Visit: Payer: Self-pay

## 2020-12-29 ENCOUNTER — Ambulatory Visit
Admission: RE | Admit: 2020-12-29 | Discharge: 2020-12-29 | Disposition: A | Discharge status: Home / Self Care | Payer: Medicare Other | Source: Ambulatory Visit | Attending: Family Medicine | Admitting: Family Medicine

## 2020-12-29 DIAGNOSIS — Z853 Personal history of malignant neoplasm of breast: Secondary | ICD-10-CM | POA: Diagnosis not present

## 2020-12-29 DIAGNOSIS — R921 Mammographic calcification found on diagnostic imaging of breast: Secondary | ICD-10-CM | POA: Diagnosis not present

## 2020-12-29 DIAGNOSIS — R922 Inconclusive mammogram: Secondary | ICD-10-CM | POA: Diagnosis not present

## 2021-03-07 ENCOUNTER — Other Ambulatory Visit: Payer: Self-pay | Admitting: Family Medicine

## 2021-03-28 ENCOUNTER — Other Ambulatory Visit: Payer: Self-pay

## 2021-03-28 ENCOUNTER — Ambulatory Visit (INDEPENDENT_AMBULATORY_CARE_PROVIDER_SITE_OTHER): Payer: Medicare Other | Admitting: Family Medicine

## 2021-03-28 VITALS — BP 135/80 | HR 82 | Temp 97.4°F | Ht 63.0 in | Wt 189.6 lb

## 2021-03-28 DIAGNOSIS — J069 Acute upper respiratory infection, unspecified: Secondary | ICD-10-CM

## 2021-03-28 MED ORDER — BENZONATATE 200 MG PO CAPS: 200.0000 mg | ORAL_CAPSULE | Freq: Two times a day (BID) | ORAL | 0 refills | Status: AC | PRN

## 2021-03-28 MED ORDER — FLUTICASONE PROPIONATE 50 MCG/ACT NA SUSP: NASAL | 6 refills | Status: DC

## 2021-03-28 NOTE — Progress Notes (Signed)
    SUBJECTIVE:   CHIEF COMPLAINT / HPI:   [name] is a 70 yo who presents for cough and congestion. Cough started about a week ago. Denies any mucuous. Denies fevers, chills, diarrhea, constipation. Does have runny nos. Denies ear pain. Denies sore throat. Denies nausea, vomitting, body aches, stomach pain. Has been drinking a lot of fluids.Had niece the past weekend who was coughing. Took covid test on Friday which was negative.   Has flu vaccine and COVID booster on 02/24/21   OBJECTIVE:   Pulse 82   Temp (!) 97.4 F (36.3 C)   Ht 5\' 3"  (1.6 m)   Wt 189 lb 9.6 oz (86 kg)   SpO2 95%   BMI 33.59 kg/m    Physical exam General: well appearing, NAD Cardiovascular: RRR, no murmurs Lungs: Diffuse course lung sunds Normal WOB Abdomen: soft, non-distended, non-tender Skin: warm, dry. No edema  ASSESSMENT/PLAN:   No problem-specific Assessment & Plan notes found for this encounter.   Cough and congestion 1 week in duration without fever, body aches, diarrhea, constipation. Does have sick contact with her niece. On presentation vitals stable and afebrile. Lungs course diffusely in all lung fields.  Symptoms likely due to a viral infection. Given no focality in her long sounds and she has remained afebrile low suspicion for pneumonia. With shared decision making opted not to swab. Provided symptomatic management with steam, flonase, Mucinex DM, throat lozenges. Advised to stay hydrated. Prescribed Tessalon Perles for cough. Gave strict return precautions   Wendy Heath

## 2021-03-28 NOTE — Patient Instructions (Addendum)
It was great seeing you today!  Im sorry you havent been feeling well.   This is most likely a viral infection. This will take time to get over. The treatment for this is supportive care. Steam, Vicks vapor rub, a humidifier and nasal saline spray (I refilled your Flonase) can help with congestion. I have prescribed Tessalon Perles for cough. You can also try Mucinex DM OTC as well as throat lozenges. it is important to keep hydrated throughout this time!   Please return if symptoms do not improve after 2 weeks, you start to have fevers, are unable to keep fluids down, or any concerning symptoms to you.    Feel free to call with any questions or concerns at any time, at [phone].   Take care,  Dr. Shary Key [facility]

## 2021-05-02 ENCOUNTER — Other Ambulatory Visit: Payer: Self-pay | Admitting: Family Medicine

## 2021-05-04 ENCOUNTER — Other Ambulatory Visit: Payer: Self-pay | Admitting: Family Medicine

## 2021-06-03 ENCOUNTER — Other Ambulatory Visit: Payer: Self-pay | Admitting: Family Medicine

## 2021-06-03 DIAGNOSIS — J3089 Other allergic rhinitis: Secondary | ICD-10-CM

## 2021-08-25 ENCOUNTER — Other Ambulatory Visit: Payer: Self-pay | Admitting: Family Medicine

## 2021-09-04 ENCOUNTER — Ambulatory Visit: Payer: Medicare Other

## 2021-09-05 ENCOUNTER — Other Ambulatory Visit: Payer: Self-pay | Admitting: Family Medicine

## 2021-09-05 DIAGNOSIS — I10 Essential (primary) hypertension: Secondary | ICD-10-CM

## 2021-09-25 ENCOUNTER — Encounter: Payer: Self-pay | Admitting: Family Medicine

## 2021-09-25 ENCOUNTER — Ambulatory Visit (INDEPENDENT_AMBULATORY_CARE_PROVIDER_SITE_OTHER): Payer: Medicare Other | Admitting: Family Medicine

## 2021-09-25 VITALS — BP 126/83 | HR 95 | Ht 63.0 in | Wt 190.0 lb

## 2021-09-25 DIAGNOSIS — I152 Hypertension secondary to endocrine disorders: Secondary | ICD-10-CM | POA: Diagnosis not present

## 2021-09-25 DIAGNOSIS — E1169 Type 2 diabetes mellitus with other specified complication: Secondary | ICD-10-CM

## 2021-09-25 DIAGNOSIS — E1159 Type 2 diabetes mellitus with other circulatory complications: Secondary | ICD-10-CM

## 2021-09-25 DIAGNOSIS — E785 Hyperlipidemia, unspecified: Secondary | ICD-10-CM

## 2021-09-25 DIAGNOSIS — E119 Type 2 diabetes mellitus without complications: Secondary | ICD-10-CM

## 2021-09-25 DIAGNOSIS — M170 Bilateral primary osteoarthritis of knee: Secondary | ICD-10-CM | POA: Diagnosis not present

## 2021-09-25 LAB — POCT GLYCOSYLATED HEMOGLOBIN (HGB A1C): HbA1c, POC (controlled diabetic range): 5.8 % (ref 0.0–7.0)

## 2021-09-25 MED ORDER — DICLOFENAC SODIUM 1 % EX GEL: 4.0000 g | Freq: Four times a day (QID) | CUTANEOUS | 1 refills | Status: AC

## 2021-09-25 MED ORDER — ACETAMINOPHEN ER 650 MG PO TBCR: 1300.0000 mg | EXTENDED_RELEASE_TABLET | Freq: Three times a day (TID) | ORAL | 0 refills | Status: AC

## 2021-09-25 NOTE — Patient Instructions (Addendum)
Thank you for coming to see me today. It was a pleasure. Today we talked about:   A1c 5.8 We will get some labs today.  If they are abnormal or we need to do something about them, I will call you.  If they are normal, I will send you a message on MyChart (if it is active) or a letter in the mail.  If you don't hear from [place] in 2 weeks, please call the office at the number below.   Use Tylenol 1300 mg three times a day for 2 weeks.  Then 650 mg thee times a day as needed Use Diclofenac gel 4gm three times a day for 2 weeks then as needed  Will send referral to Orthopedics for evaluation for knee surgery  You are due for an eye exam.  Please make sure that you call your eye doctor to have this scheduled and have them fax the results to our office.   Recommend Shingles vaccine.  This is a 2 dose series and can be given at your local pharmacy.  Please talk to your pharmacist about this.   Please follow-up with PCP in 1-2 weeks  If you have any questions or concerns, please do not hesitate to call the office at [phone].  Best,   Carollee Leitz, MD

## 2021-09-25 NOTE — Progress Notes (Unsigned)
    SUBJECTIVE:   CHIEF COMPLAINT / HPI: referral to ortho for knee pain  Patient reports bilateral knee pain progressively worsening.  Was seen by Orthopedics few years ago and repots had arthritis.  Has had knee injections in the past that minimally helped.  Endorses having more difficulty walking and standing secondary to pain.  Pain is constant and has been able to find relief with mediations.  PERTINENT  PMH / PSH:  Bilateral Knee osteoarthritis DM Type 2 HTN H/O Falls  OBJECTIVE:   BP 126/83   Pulse 95   Ht '5\' 3"'$  (1.6 m)   Wt 190 lb (86.2 kg)   SpO2 98%   BMI 33.66 kg/m    General: Alert, no acute distress Cardio: Normal S1 and S2, RRR, no r/m/g Pulm: CTAB, normal work of breathing Bilateral Knee Exam. No redness or swelling noted.  Limited passive/active range of motion of bilateral knees associated with pain and significant crepitus.  Tenderness at medial joint line bilaterally. Unable to appreciate any joint effusion.  Anterior/posterior drawer tests, McMurray's, Lachmann's testing all negative.     ASSESSMENT/PLAN:   OA (osteoarthritis) of knee Worsening pain bilateral knees with significant crepitus on exam.  Recent xray from 2020 showing worsening joint space narrowing.  -Tylenol 1300 mg TID x 14 days then can decrease to 650 mg TID -Diclofenac gel QID  -Referral to Orthopedics for evaluation of knee replacement.  Was previously seen at [facility] for right shoulder pain.   -Follow up as needed  Hypertension associated with diabetes ([place]) Controlled -BMet today -Continue current medications, may need adjustments pending Cr results -Follow up with PCP in 1-2 weeks  Hyperlipidemia associated with type 2 diabetes mellitus ([place]) Lipid panel today.  Goal LDL <70 Continue current medication  Type 2 diabetes mellitus without complications (HCC) S9Q 5.8 today. Goal <7  Could consider decreasing Metformin to 250 mg daily or discontinuing medication and  continue to manage with diet Eye exam due Diabetic foot exam due Follow up with PCP for further DM management   Recommend [place]ingle vaccination   Carollee Leitz, MD [place]

## 2021-09-26 ENCOUNTER — Encounter: Payer: Self-pay | Admitting: *Deleted

## 2021-09-26 ENCOUNTER — Encounter: Payer: Self-pay | Admitting: Family Medicine

## 2021-09-26 DIAGNOSIS — M179 Osteoarthritis of knee, unspecified: Secondary | ICD-10-CM | POA: Insufficient documentation

## 2021-09-26 LAB — LIPID PANEL
Chol/HDL Ratio: 2.6 ratio (ref 0.0–4.4)
Cholesterol, Total: 118 mg/dL (ref 100–199)
HDL: 45 mg/dL (ref >39)
LDL Chol Calc (NIH): 54 mg/dL (ref 0–99)
Triglycerides: 98 mg/dL (ref 0–149)
VLDL Cholesterol Cal: 19 mg/dL (ref 5–40)

## 2021-09-26 LAB — BASIC METABOLIC PANEL
BUN/Creatinine Ratio: 14 (ref 12–28)
BUN: 15 mg/dL (ref 8–27)
CO2: 27 mmol/L (ref 20–29)
Calcium: 9.4 mg/dL (ref 8.7–10.3)
Chloride: 105 mmol/L (ref 96–106)
Creatinine, Ser: 1.04 mg/dL — ABNORMAL HIGH (ref 0.57–1.00)
Glucose: 102 mg/dL — ABNORMAL HIGH (ref 70–99)
Potassium: 4.2 mmol/L (ref 3.5–5.2)
Sodium: 145 mmol/L — ABNORMAL HIGH (ref 134–144)
eGFR: 57 mL/min/{1.73_m2} — ABNORMAL LOW (ref >59)

## 2021-09-26 NOTE — Assessment & Plan Note (Addendum)
A1c 5.8 today. Goal <7  Could consider decreasing Metformin to 250 mg daily or discontinuing medication and continue to manage with diet Eye exam due Diabetic foot exam due Follow up with PCP for further DM management

## 2021-09-26 NOTE — Assessment & Plan Note (Signed)
Controlled -BMet today -Continue current medications, may need adjustments pending Cr results -Follow up with PCP in 1-2 weeks

## 2021-09-26 NOTE — Assessment & Plan Note (Signed)
Lipid panel today.  Goal LDL <70 Continue current medication

## 2021-09-26 NOTE — Assessment & Plan Note (Signed)
Worsening pain bilateral knees with significant crepitus on exam.  Recent xray from 2020 showing worsening joint space narrowing.  -Tylenol 1300 mg TID x 14 days then can decrease to 650 mg TID -Diclofenac gel QID  -Referral to Orthopedics for evaluation of knee replacement.  Was previously seen at [facility] for right shoulder pain.   -Follow up as needed

## 2021-10-03 ENCOUNTER — Ambulatory Visit: Payer: Medicare Other | Admitting: Physician Assistant

## 2021-10-05 ENCOUNTER — Encounter: Payer: Self-pay | Admitting: Family Medicine

## 2021-10-05 ENCOUNTER — Ambulatory Visit (INDEPENDENT_AMBULATORY_CARE_PROVIDER_SITE_OTHER): Payer: Medicare Other | Admitting: Family Medicine

## 2021-10-05 VITALS — BP 128/82 | HR 85 | Wt 190.0 lb

## 2021-10-05 DIAGNOSIS — I152 Hypertension secondary to endocrine disorders: Secondary | ICD-10-CM

## 2021-10-05 DIAGNOSIS — E1159 Type 2 diabetes mellitus with other circulatory complications: Secondary | ICD-10-CM

## 2021-10-05 DIAGNOSIS — M7541 Impingement syndrome of right shoulder: Secondary | ICD-10-CM

## 2021-10-05 DIAGNOSIS — R7989 Other specified abnormal findings of blood chemistry: Secondary | ICD-10-CM | POA: Diagnosis not present

## 2021-10-05 DIAGNOSIS — E119 Type 2 diabetes mellitus without complications: Secondary | ICD-10-CM

## 2021-10-05 MED ORDER — GABAPENTIN 300 MG PO CAPS: 300.0000 mg | ORAL_CAPSULE | Freq: Two times a day (BID) | ORAL | 11 refills | Status: DC

## 2021-10-05 MED ORDER — LISINOPRIL 10 MG PO TABS: 10.0000 mg | ORAL_TABLET | Freq: Every day | ORAL | 0 refills | Status: DC

## 2021-10-05 NOTE — Patient Instructions (Addendum)
Thank you for coming to see me today. It was a pleasure.   We will get some labs today.  If they are abnormal or we need to do something about them, I will call you.  If they are normal, I will send you a message on MyChart (if it is active) or a letter in the mail.  If you don't hear from [place] in 2 weeks, please call the office at the number below.   For your blood pressure Stop lisinopril 20 mg Start lisinopril 10 mg at night  Monitor your blood pressure at home 2-3 times a week.  If blood pressure is greater than 160/90 please call MD  For your diabetes Your last A1c was 5.8.  It has been at goal for quite some time now. Stop taking Metformin Will recheck your A1c in September  For your kidneys I would like for you to increase your water intake to at least 1 L a day or three 8 ounce glasses.  I will be at the [facility] in [place].   The address is [address] [place] The number is [phone]  If you wish to continue your primary care services with me please call the above number to schedule appointment.  Just tell them that you are currently a patient of Dr. Loistine Chance to schedule an appointment.   Please follow-up with PCP in 2 weeks  If you have any questions or concerns, please do not hesitate to call the office at [phone].  Best,   Carollee Leitz, MD

## 2021-10-05 NOTE — Progress Notes (Signed)
    SUBJECTIVE:   CHIEF COMPLAINT / HPI:   Presents for follow up for elevated creatinine. Seen in clinic on 06/05 for bilateral osteoarthritis of knees.  Bmet obtained at that time showed elevated creatinine 1.04, significantly elevated from baseline.  On lisinopril 20 mg daily and compliant with medication.  Denies any visual changes, headaches, chest pain, shortness of breath, decrease in urine output or lower extremity edema.  Diabetes type 2 Asymptomatic.  Compliant with metformin 500 mg daily.  PERTINENT  PMH / PSH:  Hypertension Diabetes type 2 Hypercholesterolemia  OBJECTIVE:   BP 128/82   Pulse 85   Wt 190 lb (86.2 kg)   SpO2 95%   BMI 33.66 kg/m    General: Alert, no acute distress Cardio: Normal S1 and S2, RRR, no r/m/g Pulm: CTAB, normal work of breathing Abdomen: Bowel sounds normal. Abdomen soft and non-tender.  Extremities: No peripheral edema.  Neuro: Cranial nerves grossly intact   ASSESSMENT/PLAN:   Elevated serum creatinine Recent creatinine 1.04, baseline 0.7.  Currently on lisinopril 20 mg daily.  Blood pressure well controlled. -Repeat BMet today -Decrease lisinopril to 10 mg daily -Follow-up with PCP in 2 weeks -Strict return precautions provided.  Hypertension associated with diabetes ([place]) At goal.  Given elevated creatinine will decrease lisinopril to 10 mg daily -Bmet today. -Follow-up with PCP in 2 weeks. -Strict return precautions provided.  Type 2 diabetes mellitus without complications (HCC) Asymptomatic.  Most recent A1c 5..  Per chart review A1c has been stable for the past few years of less than 6. -Discontinue metformin today -Repeat A1c in 3 months -Follow-up with PCP in Sept -Strict return precautions provided  Impingement syndrome of shoulder, right Refill gabapentin 300 mg twice daily     Carollee Leitz, MD [place]

## 2021-10-06 LAB — BASIC METABOLIC PANEL
BUN/Creatinine Ratio: 14 (ref 12–28)
BUN: 11 mg/dL (ref 8–27)
CO2: 25 mmol/L (ref 20–29)
Calcium: 10 mg/dL (ref 8.7–10.3)
Chloride: 104 mmol/L (ref 96–106)
Creatinine, Ser: 0.79 mg/dL (ref 0.57–1.00)
Glucose: 99 mg/dL (ref 70–99)
Potassium: 4.4 mmol/L (ref 3.5–5.2)
Sodium: 142 mmol/L (ref 134–144)
eGFR: 79 mL/min/{1.73_m2} (ref >59)

## 2021-10-08 ENCOUNTER — Encounter: Payer: Self-pay | Admitting: Family Medicine

## 2021-10-08 DIAGNOSIS — R7989 Other specified abnormal findings of blood chemistry: Secondary | ICD-10-CM | POA: Insufficient documentation

## 2021-10-08 NOTE — Assessment & Plan Note (Signed)
At goal.  Given elevated creatinine will decrease lisinopril to 10 mg daily -Bmet today. -Follow-up with PCP in 2 weeks. -Strict return precautions provided.

## 2021-10-08 NOTE — Assessment & Plan Note (Signed)
Refill gabapentin 300 mg twice daily

## 2021-10-08 NOTE — Assessment & Plan Note (Signed)
Asymptomatic.  Most recent A1c 5..  Per chart review A1c has been stable for the past few years of less than 6. -Discontinue metformin today -Repeat A1c in 3 months -Follow-up with PCP in Sept -Strict return precautions provided

## 2021-10-08 NOTE — Assessment & Plan Note (Signed)
Recent creatinine 1.04, baseline 0.7.  Currently on lisinopril 20 mg daily.  Blood pressure well controlled. -Repeat BMet today -Decrease lisinopril to 10 mg daily -Follow-up with PCP in 2 weeks -Strict return precautions provided.

## 2021-10-09 ENCOUNTER — Encounter: Payer: Self-pay | Admitting: Family Medicine

## 2021-10-09 ENCOUNTER — Other Ambulatory Visit: Payer: Self-pay | Admitting: Family Medicine

## 2021-10-09 DIAGNOSIS — E119 Type 2 diabetes mellitus without complications: Secondary | ICD-10-CM

## 2021-10-12 ENCOUNTER — Ambulatory Visit: Payer: Self-pay

## 2021-10-12 ENCOUNTER — Ambulatory Visit (INDEPENDENT_AMBULATORY_CARE_PROVIDER_SITE_OTHER): Payer: Medicare Other | Admitting: Orthopaedic Surgery

## 2021-10-12 ENCOUNTER — Encounter: Payer: Self-pay | Admitting: Orthopaedic Surgery

## 2021-10-12 ENCOUNTER — Ambulatory Visit (INDEPENDENT_AMBULATORY_CARE_PROVIDER_SITE_OTHER): Payer: Medicare Other

## 2021-10-12 VITALS — Ht 62.0 in | Wt 190.0 lb

## 2021-10-12 DIAGNOSIS — M1712 Unilateral primary osteoarthritis, left knee: Secondary | ICD-10-CM

## 2021-10-12 DIAGNOSIS — M1711 Unilateral primary osteoarthritis, right knee: Secondary | ICD-10-CM

## 2021-10-12 NOTE — Progress Notes (Signed)
Office Visit Note   Patient: Wendy Heath           Date of Birth: 1951/01/26           MRN: [id number] Visit Date: 10/12/2021              Requested by: Wendy Feil, MD [address] [place],  [place] [address] PCP: Wendy Damme, MD   Assessment & Plan: Visit Diagnoses:  1. Primary osteoarthritis of right knee   2. Primary osteoarthritis of left knee     Plan: Based on findings the patient has advanced end-stage bilateral knee DJD.  The right knee is more symptomatic.  She has tried to manage the pain through conservative measures for several years now but at this point she is ready for a total knee replacement.  We will do the right 1 first as this is more symptomatic.  We will obtain preoperative medical clearance from Wendy Heath.  Wendy Heath will call the patient to schedule surgery.  Risk benefits prognosis of the surgery reviewed with the patient in detail.  She does have a supportive son who can help her postoperatively and we anticipate that she will be able to go home after surgery after an overnight stay in the hospital.  Denies nickel allergy or previous DVT.  Follow-Up Instructions: No follow-ups on file.   Orders:  Orders Placed This Encounter  Procedures   XR KNEE 3 VIEW LEFT   XR KNEE 3 VIEW RIGHT   No orders of the defined types were placed in this encounter.     Procedures: No procedures performed   Clinical Data: No additional findings.   Subjective: Chief Complaint  Patient presents with   Right Knee - Pain   Left Knee - Pain    HPI Wendy Heath is a very pleasant and active 71 year old female here for bilateral knee pain worse on the right.  She has had worsening pain over the last year.  She has managed this pain for many years with cortisone injections in an Aleve and activity modifications.  Unfortunately these treatments have now failed to provide any significant relief.  She has constant pain with ADLs and night  pain.  Review of Systems  Constitutional: Negative.   HENT: Negative.    Eyes: Negative.   Respiratory: Negative.    Cardiovascular: Negative.   Endocrine: Negative.   Musculoskeletal: Negative.   Neurological: Negative.   Hematological: Negative.   Psychiatric/Behavioral: Negative.    All other systems reviewed and are negative.    Objective: Vital Signs: Ht '5\' 2"'$  (1.575 m)   Wt 190 lb (86.2 kg)   BMI 34.75 kg/m   Physical Exam Vitals and nursing note reviewed.  Constitutional:      Appearance: She is well-developed.  HENT:     Head: Normocephalic and atraumatic.  Pulmonary:     Effort: Pulmonary effort is normal.  Abdominal:     Palpations: Abdomen is soft.  Musculoskeletal:     Cervical back: Neck supple.  Skin:    General: Skin is warm.     Capillary Refill: Capillary refill takes less than 2 seconds.  Neurological:     Mental Status: She is alert and oriented to person, place, and time.  Psychiatric:        Behavior: Behavior normal.        Thought Content: Thought content normal.        Judgment: Judgment normal.     Ortho Exam Examination of  bilateral knees show no joint effusion.  There is pain and significant crepitus throughout arc of motion.  Range of motion is moderately limited secondary to pain and guarding.  Collaterals and cruciates are stable.  Medial and lateral joint line tenderness. Specialty Comments:  No specialty comments available.  Imaging: No results found.   PMFS History: Patient Active Problem List   Diagnosis Date Noted   Primary osteoarthritis of right knee 10/12/2021   Primary osteoarthritis of left knee 10/12/2021   Elevated serum creatinine 10/08/2021   OA (osteoarthritis) of knee 09/26/2021   Nodule of skin of right hand 12/15/2019   Lipoma of right forearm 12/15/2019   Impingement syndrome of shoulder, right 10/24/2019   Sleep difficulties 06/01/2019   Abnormal mammogram of left breast 05/28/2019   Mild cognitive  impairment 05/28/2019   History of fall 05/28/2019   Dizziness 05/16/2018   Headache 03/27/2017   Type 2 diabetes mellitus without complications ([place]) 60/63/0160   Hypokalemia 02/03/2013   Seasonal allergies 05/14/2012   Blind right eye 07/04/2011   OBESITY 12/28/2008   Constipation 12/28/2008   Primary localized osteoarthritis of both knees 12/28/2008   Hyperlipidemia associated with type 2 diabetes mellitus ([place]) 08/24/2006   Hypertension associated with diabetes ([place]) 08/24/2006   GERD 08/24/2006   CEREBROVASCULAR ACCIDENT, HX OF 08/24/2006   DOMESTIC ABUSE, HX OF 08/24/2006   Past Medical History:  Diagnosis Date   Abnormal mammogram of left breast 05/28/2019   12/2018 D[facility] MM: Diagnostic left mammogram is recommended in 6 months to confirm stability of Probably benign calcifications at the lumpectomy site in the left breast without definite appreciable change compared to multiple priors.     Blind right eye 07/04/2011   Since stroke 2003   CEREBROVASCULAR ACCIDENT, HX OF 08/24/2006   Chronic pain of both knees 12/28/2008   Qualifier: Diagnosis of  By: Hassell Done FNP, [place]     Diverticulitis    Dizziness 05/16/2018   GERD 08/24/2006   History of left breast cancer 03/25/2015   HYPERLIPIDEMIA 08/24/2006   Hyperlipidemia associated with type 2 diabetes mellitus ([place]) 08/24/2006   Qualifier: Diagnosis of  By: Radene Ou MD, [place]     HYPERTENSION 08/24/2006   Hypertension associated with diabetes ([place]) 08/24/2006   Qualifier: Diagnosis of  By: Radene Ou MD, [place]     Impaired glucose tolerance 07/04/2011   Malignant neoplasm of breast (female), unspecified site 03/13/2011   OBESITY 12/28/2008   Qualifier: Diagnosis of  By: Hassell Done FNP, [place]     Pain in both thighs 04/01/2018   Personal history of chemotherapy    Personal history of radiation therapy 12/26/2006   Postural hypotension 06/19/2018   Seasonal allergies 05/14/2012   Stroke ([place]) 2005   Type 2 diabetes mellitus without  complications ([place]) 05/01/3233    Family History  Problem Relation Age of Onset   Stroke Other    Cancer Other        breast cancer   Cancer Other        breast cancer   Stroke Other    Hypertension Other    Diabetes Other    Sudden death Other    Breast cancer Mother    Heart disease Father    Breast cancer Sister    Heart disease Brother     Past Surgical History:  Procedure Laterality Date   ABDOMINAL HYSTERECTOMY  1995   BREAST BIOPSY  06/10/2006   malignant   BREAST LUMPECTOMY Left 11/25/2006   malignant   COLONOSCOPY  N/A 02/05/2013   Procedure: COLONOSCOPY;  Surgeon: Beryle Beams, MD;  Location: [facility];  Service: Endoscopy;  Laterality: N/A;   Social History   Occupational History    Employer: UNEMPLOYED  Tobacco Use   Smoking status: Every Day    Packs/day: 0.25    Years: 50.00    Total pack years: 12.50    Types: Cigarettes    Passive exposure: Current   Smokeless tobacco: Never  Vaping Use   Vaping Use: Never used  Substance and Sexual Activity   Alcohol use: Yes    Comment: Once a month.    Drug use: No   Sexual activity: Not Currently

## 2021-10-17 ENCOUNTER — Ambulatory Visit (INDEPENDENT_AMBULATORY_CARE_PROVIDER_SITE_OTHER): Payer: Medicare Other | Admitting: Family Medicine

## 2021-10-17 ENCOUNTER — Encounter: Payer: Self-pay | Admitting: Family Medicine

## 2021-10-17 VITALS — BP 124/80 | HR 84 | Ht 63.0 in | Wt 187.0 lb

## 2021-10-17 DIAGNOSIS — I152 Hypertension secondary to endocrine disorders: Secondary | ICD-10-CM

## 2021-10-17 DIAGNOSIS — E1159 Type 2 diabetes mellitus with other circulatory complications: Secondary | ICD-10-CM | POA: Diagnosis not present

## 2021-10-17 DIAGNOSIS — M170 Bilateral primary osteoarthritis of knee: Secondary | ICD-10-CM | POA: Diagnosis not present

## 2021-10-17 NOTE — Progress Notes (Signed)
    SUBJECTIVE:   CHIEF COMPLAINT / HPI: Blood pressure follow-up  Presents for follow up for blood pressure check. Seen in clinic on 06/15 and lisinopril was decreased to 10 mg daily secondary to elevated creatinine on 06/05.  Repeat creatinine on 06/15 showed creatinine had returned to normal.  Patient reports does not check blood pressure at home.  Denies any headaches, visual changes, chest pain, shortness of breath, abdominal pain or lower extremity edema.   She reports that she was seen by orthopedics and is planning for right knee surgery followed by left knee surgery.  Unknown when this is going to be scheduled.  Did not bring any forms today for surgical clearance and may need to do so in future.  PERTINENT  PMH / PSH:  Hypertension Diabetes type 2 Hypercholesterolemia  OBJECTIVE:   BP 124/80   Pulse 84   Ht '5\' 3"'$  (1.6 m)   Wt 187 lb (84.8 kg)   SpO2 97%   BMI 33.13 kg/m    General: Alert, no acute distress Cardio: Normal S1 and S2, RRR, no r/m/g Pulm: CTAB, normal work of breathing Abdomen: Bowel sounds normal. Abdomen soft and non-tender.  Extremities: No peripheral edema.    ASSESSMENT/PLAN:   Hypertension associated with diabetes ([place]) Blood pressure at goal, creatinine now within normal limits. Continue lisinopril 10 mg daily Follow-up PCP 3 months.  OA (osteoarthritis) of knee Continue Tylenol 650 mg 3 times daily as needed Continue diclofenac gel 4 times daily as needed Follow-up with orthopedics with plan for surgical intervention      Carollee Leitz, MD [place]

## 2021-10-20 ENCOUNTER — Encounter: Payer: Self-pay | Admitting: Family Medicine

## 2021-10-20 NOTE — Assessment & Plan Note (Signed)
Continue Tylenol 650 mg 3 times daily as needed Continue diclofenac gel 4 times daily as needed Follow-up with orthopedics with plan for surgical intervention

## 2021-10-20 NOTE — Assessment & Plan Note (Signed)
Blood pressure at goal, creatinine now within normal limits. Continue lisinopril 10 mg daily Follow-up PCP 3 months.

## 2021-11-05 ENCOUNTER — Other Ambulatory Visit: Payer: Self-pay | Admitting: Family Medicine

## 2021-11-07 ENCOUNTER — Telehealth: Payer: Self-pay | Admitting: Orthopaedic Surgery

## 2021-11-07 NOTE — Telephone Encounter (Signed)
Clearance request sent to Dr. Gladys Damme on 10-26-21 for right total knee arthroplasty.  Patient called wanting Dr. Erlinda Hong to change the order for surgery to LEFT TOTAL KNEE since left is giving her more of problem. Please advise.

## 2021-11-07 NOTE — Telephone Encounter (Signed)
That's fine

## 2021-11-27 ENCOUNTER — Other Ambulatory Visit: Payer: Self-pay | Admitting: Family Medicine

## 2021-11-30 ENCOUNTER — Ambulatory Visit (INDEPENDENT_AMBULATORY_CARE_PROVIDER_SITE_OTHER): Payer: Medicare Other | Admitting: Family Medicine

## 2021-11-30 ENCOUNTER — Encounter: Payer: Self-pay | Admitting: Family Medicine

## 2021-11-30 DIAGNOSIS — Z01818 Encounter for other preprocedural examination: Secondary | ICD-10-CM | POA: Diagnosis not present

## 2021-11-30 DIAGNOSIS — F17200 Nicotine dependence, unspecified, uncomplicated: Secondary | ICD-10-CM | POA: Diagnosis not present

## 2021-11-30 NOTE — Assessment & Plan Note (Signed)
(<  1%) per Revised Cardiac Risk Index. DASI 6 mets Low risk OSA per STOP-BANG Only healing risk factor is ongoing tobacco dependence ~ 6-7 cig/d Kools.     Assessment Patient at low risk of complications for TKR - Dr Aurea Graff paperwork documenting this risk sent back to Dr Aurea Graff office. Discussed use of [place] Quit-Now line and use of 2 mg Nicotine lozenges prn

## 2021-11-30 NOTE — Patient Instructions (Signed)
Thank you for coming in to have your pre-surgery evaluation for Dr Alvan Dame and your possible knee surgery.  You are at low risk of complications from a knee surgery.    Dr Alvan Dame would like you to stop smoking 4 weeks before your surgery.  This will help you heal you surgery site.    Consider using Nicotine lozenges, 2 mg size.  Keep in mouth whenever you have the desire to smoke.   They cost less than two packs of cigarettes.     Call [facility] Get free tobacco cessation help 24/7 in several ways:  1-800-QUIT-NOW 205-602-8789); [employer] provides free cessation services to any [place] resident who needs help quitting commercial tobacco use, which includes all tobacco products offered for sale, not tobacco used for sacred and traditional ceremonies by many [place] tribes and communities. Quit Coaching is available in different forms, which can be used separately or together, to help any tobacco user give up tobacco.

## 2021-11-30 NOTE — Assessment & Plan Note (Signed)
Established problem Uncontrolled.  Patient is not at goal of smoking cessation No COPD symptoms. Current 1/3 pack a day of Kools 12.5 pack year history History of stopping for one year Recommend stop 4 weeks prior to surgery to improve healing postop. Given [place] Quit-Now information Recommend OTC 2 mg Nicotine Lozenges prn

## 2021-11-30 NOTE — Progress Notes (Signed)
  Proposed sugery:        High Risk: (Intraperitoneal, Intrathoracic, Suprainguinal vascular):  Major Conditions: No.        Ischemic Heart Disease (History of MI,  History of positive exercise test, Current chest paint considered due to myocardial ischemia, Use of nitrate therapy, ECG with pathological Q waves): no       History of Heart Failure (Pulmonary edema, bilateral rales or S3 gallop, Paroxysmal nocturnal dyspnea, CXR showing pulmonary vascular redistribution): No       Insulin therapy: no       SCr > 2.0: no  Functional Capacity Carry Heavy bags: yes Move furniture: yes Walk up flight of stairs: yes  Prior surgical experiences  Difficulty with surgical procedures in the past:no History of difficulty with anesthesia: no  Risks of Surgical Complications History of venous thromboembolic disease: no History of easy bleeding with procedures: no  Family history of venous thromboembolic disease: no Family history of bleeding diathesis: no   Smoking (Quit 4 weeks before surgery): No     Low Risk OSA (+)  According to the Revised Cardiac Risk Index (RCRI), her  Score 1 for h/o CVA  Her   according to the [facility] Activity Status Index (DASI). 27 points (6 mets activity)   Low-risk patients -- Patients whose estimated risk of death is less than 1 percent are labeled as being low risk and require no additional cardiovascular testing. Higher-risk patients -- Patients whose risk of death is 1 percent or higher may require additional cardiovascular evaluation.  Patient's functional capacity plays an important role.  In patients who can perform ?4 METs of activity, do not order additional tests. For those whose functional capacity is lower or unknown, additional testing may be indicated. Consult cardiology.      Total time spent in prechartin, visit evaluation and counseling and documenting 38 minutes

## 2021-12-04 ENCOUNTER — Other Ambulatory Visit: Payer: Self-pay

## 2021-12-15 ENCOUNTER — Telehealth: Payer: Self-pay | Admitting: Orthopaedic Surgery

## 2021-12-15 NOTE — Telephone Encounter (Signed)
Patient left message on voicemail asking if her surgery would be done with a robotic cut.  Returned patient's call after speaking with Dr. Erlinda Hong to inform patient that it would not be done with a robotic cut.

## 2021-12-18 ENCOUNTER — Other Ambulatory Visit: Payer: Self-pay

## 2021-12-18 DIAGNOSIS — M7541 Impingement syndrome of right shoulder: Secondary | ICD-10-CM

## 2021-12-19 MED ORDER — GABAPENTIN 300 MG PO CAPS: 300.0000 mg | ORAL_CAPSULE | Freq: Two times a day (BID) | ORAL | 3 refills | Status: DC

## 2021-12-20 ENCOUNTER — Other Ambulatory Visit: Payer: Self-pay

## 2021-12-21 ENCOUNTER — Telehealth: Payer: Self-pay | Admitting: Orthopaedic Surgery

## 2021-12-21 NOTE — Telephone Encounter (Signed)
Patient called to cancel left total knee arthroplasty scheduled 01-15-22 with Dr Erlinda Hong at [facility].  Patient stated she wanted the robotic cut used on her left total knee and she had found a doctor that does this procedure n [place].

## 2022-01-03 ENCOUNTER — Other Ambulatory Visit (HOSPITAL_COMMUNITY): Payer: Medicare Other

## 2022-01-05 ENCOUNTER — Telehealth: Payer: Self-pay

## 2022-01-05 NOTE — Telephone Encounter (Signed)
Received fax from patient pharmacy to start metformin. Patient has been well controlled without metformin. A1C 5.8 3 months ago. Metformin not indicated at this time.

## 2022-01-08 ENCOUNTER — Other Ambulatory Visit: Payer: Self-pay

## 2022-01-08 ENCOUNTER — Other Ambulatory Visit: Payer: Self-pay | Admitting: Student

## 2022-01-08 DIAGNOSIS — Z1231 Encounter for screening mammogram for malignant neoplasm of breast: Secondary | ICD-10-CM

## 2022-01-08 MED ORDER — FAMOTIDINE 20 MG PO TABS: 20.0000 mg | ORAL_TABLET | Freq: Every day | ORAL | 3 refills | Status: DC

## 2022-01-10 ENCOUNTER — Telehealth: Payer: Self-pay

## 2022-01-15 ENCOUNTER — Encounter: Payer: Self-pay | Admitting: Student

## 2022-01-15 ENCOUNTER — Ambulatory Visit: Admit: 2022-01-15 | Payer: Medicare Other | Admitting: Orthopaedic Surgery

## 2022-01-15 ENCOUNTER — Other Ambulatory Visit: Payer: Self-pay

## 2022-01-15 ENCOUNTER — Ambulatory Visit (INDEPENDENT_AMBULATORY_CARE_PROVIDER_SITE_OTHER): Payer: Medicare Other | Admitting: Student

## 2022-01-15 VITALS — BP 143/92 | HR 94 | Wt 183.0 lb

## 2022-01-15 DIAGNOSIS — E119 Type 2 diabetes mellitus without complications: Secondary | ICD-10-CM

## 2022-01-15 DIAGNOSIS — J3089 Other allergic rhinitis: Secondary | ICD-10-CM

## 2022-01-15 DIAGNOSIS — M1712 Unilateral primary osteoarthritis, left knee: Secondary | ICD-10-CM

## 2022-01-15 SURGERY — ARTHROPLASTY, KNEE, TOTAL
Anesthesia: Spinal | Site: Knee | Laterality: Left

## 2022-01-15 MED ORDER — CETIRIZINE HCL 10 MG PO TABS: 10.0000 mg | ORAL_TABLET | Freq: Every day | ORAL | 3 refills | Status: AC

## 2022-01-15 MED ORDER — FLUTICASONE PROPIONATE 50 MCG/ACT NA SUSP: NASAL | 6 refills | Status: DC

## 2022-01-15 NOTE — Assessment & Plan Note (Addendum)
A1C at goal.  No acute concerns today. Patient was to discontinue metformin in June, however she was still taking this.  She will discontinue now. - Updated diabetic foot exam - Updated UACR - Follow-up in 3 months for A1c check

## 2022-01-15 NOTE — Progress Notes (Signed)
    SUBJECTIVE:   CHIEF COMPLAINT / HPI:   Medication management Patient has seasonal allergies, which are bothering her.  Zyrtec and Flonase improved symptoms, but she is running low with medications.  She would like his medications to be sent to her mail order pharmacy.  Additionally she reports that she has been taking metformin, ran out a couple weeks ago-however this was discontinued by PCP in June 2023.  We discussed that she does not need metformin at this time, we will follow-up with her in 3 months and recheck.  Healthcare maintenance She is due for diabetic foot exam and UACR.  She has appointment with optometry for diabetic eye exam this week, has mammogram scheduled for this week.  She reports that she is already received a flu shot at [place] this past week.  PERTINENT  PMH / PSH: HTN, type 2 diabetes, mild cognitive impairment  OBJECTIVE:   BP (!) 143/92   Pulse 94   Wt 183 lb (83 kg)   SpO2 100%   BMI 32.42 kg/m    HEENT: Normocephalic atraumatic head.  External ear normal, external nose normal, EOM intact bilaterally. RDO: Regular rate rhythm, no MRG Pulmonology: CTAB, normal work of breathing on room air Foot: Warm and dry.  No abrasions nor lesions noted.  Dorsal pedal pulses present bilaterally.  Gross sensation intact.  Muscle strength 5/5.  Filament testing showed sensation at 10 locations bilaterally.  ASSESSMENT/PLAN:   Non-seasonal allergic rhinitis Pruritic eyes, rhinorrhea, sneezing.  Zyrtec and Flonase relieve the symptoms.  She is almost out of this medication, and would like this to be sent to mail order. - Zyrtec and Flonase refilled sent to mail order  Type 2 diabetes mellitus without complications ([place]) P7X at goal.  No acute concerns today. Patient was to discontinue metformin in June, however she was still taking this.  She will discontinue now. - Updated diabetic foot exam - Updated UACR - Follow-up in 3 months for A1c check  Note for next  appointment - Discuss advanced care planning - Discuss smoking cessation  Leslie Dales, [place]

## 2022-01-15 NOTE — Assessment & Plan Note (Signed)
Pruritic eyes, rhinorrhea, sneezing.  Zyrtec and Flonase relieve the symptoms.  She is almost out of this medication, and would like this to be sent to mail order. - Zyrtec and Flonase refilled sent to mail order

## 2022-01-15 NOTE — Patient Instructions (Addendum)
It was great to see you! Thank you for allowing me to participate in your care!   I recommend that you always bring your medications to each appointment as this makes it easy to ensure we are on the correct medications and helps [place] not miss when refills are needed.  Our plans for today:  - I have refilled Zyrtec and Flonase to your mail order pharmacy. - Follow-up in 3 months to discuss diabetes and smoking. - You need an diabetes eye exam every year.  Please see your eye doctor.  Ask them to fax [place] a report of your exam   We are checking some labs today, I will call you if they are abnormal will send you a MyChart message or a letter if they are normal.  If you do not hear about your labs in the next 2 weeks please let us know.  Take care and seek immediate care sooner if you develop any concerns. Please remember to show up 15 minutes before your scheduled appointment time!  Leslie Dales, DO [facility]

## 2022-01-16 LAB — MICROALBUMIN / CREATININE URINE RATIO
Creatinine, Urine: 309.6 mg/dL
Microalb/Creat Ratio: 21 mg/g creat (ref 0–29)
Microalbumin, Urine: 65.8 ug/mL

## 2022-01-17 ENCOUNTER — Ambulatory Visit
Admission: RE | Admit: 2022-01-17 | Discharge: 2022-01-17 | Disposition: A | Discharge status: Home / Self Care | Payer: Medicare Other | Source: Ambulatory Visit | Attending: *Deleted | Admitting: *Deleted

## 2022-01-17 DIAGNOSIS — Z1231 Encounter for screening mammogram for malignant neoplasm of breast: Secondary | ICD-10-CM

## 2022-01-18 ENCOUNTER — Other Ambulatory Visit: Payer: Self-pay

## 2022-01-18 DIAGNOSIS — H52222 Regular astigmatism, left eye: Secondary | ICD-10-CM | POA: Diagnosis not present

## 2022-01-18 DIAGNOSIS — M7541 Impingement syndrome of right shoulder: Secondary | ICD-10-CM

## 2022-01-18 DIAGNOSIS — H524 Presbyopia: Secondary | ICD-10-CM | POA: Diagnosis not present

## 2022-01-18 DIAGNOSIS — H2513 Age-related nuclear cataract, bilateral: Secondary | ICD-10-CM | POA: Diagnosis not present

## 2022-01-18 DIAGNOSIS — H40012 Open angle with borderline findings, low risk, left eye: Secondary | ICD-10-CM | POA: Diagnosis not present

## 2022-01-18 DIAGNOSIS — H5202 Hypermetropia, left eye: Secondary | ICD-10-CM | POA: Diagnosis not present

## 2022-01-18 MED ORDER — GABAPENTIN 300 MG PO CAPS: 300.0000 mg | ORAL_CAPSULE | Freq: Two times a day (BID) | ORAL | 3 refills | Status: DC

## 2022-01-25 ENCOUNTER — Telehealth: Payer: Self-pay

## 2022-01-30 ENCOUNTER — Encounter: Payer: Medicare Other | Admitting: Orthopaedic Surgery

## 2022-02-03 ENCOUNTER — Other Ambulatory Visit: Payer: Self-pay | Admitting: Family Medicine

## 2022-04-12 ENCOUNTER — Encounter: Payer: Self-pay | Admitting: Student

## 2022-04-12 ENCOUNTER — Ambulatory Visit (INDEPENDENT_AMBULATORY_CARE_PROVIDER_SITE_OTHER): Payer: Medicare Other | Admitting: Student

## 2022-04-12 VITALS — BP 139/80 | HR 92 | Ht 63.0 in | Wt 189.6 lb

## 2022-04-12 DIAGNOSIS — E119 Type 2 diabetes mellitus without complications: Secondary | ICD-10-CM | POA: Diagnosis not present

## 2022-04-12 DIAGNOSIS — J3089 Other allergic rhinitis: Secondary | ICD-10-CM | POA: Diagnosis not present

## 2022-04-12 DIAGNOSIS — Z7184 Encounter for health counseling related to travel: Secondary | ICD-10-CM

## 2022-04-12 DIAGNOSIS — Z2989 Encounter for other specified prophylactic measures: Secondary | ICD-10-CM | POA: Insufficient documentation

## 2022-04-12 LAB — POCT GLYCOSYLATED HEMOGLOBIN (HGB A1C): HbA1c, POC (controlled diabetic range): 5.9 % (ref 0.0–7.0)

## 2022-04-12 MED ORDER — FLUTICASONE PROPIONATE 50 MCG/ACT NA SUSP: NASAL | 6 refills | Status: AC

## 2022-04-12 NOTE — Patient Instructions (Signed)
It was great to see you! Thank you for allowing me to participate in your care!   I recommend that you always bring your medications to each appointment as this makes it easy to ensure we are on the correct medications and helps [place] not miss when refills are needed.  Our plans for today:  - Starting on January 15, take 1 tablet of mefloquine once per week. It is very important you take the medication on the same day each week. I recommend taking the medication on January 15, 22, 29 and February 5, 12, 19, 26 and March 4, 11, 18 and 25 -Call [phone] to make an appointment to receive vaccinations for travel to [place]. Recommend Hep A, Rabies, Yellow fever and typhoid -I refilled your flonase  Take care and seek immediate care sooner if you develop any concerns. Please remember to show up 15 minutes before your scheduled appointment time!  Leslie Dales, DO [facility]

## 2022-04-12 NOTE — Progress Notes (Signed)
    SUBJECTIVE:   CHIEF COMPLAINT / HPI:   Travel to [place] Traveling to [place] Feb 5-28. Just wants to visit. Cousin builds houses there.  Would prefer a once a week medication for malaria prophylaxis. Recommend based on CDC: Malaria ppx Hep A Consider rabies Yellow fever vaccination Typhoid vaccination  Pruritic eyes Clear drainiage from etyes, pruritic. Taking zyrtec every day. Needs refill of Flonase.    OBJECTIVE:   BP 139/80   Pulse 92   Ht '5\' 3"'$  (1.6 m)   Wt 189 lb 9.6 oz (86 kg)   SpO2 100%   BMI 33.59 kg/m    General: NAD, well-appearing, well-nourished Respiratory: No respiratory distress, breathing comfortably, able to speak in full sentences Skin: warm and dry, no rashes noted on exposed skin Psych: Appropriate affect and mood  ASSESSMENT/PLAN:   Type 2 diabetes mellitus without complications (HCC) J0D 5.9 still in goal. No medications at this time.  -Continue with life-style modification  Non-seasonal allergic rhinitis -Refilled Flonase  Encounter for counseling for travel Traveling to [place] from May 28, 2022 to Jun 20, 2022. Recommend: Malaria prophylaxis, Hep A, Yellow Fever, Typhoid Fever, Rabies. Patient will call [facility] health department to make appointment for vaccination and malaria medication.     Leslie Dales, [place]

## 2022-04-12 NOTE — Assessment & Plan Note (Signed)
Refilled Flonase

## 2022-04-12 NOTE — Assessment & Plan Note (Signed)
A1C 5.9 still in goal. No medications at this time.  -Continue with life-style modification

## 2022-04-12 NOTE — Assessment & Plan Note (Signed)
Traveling to [place] from May 28, 2022 to Jun 20, 2022. Recommend: Malaria prophylaxis, Hep A, Yellow Fever, Typhoid Fever, Rabies. Patient will call [facility] health department to make appointment for vaccination and malaria medication.

## 2022-05-11 NOTE — Telephone Encounter (Signed)
Pharmacy is requesting MetFormin TAB for patient, if you would like the pt to have this medication please contact the pharmacy.

## 2022-05-18 ENCOUNTER — Other Ambulatory Visit: Payer: Self-pay

## 2022-05-19 MED ORDER — LISINOPRIL 10 MG PO TABS: 10.0000 mg | ORAL_TABLET | Freq: Every day | ORAL | 0 refills | Status: DC

## 2022-05-30 ENCOUNTER — Other Ambulatory Visit: Payer: Self-pay | Admitting: Student

## 2022-05-30 DIAGNOSIS — M7541 Impingement syndrome of right shoulder: Secondary | ICD-10-CM

## 2022-06-01 ENCOUNTER — Telehealth: Payer: Self-pay | Admitting: Student

## 2022-06-01 NOTE — Telephone Encounter (Signed)
Left message for patient to call back and schedule Medicare Annual Wellness Visit (AWV).   Please offer to do by telephone.  Left office number and my jabber [phone].  AWVI eligible as of 04/23/2009  Please schedule at anytime with Nurse Health Advisor.

## 2022-06-04 ENCOUNTER — Other Ambulatory Visit: Payer: Self-pay | Admitting: Family Medicine

## 2022-06-12 ENCOUNTER — Other Ambulatory Visit: Payer: Self-pay

## 2022-06-12 DIAGNOSIS — I10 Essential (primary) hypertension: Secondary | ICD-10-CM

## 2022-06-12 MED ORDER — AMLODIPINE BESYLATE 10 MG PO TABS: 10.0000 mg | ORAL_TABLET | Freq: Every day | ORAL | 3 refills | Status: AC

## 2022-08-13 ENCOUNTER — Ambulatory Visit (INDEPENDENT_AMBULATORY_CARE_PROVIDER_SITE_OTHER): Payer: 59 | Admitting: Family Medicine

## 2022-08-13 ENCOUNTER — Telehealth: Payer: Self-pay

## 2022-08-13 ENCOUNTER — Encounter: Payer: Self-pay | Admitting: Family Medicine

## 2022-08-13 VITALS — BP 147/88 | HR 84 | Ht 63.0 in | Wt 179.8 lb

## 2022-08-13 DIAGNOSIS — K047 Periapical abscess without sinus: Secondary | ICD-10-CM | POA: Diagnosis not present

## 2022-08-13 DIAGNOSIS — I152 Hypertension secondary to endocrine disorders: Secondary | ICD-10-CM

## 2022-08-13 DIAGNOSIS — E1159 Type 2 diabetes mellitus with other circulatory complications: Secondary | ICD-10-CM

## 2022-08-13 MED ORDER — DIPHENHYDRAMINE HCL 12.5 MG/5ML PO LIQD: 5.0000 mL | Freq: Three times a day (TID) | ORAL | 0 refills | Status: DC | PRN

## 2022-08-13 MED ORDER — LIDOCAINE VISCOUS HCL 2 % MT SOLN: 5.0000 mL | Freq: Three times a day (TID) | OROMUCOSAL | 0 refills | Status: DC | PRN

## 2022-08-13 MED ORDER — AMOXICILLIN-POT CLAVULANATE 875-125 MG PO TABS: 1.0000 | ORAL_TABLET | Freq: Two times a day (BID) | ORAL | 0 refills | Status: DC

## 2022-08-13 NOTE — Progress Notes (Unsigned)
    SUBJECTIVE:   CHIEF COMPLAINT / HPI:   Tooth Infection -pain in right lower molar -started 6 days ago -getting progressively worse -feels like the surrounding gums are swollen -no fever -no history of the similar problems in the past -has upcoming appt with the dentist on 08/21/22 -tried a dose of Tylenol with slight relief -patient smoking 0.5PPD  PERTINENT  PMH / PSH: tobacco use  OBJECTIVE:   BP (!) 147/88   Pulse 84   Ht  (1.6 m)   Wt 179 lb 12.8 oz (81.6 kg)   SpO2 98%   BMI 31.85 kg/m   Gen: uncomfortable appearing but NAD, able to participate in exam HEENT: poor dentition, obvious caries involving mandibular teeth, right lower molar appears infected with erythema and superficial ulceration of surrounding gums CV: RRR, normal S1/S2, no murmur Resp: Normal effort, lungs CTAB Extremities: no edema or cyanosis Skin: warm and dry, no rashes noted Neuro: alert, no obvious focal deficits Psych: Normal affect and mood   ASSESSMENT/PLAN:   Tooth Infection Right lower molar appears infected without obvious abscess. Rx sent for Augmentin BID x10 days. Also sent magic mouthwash for symptomatic relief. Recommend Tylenol  q8h for pain. Has appointment with dentist on 08/21/22 for definitive management.  Hypertension associated with diabetes (HCC) BP elevated today x2. Likely in the setting of acute tooth pain. Continue lisinopril  daily and amlodipine  daily. Return in 2 weeks for BP follow-up     Maury Dus, MD [facility]

## 2022-08-13 NOTE — Patient Instructions (Signed)
It was great to meet you!  I sent an antibiotic to your pharmacy. Take this twice daily.  I also sent a mouthwash to swish and spit three times daily for pain relief.  You can take Tylenol  every 8 hours for pain.  Follow up with your dentist as planned on the 30th.  Please return in 2 weeks for blood pressure f/u.   Take care, Dr Anner Crete

## 2022-08-13 NOTE — Telephone Encounter (Signed)
Patient calls nurse line in regards to magic mouth wash.   She reports difficulties picking this up. She reports she was able to pick up the Amoxicillin.   I called [facility] and the pharmacy reports they do not dispense magic mouth wash anymore. She suggested we send prescription to [facility].   I called and discussed with patient. She would like for [place] to send the medication there.   Will send medication.

## 2022-08-14 NOTE — Assessment & Plan Note (Signed)
BP elevated today x2. Likely in the setting of acute tooth pain. Continue lisinopril  daily and amlodipine  daily. Return in 2 weeks for BP follow-up

## 2022-08-27 ENCOUNTER — Ambulatory Visit: Payer: 59 | Admitting: Family Medicine

## 2022-09-14 ENCOUNTER — Other Ambulatory Visit: Payer: Self-pay | Admitting: Student

## 2022-09-19 ENCOUNTER — Ambulatory Visit (INDEPENDENT_AMBULATORY_CARE_PROVIDER_SITE_OTHER): Payer: 59 | Admitting: *Deleted

## 2022-09-19 DIAGNOSIS — Z0000 Encounter for general adult medical examination without abnormal findings: Secondary | ICD-10-CM | POA: Diagnosis not present

## 2022-09-19 NOTE — Progress Notes (Cosign Needed)
Subjective:   Wendy Heath is a 72 y.o. female who presents for Medicare Annual (Subsequent) preventive examination.  I connected with  Wendy Heath on 09/19/22 by a telephone enabled telemedicine application and verified that I am speaking with the correct person using two identifiers.   I discussed the limitations of evaluation and management by telemedicine. The patient expressed understanding and agreed to proceed.  Patient location: home  Provider location: telephone/medicare     Review of Systems     Cardiac Risk Factors include: advanced age (>10men, >63 women);family history of premature cardiovascular disease;hypertension     Objective:    Today's Vitals   There is no height or weight on file to calculate BMI.     09/19/2022    3:35 PM 04/12/2022    1:40 PM 01/15/2022   11:30 AM 10/17/2021    9:57 AM 10/05/2021    9:59 AM 09/25/2021   10:34 AM 09/30/2020    8:26 AM  Advanced Directives  Does Patient Have a Medical Advance Directive? No No No Yes No No No  Type of Advance Directive    Living will     Does patient want to make changes to medical advance directive?    No - Patient declined     Would patient like information on creating a medical advance directive? No - Patient declined No - Patient declined No - Patient declined  No - Patient declined No - Patient declined No - Patient declined    Current Medications (verified) Outpatient Encounter Medications as of 09/19/2022  Medication Sig   amLODipine (NORVASC) 10 MG tablet Take 1 tablet (10 mg total) by mouth at bedtime. NEEDS APPT FOR FUTURE REFILLS.   aspirin 81 MG tablet Take 81 mg by mouth daily.    cetirizine (ZYRTEC) 10 MG tablet Take 1 tablet (10 mg total) by mouth daily.   cholecalciferol (VITAMIN D) 400 UNITS TABS Take 1,000 Units by mouth daily.    famotidine (PEPCID) 20 MG tablet TAKE 1 TABLET BY MOUTH DAILY   fluticasone (FLONASE) 50 MCG/ACT nasal spray USE 2 SPRAYS IN BOTH   NOSTRILS DAILY   gabapentin (NEURONTIN) 300 MG capsule TAKE 1 CAPSULE BY MOUTH TWICE  DAILY   lisinopril (ZESTRIL) 10 MG tablet TAKE 1 TABLET BY MOUTH AT  BEDTIME   magic mouthwash (lidocaine, diphenhydrAMINE, alum & mag hydroxide) suspension Swish and spit 5 mLs 3 (three) times daily as needed for mouth pain.   rosuvastatin (CRESTOR) 10 MG tablet TAKE 1 TABLET BY MOUTH DAILY   amoxicillin-clavulanate (AUGMENTIN) 875-125 MG tablet Take 1 tablet by mouth 2 (two) times daily. (Patient not taking: Reported on 09/19/2022)   benzonatate (TESSALON) 200 MG capsule Take 1 capsule (200 mg total) by mouth 2 (two) times daily as needed for cough. (Patient not taking: Reported on 01/15/2022)   diclofenac Sodium (VOLTAREN) 1 % GEL Apply 4 g topically 4 (four) times daily. Apply to both knees 4 times a day (Patient not taking: Reported on 09/19/2022)   [DISCONTINUED] famotidine (PEPCID) 20 MG tablet Take 1 tablet (20 mg total) by mouth daily.   No facility-administered encounter medications on file as of 09/19/2022.    Allergies (verified) Patient has no known allergies.   History: Past Medical History:  Diagnosis Date   Abnormal mammogram of left breast 05/28/2019   12/2018 [facility] MM: Diagnostic left mammogram is recommended in 6 months to confirm stability of Probably benign calcifications at the lumpectomy site in the left breast  without definite appreciable change compared to multiple priors.     Blind right eye 07/04/2011   Since stroke 2003   CEREBROVASCULAR ACCIDENT, HX OF 08/24/2006   Chronic pain of both knees 12/28/2008   Qualifier: Diagnosis of  By: Daphine Deutscher FNP, [place]     Diverticulitis    Dizziness 05/16/2018   DOMESTIC ABUSE, HX OF 08/24/2006   Qualifier: Diagnosis of  By: Barbaraann Barthel MD, [place]     GERD 08/24/2006   History of left breast cancer 03/25/2015   HYPERLIPIDEMIA 08/24/2006   Hyperlipidemia associated with type 2 diabetes mellitus (HCC) 08/24/2006   Qualifier: Diagnosis of  By: Barbaraann Barthel MD,  [place]     HYPERTENSION 08/24/2006   Hypertension associated with diabetes (HCC) 08/24/2006   Qualifier: Diagnosis of  By: Barbaraann Barthel MD, [place]     Impaired glucose tolerance 07/04/2011   Malignant neoplasm of breast (female), unspecified site 03/13/2011   Nodule of skin of right hand 12/15/2019   OBESITY 12/28/2008   Qualifier: Diagnosis of  By: Daphine Deutscher FNP, [place]     Pain in both thighs 04/01/2018   Personal history of chemotherapy    Personal history of radiation therapy 12/26/2006   Postural hypotension 06/19/2018   Seasonal allergies 05/14/2012   Stroke (HCC) 2005   Type 2 diabetes mellitus without complications (HCC) 05/20/2016   Past Surgical History:  Procedure Laterality Date   ABDOMINAL HYSTERECTOMY  1995   BREAST BIOPSY  06/10/2006   malignant   BREAST LUMPECTOMY Left 11/25/2006   malignant   COLONOSCOPY N/A 02/05/2013   Procedure: COLONOSCOPY;  Surgeon: Theda Belfast, MD;  Location: [facility];  Service: Endoscopy;  Laterality: N/A;   Family History  Problem Relation Age of Onset   Stroke Other    Cancer Other        breast cancer   Cancer Other        breast cancer   Stroke Other    Hypertension Other    Diabetes Other    Sudden death Other    Breast cancer Mother    Heart disease Father    Breast cancer Sister    Heart disease Brother    Social History   Socioeconomic History   Marital status: Divorced    Spouse name: Not on file   Number of children: 3   Years of education: 11   Highest education level: Not on file  Occupational History    Employer: UNEMPLOYED  Tobacco Use   Smoking status: Every Day    Packs/day: 0.25    Years: 50.00    Additional pack years: 0.00    Total pack years: 12.50    Types: Cigarettes    Passive exposure: Current   Smokeless tobacco: Never  Vaping Use   Vaping Use: Never used  Substance and Sexual Activity   Alcohol use: Yes    Comment: Once a month.    Drug use: No   Sexual activity: Not Currently  Other  Topics Concern   Not on file  Social History Narrative   Family/Social Information   patient lives alone . Patient enjoys reading, listening to music and watching TV . Primary family support persons is son [name].    Transportation to appointments provided by daughter and granddaughter.   Receives the following community support services Social Security , food and nutritions benefits.    Strengths: Manufacturing systems engineer, Special hobby/interest, Supportive family/friends   primary caregiver? son.  Completed Caregiver stress assessment with a score of 18 indication  of little to no stress.      Sammuel Hines, LCSW   Clinical Social Worker      Social Determinants of Health   Financial Resource Strain: Low Risk  (09/19/2022)   Overall Financial Resource Strain (CARDIA)    Difficulty of Paying Living Expenses: Not hard at all  Food Insecurity: No Food Insecurity (09/19/2022)   Hunger Vital Sign    Worried About Running Out of Food in the Last Year: Never true    Ran Out of Food in the Last Year: Never true  Transportation Needs: No Transportation Needs (09/19/2022)   PRAPARE - Administrator, Civil Service (Medical): No    Lack of Transportation (Non-Medical): No  Physical Activity: Inactive (09/19/2022)   Exercise Vital Sign    Days of Exercise per Week: 0 days    Minutes of Exercise per Session: 0 min  Stress: No Stress Concern Present (09/19/2022)   [employer] of Occupational Health - Occupational Stress Questionnaire    Feeling of Stress : Not at all  Social Connections: Socially Isolated (09/19/2022)   Social Connection and Isolation Panel [NHANES]    Frequency of Communication with Friends and Family: More than three times a week    Frequency of Social Gatherings with Friends and Family: Once a week    Attends Religious Services: Never    Database administrator or Organizations: No    Attends Banker Meetings: Never    Marital Status: Widowed     Tobacco Counseling Ready to quit: Not Answered Counseling given: Not Answered   Clinical Intake:  Pre-visit preparation completed: Yes  Pain : No/denies pain     Diabetes: No  How often do you need to have someone help you when you read instructions, pamphlets, or other written materials from your doctor or pharmacy?: 1 - Never  Diabetic?  No   is off all diabetic medication   Interpreter Needed?: No  Information entered by :: Remi Haggard LPN   Activities of Daily Living    09/19/2022    3:35 PM  In your present state of health, do you have any difficulty performing the following activities:  Hearing? 0  Vision? 0  Difficulty concentrating or making decisions? 0  Walking or climbing stairs? 0  Dressing or bathing? 0  Doing errands, shopping? 0  Preparing Food and eating ? N  Using the Toilet? N  In the past six months, have you accidently leaked urine? N  Do you have problems with loss of bowel control? N  Managing your Medications? N  Managing your Finances? N  Housekeeping or managing your Housekeeping? N    Patient Care Team: Tiffany Kocher, DO as PCP - General (Family Medicine)  Indicate any recent Medical Services you may have received from other than [facility] providers in the past year (date may be approximate).     Assessment:   This is a routine wellness examination for Mliss.  Hearing/Vision screen Hearing Screening - Comments:: No trouble hearing Vision Screening - Comments:: Up to date Unsure of name  Dietary issues and exercise activities discussed: Current Exercise Habits: The patient does not participate in regular exercise at present   Goals Addressed             This Visit's Progress    Patient Stated       Stay healthy       Depression Screen    09/19/2022    3:39 PM 08/13/2022  2:36 PM 04/12/2022    1:40 PM 01/15/2022   11:30 AM 11/30/2021    8:29 AM 10/17/2021    9:54 AM 10/05/2021   10:02 AM  PHQ 2/9 Scores   PHQ - 2 Score 0 2 3 0 0 0 0  PHQ- 9 Score 0 6 5 0 0 4 0    Fall Risk    09/19/2022    3:34 PM 08/13/2022    2:37 PM 04/12/2022    1:40 PM 01/15/2022   11:30 AM 11/30/2021    8:29 AM  Fall Risk   Falls in the past year? 1 0 0 0 0  Number falls in past yr: 0  0 0 0  Injury with Fall? 0  0 0 0  Follow up Falls evaluation completed;Education provided;Falls prevention discussed        FALL RISK PREVENTION PERTAINING TO THE HOME:  Any stairs in or around the home? No  If so, are there any without handrails? No  Home free of loose throw rugs in walkways, pet beds, electrical cords, etc? Yes  Adequate lighting in your home to reduce risk of falls? Yes   ASSISTIVE DEVICES UTILIZED TO PREVENT FALLS:  Life alert? No  Use of a cane, walker or w/c? No  Grab bars in the bathroom? Yes  Shower chair or bench in shower? No  Elevated toilet seat or a handicapped toilet? No   TIMED UP AND GO:  Was the test performed? No .   Cognitive Function:      05/28/2019    2:59 PM  Montreal Cognitive Assessment   Visuospatial/ Executive (0/5) 2  Naming (0/3) 3  Attention: Read list of digits (0/2) 2  Attention: Read list of letters (0/1) 1  Attention: Serial 7 subtraction starting at 100 (0/3) 2  Language: Repeat phrase (0/2) 1  Language : Fluency (0/1) 0  Abstraction (0/2) 0  Delayed Recall (0/5) 3  Orientation (0/6) 6  Total 20  Adjusted Score (based on education) 21      09/19/2022    3:36 PM 05/18/2019    9:11 AM  6CIT Screen  What Year? 0 points 0 points  What month? 0 points 0 points  What time? 0 points 0 points  Count back from 20 0 points 2 points  Months in reverse 0 points 2 points  Repeat phrase 0 points 0 points  Total Score 0 points 4 points    Immunizations Immunization History  Administered Date(s) Administered   Fluad Quad(high Dose 65+) 01/05/2020   Influenza Split 01/02/2012   Influenza Whole 02/04/2006, 02/10/2007, 01/28/2008   Influenza, High Dose Seasonal  PF 01/30/2017, 01/30/2018, 02/13/2019   Influenza,inj,Quad PF,6+ Mos 02/04/2013, 05/18/2016   Influenza-Unspecified 02/10/2014, 01/21/2017, 01/08/2022   PFIZER Comirnaty(Gray Top)Covid-19 Tri-Sucrose Vaccine 05/30/2019, 06/20/2019, 03/01/2020, 09/30/2020   Pneumococcal Conjugate-13 05/18/2016   Pneumococcal Polysaccharide-23 11/21/2004, 07/22/2017   Td 11/21/2004   Tdap 04/06/2019   Zoster Recombinat (Shingrix) 04/06/2019, 09/10/2019   Zoster, Live 07/02/2012    TDAP status: Up to date  Flu Vaccine status: Up to date  Pneumococcal vaccine status: Up to date  Covid-19 vaccine status: Information provided on how to obtain vaccines.   Qualifies for Shingles Vaccine? No   Zostavax completed Yes   Shingrix Completed?: Yes  Screening Tests Health Maintenance  Topic Date Due   OPHTHALMOLOGY EXAM  05/23/2021   Diabetic kidney evaluation - eGFR measurement  10/06/2022   HEMOGLOBIN A1C  10/12/2022   INFLUENZA VACCINE  11/22/2022  Diabetic kidney evaluation - Urine ACR  01/16/2023   FOOT EXAM  01/16/2023   Colonoscopy  02/06/2023   Medicare Annual Wellness (AWV)  09/19/2023   MAMMOGRAM  01/18/2024   DTaP/Tdap/Td (3 - Td or Tdap) 04/05/2029   Pneumonia Vaccine 67+ Years old  Completed   DEXA SCAN  Completed   Hepatitis C Screening  Completed   Zoster Vaccines- Shingrix  Completed   HPV VACCINES  Aged Out   COVID-19 Vaccine  Discontinued    Health Maintenance  Health Maintenance Due  Topic Date Due   OPHTHALMOLOGY EXAM  05/23/2021   Diabetic kidney evaluation - eGFR measurement  10/06/2022    Colorectal cancer screening: Type of screening: Colonoscopy. Completed 2014. Repeat every 10 years  Mammogram status: Completed  . Repeat every year  Bone Density status: Completed 2021. Results reflect: Bone density results: NORMAL. Repeat every 5 years.  Lung Cancer Screening: (Low Dose CT Chest recommended if Age 48-80 years, 30 pack-year currently smoking OR have quit w/in  15years.) does not qualify.   Lung Cancer Screening Referral:   Additional Screening:  Hepatitis C Screening: does not qualify; Completed 2017  Vision Screening: Recommended annual ophthalmology exams for early detection of glaucoma and other disorders of the eye. Is the patient up to date with their annual eye exam?  Yes  Who is the provider or what is the name of the office in which the patient attends annual eye exams? Unsure of name If pt is not established with a provider, would they like to be referred to a provider to establish care? No .   Dental Screening: Recommended annual dental exams for proper oral hygiene  Community Resource Referral / Chronic Care Management: CRR required this visit?  No   CCM required this visit?  No      Plan:     I have personally reviewed and noted the following in the patient's chart:   Medical and social history Use of alcohol, tobacco or illicit drugs  Current medications and supplements including opioid prescriptions. Patient is not currently taking opioid prescriptions. Functional ability and status Nutritional status Physical activity Advanced directives List of other physicians Hospitalizations, surgeries, and ER visits in previous 12 months Vitals Screenings to include cognitive, depression, and falls Referrals and appointments  In addition, I have reviewed and discussed with patient certain preventive protocols, quality metrics, and best practice recommendations. A written personalized care plan for preventive services as well as general preventive health recommendations were provided to patient.     Remi Haggard, LPN   1/61/0960   Nurse Notes:

## 2022-09-19 NOTE — Patient Instructions (Signed)
Wendy Heath , Thank you for taking time to come for your Medicare Wellness Visit. I appreciate your ongoing commitment to your health goals. Please review the following plan we discussed and let me know if I can assist you in the future.   Screening recommendations/referrals: Colonoscopy: up to date Mammogram: Education provided Bone Density: up to date Recommended yearly ophthalmology/optometry visit for glaucoma screening and checkup Recommended yearly dental visit for hygiene and checkup  Vaccinations: Influenza vaccine: up to date Pneumococcal vaccine: up to date Tdap vaccine: up to date Shingles vaccine: up to date    Advanced directives: Education provided      Preventive Care 65 Years and Older, Female Preventive care refers to lifestyle choices and visits with your health care provider that can promote health and wellness. What does preventive care include? A yearly physical exam. This is also called an annual well check. Dental exams once or twice a year. Routine eye exams. Ask your health care provider how often you should have your eyes checked. Personal lifestyle choices, including: Daily care of your teeth and gums. Regular physical activity. Eating a healthy diet. Avoiding tobacco and drug use. Limiting alcohol use. Practicing safe sex. Taking low-dose aspirin every day. Taking vitamin and mineral supplements as recommended by your health care provider. What happens during an annual well check? The services and screenings done by your health care provider during your annual well check will depend on your age, overall health, lifestyle risk factors, and family history of disease. Counseling  Your health care provider may ask you questions about your: Alcohol use. Tobacco use. Drug use. Emotional well-being. Home and relationship well-being. Sexual activity. Eating habits. History of falls. Memory and ability to understand (cognition). Work and work  Astronomer. Reproductive health. Screening  You may have the following tests or measurements: Height, weight, and BMI. Blood pressure. Lipid and cholesterol levels. These may be checked every 5 years, or more frequently if you are over 74 years old. Skin check. Lung cancer screening. You may have this screening every year starting at age 74 if you have a 30-pack-year history of smoking and currently smoke or have quit within the past 15 years. Fecal occult blood test (FOBT) of the stool. You may have this test every year starting at age 74. Flexible sigmoidoscopy or colonoscopy. You may have a sigmoidoscopy every 5 years or a colonoscopy every 10 years starting at age 74. Hepatitis C blood test. Hepatitis B blood test. Sexually transmitted disease (STD) testing. Diabetes screening. This is done by checking your blood sugar (glucose) after you have not eaten for a while (fasting). You may have this done every 1-3 years. Bone density scan. This is done to screen for osteoporosis. You may have this done starting at age 74. Mammogram. This may be done every 1-2 years. Talk to your health care provider about how often you should have regular mammograms. Talk with your health care provider about your test results, treatment options, and if necessary, the need for more tests. Vaccines  Your health care provider may recommend certain vaccines, such as: Influenza vaccine. This is recommended every year. Tetanus, diphtheria, and acellular pertussis (Tdap, Td) vaccine. You may need a Td booster every 10 years. Zoster vaccine. You may need this after age 65. Pneumococcal 13-valent conjugate (PCV13) vaccine. One dose is recommended after age 74. Pneumococcal polysaccharide (PPSV23) vaccine. One dose is recommended after age 74. Talk to your health care provider about which screenings and vaccines you need and  how often you need them. This information is not intended to replace advice given to you by  your health care provider. Make sure you discuss any questions you have with your health care provider. Document Released: 05/06/2015 Document Revised: 12/28/2015 Document Reviewed: 02/08/2015 Elsevier Interactive Patient Education  2017 [employer].  Fall Prevention in the Home Falls can cause injuries. They can happen to people of all ages. There are many things you can do to make your home safe and to help prevent falls. What can I do on the outside of my home? Regularly fix the edges of walkways and driveways and fix any cracks. Remove anything that might make you trip as you walk through a door, such as a raised step or threshold. Trim any bushes or trees on the path to your home. Use bright outdoor lighting. Clear any walking paths of anything that might make someone trip, such as rocks or tools. Regularly check to see if handrails are loose or broken. Make sure that both sides of any steps have handrails. Any raised decks and porches should have guardrails on the edges. Have any leaves, snow, or ice cleared regularly. Use sand or salt on walking paths during winter. Clean up any spills in your garage right away. This includes oil or grease spills. What can I do in the bathroom? Use night lights. Install grab bars by the toilet and in the tub and shower. Do not use towel bars as grab bars. Use non-skid mats or decals in the tub or shower. If you need to sit down in the shower, use a plastic, non-slip stool. Keep the floor dry. Clean up any water that spills on the floor as soon as it happens. Remove soap buildup in the tub or shower regularly. Attach bath mats securely with double-sided non-slip rug tape. Do not have throw rugs and other things on the floor that can make you trip. What can I do in the bedroom? Use night lights. Make sure that you have a light by your bed that is easy to reach. Do not use any sheets or blankets that are too big for your bed. They should not hang  down onto the floor. Have a firm chair that has side arms. You can use this for support while you get dressed. Do not have throw rugs and other things on the floor that can make you trip. What can I do in the kitchen? Clean up any spills right away. Avoid walking on wet floors. Keep items that you use a lot in easy-to-reach places. If you need to reach something above you, use a strong step stool that has a grab bar. Keep electrical cords out of the way. Do not use floor polish or wax that makes floors slippery. If you must use wax, use non-skid floor wax. Do not have throw rugs and other things on the floor that can make you trip. What can I do with my stairs? Do not leave any items on the stairs. Make sure that there are handrails on both sides of the stairs and use them. Fix handrails that are broken or loose. Make sure that handrails are as long as the stairways. Check any carpeting to make sure that it is firmly attached to the stairs. Fix any carpet that is loose or worn. Avoid having throw rugs at the top or bottom of the stairs. If you do have throw rugs, attach them to the floor with carpet tape. Make sure that you have  a light switch at the top of the stairs and the bottom of the stairs. If you do not have them, ask someone to add them for you. What else can I do to help prevent falls? Wear shoes that: Do not have high heels. Have rubber bottoms. Are comfortable and fit you well. Are closed at the toe. Do not wear sandals. If you use a stepladder: Make sure that it is fully opened. Do not climb a closed stepladder. Make sure that both sides of the stepladder are locked into place. Ask someone to hold it for you, if possible. Clearly mark and make sure that you can see: Any grab bars or handrails. First and last steps. Where the edge of each step is. Use tools that help you move around (mobility aids) if they are needed. These  include: Canes. Walkers. Scooters. Crutches. Turn on the lights when you go into a dark area. Replace any light bulbs as soon as they burn out. Set up your furniture so you have a clear path. Avoid moving your furniture around. If any of your floors are uneven, fix them. If there are any pets around you, be aware of where they are. Review your medicines with your doctor. Some medicines can make you feel dizzy. This can increase your chance of falling. Ask your doctor what other things that you can do to help prevent falls. This information is not intended to replace advice given to you by your health care provider. Make sure you discuss any questions you have with your health care provider. Document Released: 02/03/2009 Document Revised: 09/15/2015 Document Reviewed: 05/14/2014 Elsevier Interactive Patient Education  2017 [employer].

## 2022-10-18 ENCOUNTER — Other Ambulatory Visit: Payer: Self-pay | Admitting: Student

## 2023-01-03 ENCOUNTER — Other Ambulatory Visit: Payer: Self-pay | Admitting: Nurse Practitioner

## 2023-01-03 DIAGNOSIS — Z1231 Encounter for screening mammogram for malignant neoplasm of breast: Secondary | ICD-10-CM

## 2023-01-25 ENCOUNTER — Ambulatory Visit
Admission: RE | Admit: 2023-01-25 | Discharge: 2023-01-25 | Disposition: A | Discharge status: Home / Self Care | Payer: 59 | Source: Ambulatory Visit | Attending: Nurse Practitioner | Admitting: Nurse Practitioner

## 2023-01-25 DIAGNOSIS — Z1231 Encounter for screening mammogram for malignant neoplasm of breast: Secondary | ICD-10-CM

## 2023-02-15 ENCOUNTER — Other Ambulatory Visit: Payer: Self-pay | Admitting: Student

## 2023-02-15 DIAGNOSIS — M7541 Impingement syndrome of right shoulder: Secondary | ICD-10-CM

## 2023-05-10 ENCOUNTER — Telehealth: Payer: Self-pay | Admitting: Dietician

## 2023-05-10 NOTE — Telephone Encounter (Signed)
Returned patient call. Referral was for an Endocrinologist appointment. Sent message to the front desk and provided patient with our number to schedule a number with a doctor.  Oran Rein, RD, LDN, CDCES, DipACLM

## 2023-05-29 ENCOUNTER — Encounter: Payer: Self-pay | Admitting: Internal Medicine

## 2023-05-29 ENCOUNTER — Ambulatory Visit: Payer: 59 | Attending: Internal Medicine | Admitting: Internal Medicine

## 2023-05-29 VITALS — BP 125/77 | HR 86 | Resp 14 | Ht 61.75 in | Wt 181.0 lb

## 2023-05-29 DIAGNOSIS — M1711 Unilateral primary osteoarthritis, right knee: Secondary | ICD-10-CM | POA: Diagnosis not present

## 2023-05-29 DIAGNOSIS — D1721 Benign lipomatous neoplasm of skin and subcutaneous tissue of right arm: Secondary | ICD-10-CM | POA: Diagnosis not present

## 2023-05-29 DIAGNOSIS — M130 Polyarthritis, unspecified: Secondary | ICD-10-CM

## 2023-05-29 MED ORDER — LIDOCAINE HCL 1 % IJ SOLN
2.0000 mL | INTRAMUSCULAR | Status: AC | PRN
  Administered 2023-05-29: 2 mL

## 2023-05-29 MED ORDER — TRIAMCINOLONE ACETONIDE 40 MG/ML IJ SUSP
40.0000 mg | INTRAMUSCULAR | Status: AC | PRN
  Administered 2023-05-29: 40 mg via INTRA_ARTICULAR

## 2023-05-29 NOTE — Progress Notes (Signed)
 Office Visit Note  Patient: Wendy Heath             Date of Birth: [birth year]           MRN: [id number]             PCP: Howell Lunger, DO Referring: Joshua Harlene CROME, NP Visit Date: 05/29/2023  Subjective:  New Patient (Initial Visit) (Patient states she has pain in her right knee that goes down her shin. )   Discussed the use of AI scribe software for clinical note transcription with the patient, who gave verbal consent to proceed.  History of Present Illness   Wendy Heath is a 73 year old female with osteoarthritis who presents with severe right knee pain. She was referred for evaluation of her right knee pain, particularly to assess for any inflammatory problems contributing to her symptoms.  She experiences severe pain in her right knee, which has been ongoing for a significant period and has recently started radiating down into her shin over the past two weeks. The pain is described as 'killing me' and disrupts her sleep. She has been receiving steroid or cortisone injections for the past couple of years, which provide temporary relief, but the last injection over three months ago was less effective.  She is currently taking gabapentin  and Tylenol  for pain management, but finds them ineffective. Gabapentin  causes drowsiness. She does not use a knee brace regularly but relies on a cane for mobility, especially on stairs. She wants to return to the gym but is limited by her knee pain.  No history of inflammatory conditions such as rheumatoid arthritis and no family history of arthritis. 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  She has a long-standing swelling or nodule on her hand, present for years and occasionally painful. It was previously evaluated, and she recalls a cyst removal surgery before the lump appeared. No significant swelling in her right knee.     Activities of Daily Living:  Patient reports morning stiffness for less than 5 minutes.   Patient Reports nocturnal pain.  Difficulty dressing/grooming: Denies Difficulty climbing stairs: Reports Difficulty getting out of chair: Reports Difficulty using hands for taps, buttons, cutlery, and/or writing: Denies  Review of Systems  Constitutional:  Positive for fatigue.  HENT:  Negative for mouth sores and mouth dryness.   Eyes:  Negative for dryness.  Respiratory:  Negative for shortness of breath.   Cardiovascular:  Negative for chest pain and palpitations.  Gastrointestinal:  Positive for constipation. Negative for blood in stool and diarrhea.  Endocrine: Negative for increased urination.  Genitourinary:  Negative for involuntary urination.  Musculoskeletal:  Positive for joint pain, joint pain, muscle weakness and morning stiffness. Negative for gait problem, joint swelling, myalgias, muscle tenderness and myalgias.  Skin:  Positive for sensitivity to sunlight. Negative for color change, rash and hair loss.  Allergic/Immunologic: Negative for susceptible to infections.  Neurological:  Negative for dizziness and headaches.  Hematological:  Negative for swollen glands.  Psychiatric/Behavioral:  Negative for depressed mood and sleep disturbance. The patient is not nervous/anxious.     PMFS History:  Patient Active Problem List   Diagnosis Date Noted   Need for malaria prophylaxis 04/12/2022   Encounter for counseling for travel 04/12/2022   Non-seasonal allergic rhinitis 01/15/2022   Preoperative examination 11/30/2021   Tobacco dependence 11/30/2021   Primary osteoarthritis of  right knee 10/12/2021   Primary osteoarthritis of left knee 10/12/2021   OA (osteoarthritis) of knee 09/26/2021   Lipoma of right forearm 12/15/2019   Impingement syndrome of shoulder, right 10/24/2019   Sleep difficulties 06/01/2019   Mild cognitive impairment 05/28/2019   History of fall  05/28/2019   Type 2 diabetes mellitus without complications (HCC) 05/20/2016   Seasonal allergies 05/14/2012   Blind right eye 07/04/2011   OBESITY 12/28/2008   Constipation 12/28/2008   Primary localized osteoarthritis of both knees 12/28/2008   Hyperlipidemia associated with type 2 diabetes mellitus (HCC) 08/24/2006   Hypertension associated with diabetes (HCC) 08/24/2006   GERD 08/24/2006   History of cardiovascular disorder 08/24/2006    Past Medical History:  Diagnosis Date   Abnormal mammogram of left breast 05/28/2019   12/2018 Dg MM: Diagnostic left mammogram is recommended in 6 months to confirm stability of Probably benign calcifications at the lumpectomy site in the left breast without definite appreciable change compared to multiple priors.     Blind right eye 07/04/2011   Since stroke 2003   CEREBROVASCULAR ACCIDENT, HX OF 08/24/2006   Chronic pain of both knees 12/28/2008   Qualifier: Diagnosis of  By: Gladis FNP, Nykedtra     Diverticulitis    Dizziness 05/16/2018   DOMESTIC ABUSE, HX OF 08/24/2006   Qualifier: Diagnosis of  By: Loretha MD, Victoria     GERD 08/24/2006   History of left breast cancer 03/25/2015   HYPERLIPIDEMIA 08/24/2006   Hyperlipidemia associated with type 2 diabetes mellitus (HCC) 08/24/2006   Qualifier: Diagnosis of  By: Loretha MD, Victoria     HYPERTENSION 08/24/2006   Hypertension associated with diabetes (HCC) 08/24/2006   Qualifier: Diagnosis of  By: Loretha MD, Victoria     Impaired glucose tolerance 07/04/2011   Malignant neoplasm of breast (female), unspecified site 03/13/2011   Nodule of skin of right hand 12/15/2019   OBESITY 12/28/2008   Qualifier: Diagnosis of  By: Gladis FNP, Nykedtra     Pain in both thighs 04/01/2018   Personal history of chemotherapy    Personal history of radiation therapy 12/26/2006   Postural hypotension 06/19/2018   Seasonal allergies 05/14/2012   Stroke (HCC) 2005   Type 2 diabetes mellitus without complications (HCC)  05/20/2016    Family History  Problem Relation Age of Onset   Stroke Other    Cancer Other        breast cancer   Cancer Other        breast cancer   Stroke Other    Hypertension Other    Diabetes Other    Sudden death Other    Breast cancer Mother    Heart disease Father    Breast cancer Sister    Heart disease Brother    Past Surgical History:  Procedure Laterality Date   ABDOMINAL HYSTERECTOMY  1995   BREAST BIOPSY  06/10/2006   malignant   BREAST LUMPECTOMY Left 11/25/2006   malignant   COLONOSCOPY N/A 02/05/2013   Procedure: COLONOSCOPY;  Surgeon: Belvie JONETTA Just, MD;  Location: [facility];  Service: Endoscopy;  Laterality: N/A;   Social History   Social History Narrative   Family/Social Information   patient lives alone . Patient enjoys reading, listening to music and watching TV . Primary family support persons is son Sergio.    Transportation to appointments provided by daughter and granddaughter.   Receives the following community support services Social Security , food and nutritions benefits.  Strengths: Manufacturing systems engineer, Special hobby/interest, Supportive family/friends   primary caregiver? son.  Completed Caregiver stress assessment with a score of 18 indication of little to no stress.      Barnie Ada, LCSW   Clinical Social Worker      Immunization History  Administered Date(s) Administered   Fluad Quad(high Dose 65+) 01/05/2020   Influenza Split 01/02/2012   Influenza Whole 02/04/2006, 02/10/2007, 01/28/2008   Influenza, High Dose Seasonal PF 01/30/2017, 01/30/2018, 02/13/2019   Influenza,inj,Quad PF,6+ Mos 02/04/2013, 05/18/2016   Influenza-Unspecified 02/10/2014, 01/21/2017, 01/08/2022   PFIZER Comirnaty(Gray Top)Covid-19 Tri-Sucrose Vaccine 05/30/2019, 06/20/2019, 03/01/2020, 09/30/2020   Pneumococcal Conjugate-13 05/18/2016   Pneumococcal Polysaccharide-23 11/21/2004, 07/22/2017   Td 11/21/2004   Tdap 04/06/2019   Zoster  Recombinant(Shingrix ) 04/06/2019, 09/10/2019   Zoster, Live 07/02/2012     Objective: Vital Signs: BP 125/77 (BP Location: Right Arm, Patient Position: Sitting, Cuff Size: Large)   Pulse 86   Resp 14   Ht 5' 1.75 (1.568 m)   Wt 181 lb (82.1 kg)   BMI 33.37 kg/m    Physical Exam Eyes:     Conjunctiva/sclera: Conjunctivae normal.  Cardiovascular:     Rate and Rhythm: Normal rate and regular rhythm.  Pulmonary:     Effort: Pulmonary effort is normal.     Breath sounds: Normal breath sounds.  Musculoskeletal:     Right lower leg: No edema.     Left lower leg: No edema.  Lymphadenopathy:     Cervical: No cervical adenopathy.  Skin:    General: Skin is warm and dry.     Findings: No rash.  Neurological:     Mental Status: She is alert.  Psychiatric:        Mood and Affect: Mood normal.      Musculoskeletal Exam:  Elbows full ROM no tenderness or swelling Wrists full ROM no tenderness or swelling Fingers full ROM, nontender soft tissue swelling on dorsum of right hand at base of thumb and second metacarpal Knee range of motion slightly restricted in extension flexion is limited just past 90 degrees, right knee medial and lateral joint line tenderness to pressure, also tenderness to pressure over patella, no palpable effusion Limited ultrasound exam of the right hand findings consistent for large lipoma with some osteophyte at adjacent joints, no soft tissue swelling, hyperemia, or joint effusion   Investigation: No additional findings.  Imaging: No results found.  Recent Labs: Lab Results  Component Value Date   WBC 7.1 10/01/2018   HGB 13.3 10/01/2018   PLT 242 10/01/2018   NA 142 10/05/2021   K 4.4 10/05/2021   CL 104 10/05/2021   CO2 25 10/05/2021   GLUCOSE 99 10/05/2021   BUN 11 10/05/2021   CREATININE 0.79 10/05/2021   BILITOT 0.2 09/30/2020   ALKPHOS 112 09/30/2020   AST 19 09/30/2020   ALT 8 09/30/2020   PROT 7.0 09/30/2020   ALBUMIN 4.3 09/30/2020    CALCIUM  10.0 10/05/2021   GFRAA 71 10/23/2019    Speciality Comments: No specialty comments available.  Procedures:  Large Joint Inj: R knee on 05/29/2023 2:10 PM Indications: pain Details: 25 G 1.5 in needle, anterior approach Medications: 2 mL lidocaine  1 %; 40 mg triamcinolone  acetonide 40 MG/ML Procedure, treatment alternatives, risks and benefits explained, specific risks discussed. Consent was given by the patient. Immediately prior to procedure a time out was called to verify the correct patient, procedure, equipment, support staff and site/side marked as required. Patient was prepped and  draped in the usual sterile fashion.     Allergies: Patient has no known allergies.   Assessment / Plan:     Visit Diagnoses: Primary osteoarthritis of right knee - Plan: Large Joint Inj: R knee Chronic pain, exacerbated over the past two weeks with radiation down the shin. Previous corticosteroid injections provided temporary relief. Patient is not a surgical candidate and is interested in non-surgical interventions. -Administer corticosteroid injection today in right knee -Provide information on supplemental treatments safe to use with current blood thinner medication. -Consider viscosupplementation injections, discussed the effectiveness may sometimes not be as good with pre-existing already severe degenerative arthritis.  These would need to be later requiring prior authorization  Polyarthritis - Plan: Rheumatoid factor, Cyclic citrul peptide antibody, IgG, Sedimentation rate Clinically I do not see any definite synovitis or joint effusions. -Checking rheumatoid factor CCP and sedimentation rate screening for underlying inflammatory arthropathy contributing to her osteoarthritis and chronic joint pain  Lipoma on Right Hand Chronic, benign fatty mass causing occasional discomfort. No significant functional impairment. -No intervention planned at this time due to risk of nerve, tendon, and  vein damage.  History of Stroke Occurred in 2003, residual right sided vision deficit. Long term use of NSAIDs or other pain mediations with blood thinning effect limited by requiring long term aspirin prophylaxis.    Orders: Orders Placed This Encounter  Procedures   Large Joint Inj: R knee   Rheumatoid factor   Cyclic citrul peptide antibody, IgG   Sedimentation rate   No orders of the defined types were placed in this encounter.   Follow-Up Instructions: Return in about 3 months (around 08/26/2023), or if symptoms worsen or fail to improve.   Lonni LELON Ester, MD  Note - This record has been created using [employer].  Chart creation errors have been sought, but may not always  have been located. Such creation errors do not reflect on  the standard of medical care.

## 2023-05-29 NOTE — Progress Notes (Signed)
 I initially clicked LEFT side for a procedure note after the injection today which was on her RIGHT knee only. I corrected this before signing the note.  Is there any step needed to make sure there is not a procedure code generated and sent to insurance incorrectly? Thanks.

## 2023-05-29 NOTE — Patient Instructions (Signed)
For osteoarthritis several treatments may be beneficial:  - Topical antiinflammatory medicine such as diclofenac or Voltaren can be applied to  affected area as needed. Topical analgesics containing CBD, menthol, or lidocaine can be tried.  - Turmeric has some antiinflammatory effect similar to NSAIDs and may help, if taken as a supplement should not be taken above recommended doses.   - Compressive gloves or sleeve can be helpful to support the joint especially if hurting or swelling with certain activities.  - Physical therapy referral can discuss exercises or activity modification to improve symptoms or strength if needed.  - Local steroid injection is an option if symptoms become worse and not controlled by the above options.

## 2023-05-30 ENCOUNTER — Telehealth: Payer: Self-pay | Admitting: *Deleted

## 2023-05-30 DIAGNOSIS — M1711 Unilateral primary osteoarthritis, right knee: Secondary | ICD-10-CM

## 2023-05-30 DIAGNOSIS — M130 Polyarthritis, unspecified: Secondary | ICD-10-CM

## 2023-05-30 MED ORDER — TRAMADOL HCL 50 MG PO TABS: 50.0000 mg | ORAL_TABLET | Freq: Three times a day (TID) | ORAL | 0 refills | Status: AC | PRN

## 2023-05-30 NOTE — Telephone Encounter (Signed)
 Patient had injection in knee 05/30/2023, patient is in severe pain, patient cannot sleep, patient cannot bear weight and cannot walk this morning. Please advise.

## 2023-05-30 NOTE — Telephone Encounter (Signed)
 I spoke with Wendy Heath. Increased knee pain since yesterday afternoon after knee injection. Probably pain increased after walking on her severe knee OA after lidocaine  wore off. Steroid may need another 1-2 days to see improvement. Does not notice any change in swelling, erythema, or warmth. She took some aleve  so far with partial benefit. Recommended symptomatic management for now could also take tylenol , use heat as needed. Will send a prescription for 50 mg tramadol  to take as needed for next few days. If knee worsening recommended to return or needs urgent care eval.

## 2023-05-30 NOTE — Addendum Note (Signed)
 Addended by: Matt Song on: 05/30/2023 12:22 PM   Modules accepted: Orders

## 2023-05-31 LAB — CYCLIC CITRUL PEPTIDE ANTIBODY, IGG: Cyclic Citrullin Peptide Ab: <16 U

## 2023-05-31 LAB — SEDIMENTATION RATE: Sed Rate: 11 mm/h (ref 0–30)

## 2023-05-31 LAB — RHEUMATOID FACTOR: Rheumatoid fact SerPl-aCnc: <10 [IU]/mL (ref <14)

## 2023-06-28 ENCOUNTER — Other Ambulatory Visit: Payer: Self-pay

## 2023-06-28 DIAGNOSIS — E0590 Thyrotoxicosis, unspecified without thyrotoxic crisis or storm: Secondary | ICD-10-CM

## 2023-07-01 ENCOUNTER — Other Ambulatory Visit: Payer: Self-pay | Admitting: Student

## 2023-07-12 ENCOUNTER — Other Ambulatory Visit: Payer: 59

## 2023-07-15 ENCOUNTER — Encounter: Payer: Self-pay | Admitting: "Endocrinology

## 2023-07-15 ENCOUNTER — Ambulatory Visit (INDEPENDENT_AMBULATORY_CARE_PROVIDER_SITE_OTHER): Payer: 59 | Admitting: "Endocrinology

## 2023-07-15 ENCOUNTER — Other Ambulatory Visit: Payer: Self-pay

## 2023-07-15 ENCOUNTER — Other Ambulatory Visit: Payer: Self-pay | Admitting: "Endocrinology

## 2023-07-15 VITALS — BP 100/80 | HR 75 | Ht 61.0 in | Wt 178.0 lb

## 2023-07-15 DIAGNOSIS — E0590 Thyrotoxicosis, unspecified without thyrotoxic crisis or storm: Secondary | ICD-10-CM | POA: Diagnosis not present

## 2023-07-15 MED ORDER — METHIMAZOLE 5 MG PO TABS: 5.0000 mg | ORAL_TABLET | Freq: Every day | ORAL | 0 refills | Status: DC

## 2023-07-15 NOTE — Progress Notes (Addendum)
 Outpatient Endocrinology Note Wendy Monmouth, MD  07/15/23   Wendy Heath December 26, 1950 [id number]  Referring Provider: Ellyn Hack, MD Primary Care Provider: Tiffany Kocher, DO Subjective  No chief complaint on file.   Assessment & Plan  Diagnoses and all orders for this visit:  Subclinical hyperthyroidism -     T4, free -     T3, free -     TSH -     Thyroid stimulating immunoglobulin  Other orders -     Cancel: TRAb (TSH Receptor Binding Antibody)    Wendy Heath is currently taking no thyroid medication. Patient was biochemically hyperthyroid on last labs.  Discussed the etiology for hyperthyroidism. Educated on thyroid axis.  Recommend the following: do labs today. Discussed methimazole with side effects if needed to start. Untreated hyperthyroidism including atrial fibrillation, heart failure and osteoporosis Side effects of Methimazole include but not limited to allergic reaction, rash, bone marrow suppression, liver dysfunction and teratogenic potential  On methimazole, if you notice any symptoms of worsening fatigue, fever with sore throat, loss of appetite, yellowing of eyes, dark urine, joint pains, sores in the mouth, itchy rash, light colored stools or abdominal pain, please stop the medication and call us immediately as this can be a serious side effect of the medication.   I have reviewed current medications, nurse's notes, allergies, vital signs, past medical and surgical history, family medical history, and social history for this encounter. Counseled patient on symptoms, examination findings, lab findings, imaging results, treatment decisions and monitoring and prognosis. The patient understood the recommendations and agrees with the treatment plan. All questions regarding treatment plan were fully answered.   Return in about 3 months (around 10/15/2023) for visit + labs before next visit, labs today.   Wendy [place], MD   07/15/23   I have reviewed current medications, nurse's notes, allergies, vital signs, past medical and surgical history, family medical history, and social history for this encounter. Counseled patient on symptoms, examination findings, lab findings, imaging results, treatment decisions and monitoring and prognosis. The patient understood the recommendations and agrees with the treatment plan. All questions regarding treatment plan were fully answered.   History of Present Illness Wendy Heath is a 73 y.o. year old female who presents to our clinic with subclinical hyperthyroidism diagnosed in 03/2023.    Never been on thyroid medication   Symptoms suggestive of HYPOTHYROIDISM:  fatigue Yes weight gain No cold intolerance  No constipation  Yes  Symptoms suggestive of HYPERTHYROIDISM:  weight loss  Yes heat intolerance No hyperdefecation  No palpitations  No  Compressive symptoms:  dysphagia  No dysphonia  No positional dyspnea (especially with simultaneous arms elevation)  No  Smokes  Yes On biotin  No Personal history of head/neck surgery/irradiation  No  Reviewed records:  04/11/23 TSH 0.06, FT4 1.1 05/03/23: TSH 0.03, FT4 1.3, T3 27% TRAb <1  Physical Exam  BP 100/80   Pulse 75   Ht 5\' 1"  (1.549 m)   Wt 178 lb (80.7 kg)   SpO2 98%   BMI 33.63 kg/m  Constitutional: well developed, well nourished Head: normocephalic, atraumatic, no exophthalmos Eyes: sclera anicteric, no redness Neck: no thyromegaly, no thyroid tenderness; no nodules palpated Lungs: normal respiratory effort Neurology: alert and oriented, no fine hand tremor Skin: dry, no appreciable rashes Musculoskeletal: no appreciable defects Psychiatric: normal mood and affect  Allergies No Known Allergies  Current Medications Patient's Medications  New Prescriptions   No  medications on file  Previous Medications   AMLODIPINE (NORVASC) 10 MG TABLET    Take 1 tablet (10 mg total) by  mouth at bedtime. NEEDS APPT FOR FUTURE REFILLS.   AMOXICILLIN-CLAVULANATE (AUGMENTIN) 875-125 MG TABLET    Take 1 tablet by mouth 2 (two) times daily.   ASPIRIN 81 MG TABLET    Take 81 mg by mouth daily.    BENZONATATE (TESSALON) 200 MG CAPSULE    Take 1 capsule (200 mg total) by mouth 2 (two) times daily as needed for cough.   CETIRIZINE (ZYRTEC) 10 MG TABLET    Take 1 tablet (10 mg total) by mouth daily.   CHOLECALCIFEROL (VITAMIN D) 400 UNITS TABS    Take 1,000 Units by mouth daily.    DICLOFENAC SODIUM (VOLTAREN) 1 % GEL    Apply 4 g topically 4 (four) times daily. Apply to both knees 4 times a day   FAMOTIDINE (PEPCID) 20 MG TABLET    TAKE 1 TABLET BY MOUTH DAILY   FLUTICASONE (FLONASE) 50 MCG/ACT NASAL SPRAY    USE 2 SPRAYS IN BOTH  NOSTRILS DAILY   GABAPENTIN (NEURONTIN) 300 MG CAPSULE    TAKE 1 CAPSULE BY MOUTH TWICE  DAILY   LISINOPRIL (ZESTRIL) 10 MG TABLET    TAKE 1 TABLET BY MOUTH AT  BEDTIME   MAGIC MOUTHWASH (LIDOCAINE, DIPHENHYDRAMINE, ALUM & MAG HYDROXIDE) SUSPENSION    Swish and spit 5 mLs 3 (three) times daily as needed for mouth pain.   ROSUVASTATIN (CRESTOR) 10 MG TABLET    TAKE 1 TABLET BY MOUTH DAILY  Modified Medications   No medications on file  Discontinued Medications   No medications on file    Past Medical History Past Medical History:  Diagnosis Date   Abnormal mammogram of left breast 05/28/2019   12/2018 Dg MM: Diagnostic left mammogram is recommended in 6 months to confirm stability of Probably benign calcifications at the lumpectomy site in the left breast without definite appreciable change compared to multiple priors.     Blind right eye 07/04/2011   Since stroke 2003   CEREBROVASCULAR ACCIDENT, HX OF 08/24/2006   Chronic pain of both knees 12/28/2008   Qualifier: Diagnosis of  By: Daphine Deutscher FNP, [place]     Diverticulitis    Dizziness 05/16/2018   DOMESTIC ABUSE, HX OF 08/24/2006   Qualifier: Diagnosis of  By: Barbaraann Barthel MD, [place]     GERD 08/24/2006   History  of left breast cancer 03/25/2015   HYPERLIPIDEMIA 08/24/2006   Hyperlipidemia associated with type 2 diabetes mellitus (HCC) 08/24/2006   Qualifier: Diagnosis of  By: Barbaraann Barthel MD, [place]     HYPERTENSION 08/24/2006   Hypertension associated with diabetes (HCC) 08/24/2006   Qualifier: Diagnosis of  By: Barbaraann Barthel MD, [place]     Impaired glucose tolerance 07/04/2011   Malignant neoplasm of breast (female), unspecified site 03/13/2011   Nodule of skin of right hand 12/15/2019   OBESITY 12/28/2008   Qualifier: Diagnosis of  By: Daphine Deutscher FNP, [place]     Pain in both thighs 04/01/2018   Personal history of chemotherapy    Personal history of radiation therapy 12/26/2006   Postural hypotension 06/19/2018   Seasonal allergies 05/14/2012   Stroke (HCC) 2005   Type 2 diabetes mellitus without complications (HCC) 05/20/2016    Past Surgical History Past Surgical History:  Procedure Laterality Date   ABDOMINAL HYSTERECTOMY  1995   BREAST BIOPSY  06/10/2006   malignant   BREAST LUMPECTOMY Left 11/25/2006  malignant   COLONOSCOPY N/A 02/05/2013   Procedure: COLONOSCOPY;  Surgeon: Theda Belfast, MD;  Location: [facility];  Service: Endoscopy;  Laterality: N/A;    Family History family history includes Breast cancer in her mother and sister; Cancer in some other family members; Diabetes in an other family member; Heart disease in her brother and father; Hypertension in an other family member; Stroke in some other family members; Sudden death in an other family member.  Social History Social History   Socioeconomic History   Marital status: Divorced    Spouse name: Not on file   Number of children: 3   Years of education: 11   Highest education level: Not on file  Occupational History    Employer: UNEMPLOYED  Tobacco Use   Smoking status: Every Day    Current packs/day: 0.25    Average packs/day: 0.3 packs/day for 50.0 years (12.5 ttl pk-yrs)    Types: Cigarettes    Passive exposure: Current    Smokeless tobacco: Never  Vaping Use   Vaping status: Never Used  Substance and Sexual Activity   Alcohol use: Yes    Comment: Once a month.    Drug use: No   Sexual activity: Not Currently  Other Topics Concern   Not on file  Social History Narrative   Family/Social Information   patient lives alone . Patient enjoys reading, listening to music and watching TV . Primary family support persons is son [name].    Transportation to appointments provided by daughter and granddaughter.   Receives the following community support services Social Security , food and nutritions benefits.    Strengths: Manufacturing systems engineer, Special hobby/interest, Supportive family/friends   primary caregiver? son.  Completed Caregiver stress assessment with a score of 18 indication of little to no stress.      Sammuel Hines, LCSW   Clinical Social Worker      Social Drivers of Health   Financial Resource Strain: Low Risk  (09/19/2022)   Overall Financial Resource Strain (CARDIA)    Difficulty of Paying Living Expenses: Not hard at all  Food Insecurity: No Food Insecurity (09/19/2022)   Hunger Vital Sign    Worried About Running Out of Food in the Last Year: Never true    Ran Out of Food in the Last Year: Never true  Transportation Needs: No Transportation Needs (09/19/2022)   PRAPARE - Administrator, Civil Service (Medical): No    Lack of Transportation (Non-Medical): No  Physical Activity: Inactive (09/19/2022)   Exercise Vital Sign    Days of Exercise per Week: 0 days    Minutes of Exercise per Session: 0 min  Stress: No Stress Concern Present (09/19/2022)   [employer] of Occupational Health - Occupational Stress Questionnaire    Feeling of Stress : Not at all  Social Connections: Socially Isolated (09/19/2022)   Social Connection and Isolation Panel [NHANES]    Frequency of Communication with Friends and Family: More than three times a week    Frequency of Social Gatherings with  Friends and Family: Once a week    Attends Religious Services: Never    Database administrator or Organizations: No    Attends Banker Meetings: Never    Marital Status: Widowed  Intimate Partner Violence: Not At Risk (09/19/2022)   Humiliation, Afraid, Rape, and Kick questionnaire    Fear of Current or Ex-Partner: No    Emotionally Abused: No    Physically Abused: No  Sexually Abused: No    Laboratory Investigations Lab Results  Component Value Date   TSH 0.526 09/22/2019   TSH 0.74 05/18/2016   TSH 1.612 02/03/2013     No results found for: "TSI"   No components found for: "TRAB"   Lab Results  Component Value Date   CHOL 118 09/25/2021   Lab Results  Component Value Date   HDL 45 09/25/2021   Lab Results  Component Value Date   LDLCALC 54 09/25/2021   Lab Results  Component Value Date   TRIG 98 09/25/2021   Lab Results  Component Value Date   CHOLHDL 2.6 09/25/2021   Lab Results  Component Value Date   CREATININE 0.79 10/05/2021   Lab Results  Component Value Date   GFR 90.37 07/02/2012      Component Value Date/Time   NA 142 10/05/2021 1042   NA 141 03/26/2016 1308   K 4.4 10/05/2021 1042   K 3.5 03/26/2016 1308   CL 104 10/05/2021 1042   CL 106 02/25/2012 1005   CO2 25 10/05/2021 1042   CO2 30 (H) 03/26/2016 1308   GLUCOSE 99 10/05/2021 1042   GLUCOSE 104 03/26/2016 1308   GLUCOSE 96 02/25/2012 1005   BUN 11 10/05/2021 1042   BUN 13.5 03/26/2016 1308   CREATININE 0.79 10/05/2021 1042   CREATININE 1.2 (H) 03/26/2016 1308   CALCIUM 10.0 10/05/2021 1042   CALCIUM 10.3 03/26/2016 1308   PROT 7.0 09/30/2020 0933   PROT 7.3 03/26/2016 1308   ALBUMIN 4.3 09/30/2020 0933   ALBUMIN 3.5 03/26/2016 1308   AST 19 09/30/2020 0933   AST 16 03/26/2016 1308   ALT 8 09/30/2020 0933   ALT 10 03/26/2016 1308   ALKPHOS 112 09/30/2020 0933   ALKPHOS 86 03/26/2016 1308   BILITOT 0.2 09/30/2020 0933   BILITOT 0.43 03/26/2016 1308   GFRNONAA  62 10/23/2019 1134   GFRNONAA 72 09/02/2015 1450   GFRAA 71 10/23/2019 1134   GFRAA 83 09/02/2015 1450      Latest Ref Rng & Units 10/05/2021   10:42 AM 09/25/2021   11:26 AM 09/30/2020    9:33 AM  BMP  Glucose 70 - 99 mg/dL 99  237  81   BUN 8 - 27 mg/dL 11  15  11    Creatinine 0.57 - 1.00 mg/dL 6.28  3.15  1.76   BUN/Creat Ratio 12 - 28 14  14  14    Sodium 134 - 144 mmol/L 142  145  143   Potassium 3.5 - 5.2 mmol/L 4.4  4.2  3.7   Chloride 96 - 106 mmol/L 104  105  102   CO2 20 - 29 mmol/L 25  27  25    Calcium 8.7 - 10.3 mg/dL 16.0  9.4  73.7        Component Value Date/Time   WBC 7.1 10/01/2018 1124   WBC 6.1 03/26/2016 1308   WBC 11.5 (H) 03/21/2016 1230   RBC 4.57 10/01/2018 1124   RBC 4.52 03/26/2016 1308   RBC 4.31 03/21/2016 1230   HGB 13.3 10/01/2018 1124   HGB 13.8 03/26/2016 1308   HCT 41.2 10/01/2018 1124   HCT 42.6 03/26/2016 1308   PLT 242 10/01/2018 1124   MCV 90 10/01/2018 1124   MCV 94.2 03/26/2016 1308   MCH 29.1 10/01/2018 1124   MCH 30.6 03/26/2016 1308   MCH 31.1 03/21/2016 1230   MCHC 32.3 10/01/2018 1124   MCHC 32.4 03/26/2016 1308  MCHC 32.7 03/21/2016 1230   RDW 14.4 10/01/2018 1124   RDW 14.9 (H) 03/26/2016 1308   LYMPHSABS 1.8 03/26/2016 1308   MONOABS 0.6 03/26/2016 1308   EOSABS 0.2 03/26/2016 1308   BASOSABS 0.1 03/26/2016 1308      Parts of this note may have been dictated using voice recognition software. There may be variances in spelling and vocabulary which are unintentional. Not all errors are proofread. Please notify the Thereasa Parkin if any discrepancies are noted or if the meaning of any statement is not clear.

## 2023-07-17 LAB — THYROID STIMULATING IMMUNOGLOBULIN: TSI: <89 %{baseline} (ref <140)

## 2023-07-17 LAB — TSH: TSH: 0.04 m[IU]/L — ABNORMAL LOW (ref 0.40–4.50)

## 2023-07-17 LAB — T4, FREE: Free T4: 1.2 ng/dL (ref 0.8–1.8)

## 2023-07-17 LAB — T3, FREE: T3, Free: 3.8 pg/mL (ref 2.3–4.2)

## 2023-08-14 NOTE — Progress Notes (Deleted)
 Office Visit Note  Patient: Wendy Heath             Date of Birth: January 04, 1951           MRN: [id number]             PCP: Lavada Porteous, DO Referring: Lavada Porteous, DO Visit Date: 08/26/2023   Subjective:  No chief complaint on file.   History of Present Illness: Wendy Heath is a 73 y.o. female here for follow up ***   Previous HPI    No Rheumatology ROS completed.   PMFS History:  Patient Active Problem List   Diagnosis Date Noted   Need for malaria prophylaxis 04/12/2022   Encounter for counseling for travel 04/12/2022   Non-seasonal allergic rhinitis 01/15/2022   Preoperative examination 11/30/2021   Tobacco dependence 11/30/2021   Primary osteoarthritis of right knee 10/12/2021   Primary osteoarthritis of left knee 10/12/2021   OA (osteoarthritis) of knee 09/26/2021   Lipoma of right forearm 12/15/2019   Impingement syndrome of shoulder, right 10/24/2019   Sleep difficulties 06/01/2019   Mild cognitive impairment 05/28/2019   History of fall 05/28/2019   Type 2 diabetes mellitus without complications (HCC) 05/20/2016   Seasonal allergies 05/14/2012   Blind right eye 07/04/2011   OBESITY 12/28/2008   Constipation 12/28/2008   Primary localized osteoarthritis of both knees 12/28/2008   Hyperlipidemia associated with type 2 diabetes mellitus (HCC) 08/24/2006   Hypertension associated with diabetes (HCC) 08/24/2006   GERD 08/24/2006   History of cardiovascular disorder 08/24/2006    Past Medical History:  Diagnosis Date   Abnormal mammogram of left breast 05/28/2019   12/2018 Dg MM: Diagnostic left mammogram is recommended in 6 months to confirm stability of Probably benign calcifications at the lumpectomy site in the left breast without definite appreciable change compared to multiple priors.     Blind right eye 07/04/2011   Since stroke 2003   CEREBROVASCULAR ACCIDENT, HX OF 08/24/2006   Chronic pain of both knees 12/28/2008    Qualifier: Diagnosis of  By: Gaylyn Keas FNP, [place]     Diverticulitis    Dizziness 05/16/2018   DOMESTIC ABUSE, HX OF 08/24/2006   Qualifier: Diagnosis of  By: Katheleen Palmer MD, [place]     GERD 08/24/2006   History of left breast cancer 03/25/2015   HYPERLIPIDEMIA 08/24/2006   Hyperlipidemia associated with type 2 diabetes mellitus (HCC) 08/24/2006   Qualifier: Diagnosis of  By: Katheleen Palmer MD, [place]     HYPERTENSION 08/24/2006   Hypertension associated with diabetes (HCC) 08/24/2006   Qualifier: Diagnosis of  By: Katheleen Palmer MD, [place]     Impaired glucose tolerance 07/04/2011   Malignant neoplasm of breast (female), unspecified site 03/13/2011   Nodule of skin of right hand 12/15/2019   OBESITY 12/28/2008   Qualifier: Diagnosis of  By: Gaylyn Keas FNP, [place]     Pain in both thighs 04/01/2018   Personal history of chemotherapy    Personal history of radiation therapy 12/26/2006   Postural hypotension 06/19/2018   Seasonal allergies 05/14/2012   Stroke (HCC) 2005   Type 2 diabetes mellitus without complications (HCC) 05/20/2016    Family History  Problem Relation Age of Onset   Stroke Other    Cancer Other        breast cancer   Cancer Other        breast cancer   Stroke Other    Hypertension Other    Diabetes Other    Sudden death  Other    Breast cancer Mother    Heart disease Father    Breast cancer Sister    Heart disease Brother    Past Surgical History:  Procedure Laterality Date   ABDOMINAL HYSTERECTOMY  1995   BREAST BIOPSY  06/10/2006   malignant   BREAST LUMPECTOMY Left 11/25/2006   malignant   COLONOSCOPY N/A 02/05/2013   Procedure: COLONOSCOPY;  Surgeon: Almeda Aris, MD;  Location: [facility];  Service: Endoscopy;  Laterality: N/A;   Social History   Social History Narrative   Family/Social Information   patient lives alone . Patient enjoys reading, listening to music and watching TV . Primary family support persons is son Vanna Genta.    Transportation to appointments provided  by daughter and granddaughter.   Receives the following community support services Social Security , food and nutritions benefits.    Strengths: Manufacturing systems engineer, Special hobby/interest, Supportive family/friends   primary caregiver? son.  Completed Caregiver stress assessment with a score of 18 indication of little to no stress.      Duncan Gibson, LCSW   Clinical Social Worker      Immunization History  Administered Date(s) Administered   Fluad Quad(high Dose 65+) 01/05/2020   Influenza Split 01/02/2012   Influenza Whole 02/04/2006, 02/10/2007, 01/28/2008   Influenza, High Dose Seasonal PF 01/30/2017, 01/30/2018, 02/13/2019   Influenza,inj,Quad PF,6+ Mos 02/04/2013, 05/18/2016   Influenza-Unspecified 02/10/2014, 01/21/2017, 01/08/2022   PFIZER Comirnaty(Gray Top)Covid-19 Tri-Sucrose Vaccine 05/30/2019, 06/20/2019, 03/01/2020, 09/30/2020   Pneumococcal Conjugate-13 05/18/2016   Pneumococcal Polysaccharide-23 11/21/2004, 07/22/2017   Td 11/21/2004   Tdap 04/06/2019   Zoster Recombinant(Shingrix ) 04/06/2019, 09/10/2019   Zoster, Live 07/02/2012     Objective: Vital Signs: There were no vitals taken for this visit.   Physical Exam   Musculoskeletal Exam: ***  CDAI Exam: CDAI Score: -- Patient Global: --; Provider Global: -- Swollen: --; Tender: -- Joint Exam 08/26/2023   No joint exam has been documented for this visit   There is currently no information documented on the homunculus. Go to the Rheumatology activity and complete the homunculus joint exam.  Investigation: No additional findings.  Imaging: No results found.  Recent Labs: Lab Results  Component Value Date   WBC 7.1 10/01/2018   HGB 13.3 10/01/2018   PLT 242 10/01/2018   NA 142 10/05/2021   K 4.4 10/05/2021   CL 104 10/05/2021   CO2 25 10/05/2021   GLUCOSE 99 10/05/2021   BUN 11 10/05/2021   CREATININE 0.79 10/05/2021   BILITOT 0.2 09/30/2020   ALKPHOS 112 09/30/2020   AST 19 09/30/2020    ALT 8 09/30/2020   PROT 7.0 09/30/2020   ALBUMIN 4.3 09/30/2020   CALCIUM  10.0 10/05/2021   GFRAA 71 10/23/2019    Speciality Comments: No specialty comments available.  Procedures:  No procedures performed Allergies: Patient has no known allergies.   Assessment / Plan:     Visit Diagnoses: No diagnosis found.  ***  Orders: No orders of the defined types were placed in this encounter.  No orders of the defined types were placed in this encounter.    Follow-Up Instructions: No follow-ups on file.   Glena Landau, RT  Note - This record has been created using [employer].  Chart creation errors have been sought, but may not always  have been located. Such creation errors do not reflect on  the standard of medical care.

## 2023-08-23 ENCOUNTER — Other Ambulatory Visit

## 2023-08-25 NOTE — Progress Notes (Signed)
 Office Visit Note  Patient: Wendy Heath             Date of Birth: 07-15-1950           MRN: [id number]             PCP: Lavada Porteous, DO Referring: Lavada Porteous, DO Visit Date: 08/26/2023   Subjective:  Pain of the Right Knee and Follow-up    Discussed the use of AI scribe software for clinical note transcription with the patient, who gave verbal consent to proceed.  History of Present Illness   Wendy Heath is a 73 year old female with osteoarthritis who presents with worsening knee pain.  She has been experiencing worsening knee pain since February, primarily in her right knee, which is affected by osteoarthritis. A steroid injection last year provided temporary relief. Recently, she has been using wellness patches for pain management, which help when walking, but she reports severe pain in the front of the knee, especially in the mornings. The pain persists even when sitting, and she describes it as feeling like 'the whole bone' is hurting.  There is no swelling in the knee, but significant stiffness and pain are present. She has not engaged in physical therapy and has not tried gel injections yet. She has been prescribed tramadol  for pain, which she has taken, but it is not a long-term solution. Topical treatments like Voltaren  have been tried without significant relief.  Her knees were previously x-rayed in 2023, showing bone-on-bone arthritis. She has a history of a lipoma and a cyst, which were identified during previous evaluations. She is not currently taking any medications other than the patches for her knee pain.  No new swelling in the knee but persistent pain and stiffness. Pain occurs both while walking and at rest.       Previous HPI 05/29/23 Wendy Heath is a 73 year old female with osteoarthritis who presents with severe right knee pain. She was referred for evaluation of her right knee pain, particularly to assess for any  inflammatory problems contributing to her symptoms.   She experiences severe pain in her right knee, which has been ongoing for a significant period and has recently started radiating down into her shin over the past two weeks. The pain is described as 'killing me' and disrupts her sleep. She has been receiving steroid or cortisone injections for the past couple of years, which provide temporary relief, but the last injection over three months ago was less effective.   She is currently taking gabapentin  and Tylenol  for pain management, but finds them ineffective. Gabapentin  causes drowsiness. She does not use a knee brace regularly but relies on a cane for mobility, especially on stairs. She wants to return to the gym but is limited by her knee pain.   No history of inflammatory conditions such as rheumatoid arthritis and no family history of arthritis. 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   She has a long-standing swelling or nodule on her hand, present for years and occasionally painful. It was previously evaluated, and she recalls a cyst removal surgery before the lump appeared. No significant swelling in her right knee.    Review of Systems  Constitutional:  Positive for fatigue.  HENT:  Negative for mouth sores and mouth dryness.   Eyes:  Negative for dryness.  Respiratory:  Negative for shortness  of breath.   Cardiovascular:  Negative for chest pain and palpitations.  Gastrointestinal:  Positive for constipation. Negative for blood in stool and diarrhea.  Endocrine: Positive for increased urination.  Genitourinary:  Negative for involuntary urination.  Musculoskeletal:  Positive for joint pain, joint pain, myalgias and myalgias. Negative for gait problem, joint swelling, muscle weakness, morning stiffness and muscle tenderness.  Skin:  Negative for color change, rash, hair loss and sensitivity to  sunlight.  Allergic/Immunologic: Negative for susceptible to infections.  Neurological:  Positive for headaches. Negative for dizziness.  Hematological:  Negative for swollen glands.  Psychiatric/Behavioral:  Negative for depressed mood and sleep disturbance. The patient is not nervous/anxious.     PMFS History:  Patient Active Problem List   Diagnosis Date Noted   Need for malaria prophylaxis 04/12/2022   Encounter for counseling for travel 04/12/2022   Non-seasonal allergic rhinitis 01/15/2022   Preoperative examination 11/30/2021   Tobacco dependence 11/30/2021   Primary osteoarthritis of right knee 10/12/2021   Primary osteoarthritis of left knee 10/12/2021   OA (osteoarthritis) of knee 09/26/2021   Lipoma of right forearm 12/15/2019   Impingement syndrome of shoulder, right 10/24/2019   Sleep difficulties 06/01/2019   Mild cognitive impairment 05/28/2019   History of fall 05/28/2019   Type 2 diabetes mellitus without complications (HCC) 05/20/2016   Seasonal allergies 05/14/2012   Blind right eye 07/04/2011   OBESITY 12/28/2008   Constipation 12/28/2008   Primary localized osteoarthritis of both knees 12/28/2008   Hyperlipidemia associated with type 2 diabetes mellitus (HCC) 08/24/2006   Hypertension associated with diabetes (HCC) 08/24/2006   GERD 08/24/2006   History of cardiovascular disorder 08/24/2006    Past Medical History:  Diagnosis Date   Abnormal mammogram of left breast 05/28/2019   12/2018 Dg MM: Diagnostic left mammogram is recommended in 6 months to confirm stability of Probably benign calcifications at the lumpectomy site in the left breast without definite appreciable change compared to multiple priors.     Blind right eye 07/04/2011   Since stroke 2003   CEREBROVASCULAR ACCIDENT, HX OF 08/24/2006   Chronic pain of both knees 12/28/2008   Qualifier: Diagnosis of  By: Gaylyn Keas FNP, [place]     Diverticulitis    Dizziness 05/16/2018   DOMESTIC ABUSE, HX OF  08/24/2006   Qualifier: Diagnosis of  By: Katheleen Palmer MD, [place]     GERD 08/24/2006   History of left breast cancer 03/25/2015   HYPERLIPIDEMIA 08/24/2006   Hyperlipidemia associated with type 2 diabetes mellitus (HCC) 08/24/2006   Qualifier: Diagnosis of  By: Katheleen Palmer MD, [place]     HYPERTENSION 08/24/2006   Hypertension associated with diabetes (HCC) 08/24/2006   Qualifier: Diagnosis of  By: Katheleen Palmer MD, [place]     Impaired glucose tolerance 07/04/2011   Malignant neoplasm of breast (female), unspecified site 03/13/2011   Nodule of skin of right hand 12/15/2019   OBESITY 12/28/2008   Qualifier: Diagnosis of  By: Gaylyn Keas FNP, [place]     Pain in both thighs 04/01/2018   Personal history of chemotherapy    Personal history of radiation therapy 12/26/2006   Postural hypotension 06/19/2018   Seasonal allergies 05/14/2012   Stroke (HCC) 2005   Type 2 diabetes mellitus without complications (HCC) 05/20/2016    Family History  Problem Relation Age of Onset   Stroke Other    Cancer Other        breast cancer   Cancer Other        breast cancer  Stroke Other    Hypertension Other    Diabetes Other    Sudden death Other    Breast cancer Mother    Heart disease Father    Breast cancer Sister    Heart disease Brother    Past Surgical History:  Procedure Laterality Date   ABDOMINAL HYSTERECTOMY  1995   BREAST BIOPSY  06/10/2006   malignant   BREAST LUMPECTOMY Left 11/25/2006   malignant   COLONOSCOPY N/A 02/05/2013   Procedure: COLONOSCOPY;  Surgeon: Almeda Aris, MD;  Location: [facility];  Service: Endoscopy;  Laterality: N/A;   Social History   Social History Narrative   Family/Social Information   patient lives alone . Patient enjoys reading, listening to music and watching TV . Primary family support persons is son Vanna Genta.    Transportation to appointments provided by daughter and granddaughter.   Receives the following community support services Social Security , food and  nutritions benefits.    Strengths: Manufacturing systems engineer, Special hobby/interest, Supportive family/friends   primary caregiver? son.  Completed Caregiver stress assessment with a score of 18 indication of little to no stress.      Duncan Gibson, LCSW   Clinical Social Worker      Immunization History  Administered Date(s) Administered   Fluad Quad(high Dose 65+) 01/05/2020   Influenza Split 01/02/2012   Influenza Whole 02/04/2006, 02/10/2007, 01/28/2008   Influenza, High Dose Seasonal PF 01/30/2017, 01/30/2018, 02/13/2019   Influenza,inj,Quad PF,6+ Mos 02/04/2013, 05/18/2016   Influenza-Unspecified 02/10/2014, 01/21/2017, 01/08/2022   PFIZER Comirnaty(Gray Top)Covid-19 Tri-Sucrose Vaccine 05/30/2019, 06/20/2019, 03/01/2020, 09/30/2020   Pneumococcal Conjugate-13 05/18/2016   Pneumococcal Polysaccharide-23 11/21/2004, 07/22/2017   Td 11/21/2004   Tdap 04/06/2019   Zoster Recombinant(Shingrix ) 04/06/2019, 09/10/2019   Zoster, Live 07/02/2012     Objective: Vital Signs: BP 117/75 (BP Location: Left Arm, Patient Position: Sitting)   Pulse 80   Resp 16   Ht 5\' 3"  (1.6 m)   Wt 177 lb 6.4 oz (80.5 kg)   BMI 31.42 kg/m    Physical Exam Cardiovascular:     Rate and Rhythm: Normal rate and regular rhythm.  Pulmonary:     Effort: Pulmonary effort is normal.     Breath sounds: Normal breath sounds.  Skin:    General: Skin is warm and dry.  Neurological:     Mental Status: She is alert.  Psychiatric:        Mood and Affect: Mood normal.      Musculoskeletal Exam:  Shoulders full ROM no tenderness or swelling Elbows full ROM no tenderness or swelling Wrists full ROM no tenderness or swelling Fingers full ROM no tenderness or swelling No paraspinal tenderness to palpation over upper and lower back Hip normal internal and external rotation without pain, no tenderness to lateral hip palpation Knee range of motion slightly restricted in extension flexion is limited just past 90  degrees, right knee medial and lateral joint line tenderness to pressure, no palpable effusion    Investigation: No additional findings.  Imaging: No results found.  Recent Labs: Lab Results  Component Value Date   WBC 7.1 10/01/2018   HGB 13.3 10/01/2018   PLT 242 10/01/2018   NA 142 10/05/2021   K 4.4 10/05/2021   CL 104 10/05/2021   CO2 25 10/05/2021   GLUCOSE 99 10/05/2021   BUN 11 10/05/2021   CREATININE 0.79 10/05/2021   BILITOT 0.2 09/30/2020   ALKPHOS 112 09/30/2020   AST 19 09/30/2020   ALT 8  09/30/2020   PROT 7.0 09/30/2020   ALBUMIN 4.3 09/30/2020   CALCIUM  10.0 10/05/2021   GFRAA 71 10/23/2019    Speciality Comments: No specialty comments available.  Procedures:  No procedures performed Allergies: Patient has no known allergies.   Assessment / Plan:     Visit Diagnoses: Primary osteoarthritis of right knee - Plan: predniSONE  (DELTASONE ) 5 MG tablet Osteoarthritis of right knee Chronic osteoarthritis with significant pain and stiffness. Previous steroid injections provided limited relief. Discussed viscosupplementation as a non-surgical option, requiring insurance approval and documentation of previous ineffective treatments. - Prescribe short course of oral steroids with tapering dose. - Initiate prior authorization for viscosupplementation. - Schedule viscosupplementation upon insurance approval. - Consider additional knee x-ray if required by insurance.  Lipoma of hand Benign lipoma, well-demarcated, not causing functional impairment. Surgical removal not recommended due to risk to surrounding structures.  Cyst of hand Small superficial cyst, likely fluid-filled, not causing significant issues.       Orders: No orders of the defined types were placed in this encounter.  Meds ordered this encounter  Medications   predniSONE  (DELTASONE ) 5 MG tablet    Sig: Take 6 tablets (30 mg total) by mouth daily with breakfast for 1 day, THEN 5 tablets (25  mg total) daily with breakfast for 1 day, THEN 4 tablets (20 mg total) daily with breakfast for 1 day, THEN 3 tablets (15 mg total) daily with breakfast for 1 day, THEN 2 tablets (10 mg total) daily with breakfast for 1 day, THEN 1 tablet (5 mg total) daily with breakfast for 1 day.    Dispense:  21 tablet    Refill:  0     Follow-Up Instructions: No follow-ups on file.   Matt Song, MD  Note - This record has been created using [employer].  Chart creation errors have been sought, but may not always  have been located. Such creation errors do not reflect on  the standard of medical care.

## 2023-08-26 ENCOUNTER — Encounter: Payer: Self-pay | Admitting: Internal Medicine

## 2023-08-26 ENCOUNTER — Telehealth: Payer: Self-pay | Admitting: Internal Medicine

## 2023-08-26 ENCOUNTER — Ambulatory Visit: Payer: 59 | Attending: Internal Medicine | Admitting: Internal Medicine

## 2023-08-26 VITALS — BP 117/75 | HR 80 | Resp 16 | Ht 63.0 in | Wt 177.4 lb

## 2023-08-26 DIAGNOSIS — M1711 Unilateral primary osteoarthritis, right knee: Secondary | ICD-10-CM

## 2023-08-26 DIAGNOSIS — M130 Polyarthritis, unspecified: Secondary | ICD-10-CM

## 2023-08-26 MED ORDER — PREDNISONE 5 MG PO TABS: ORAL_TABLET | ORAL | 0 refills | Status: DC

## 2023-08-26 MED ORDER — PREDNISONE 5 MG PO TABS: ORAL_TABLET | ORAL | 0 refills | Status: AC

## 2023-08-26 NOTE — Telephone Encounter (Signed)
 Pt would like her medication (prednisone) sent to [facility] [address] [place], [place], [place] [address]

## 2023-08-26 NOTE — Telephone Encounter (Signed)
 Patient contacted the office and would like her prednisone sent to [facility] on [address]. Original prescription was sent to [facility]. Will resend prescription to the [facility]. Contacted [employer] and spoke to [employer]. Angie cancelled the patient's prescription.

## 2023-08-27 LAB — T4, FREE: Free T4: 0.9 ng/dL (ref 0.8–1.8)

## 2023-08-27 LAB — THYROID STIMULATING IMMUNOGLOBULIN: TSI: <89 %{baseline} (ref <140)

## 2023-08-27 LAB — TRAB (TSH RECEPTOR BINDING ANTIBODY): TRAB: <1 IU/L (ref <=2.00)

## 2023-08-27 LAB — T3, FREE: T3, Free: 3.2 pg/mL (ref 2.3–4.2)

## 2023-08-27 LAB — TSH: TSH: 2.28 m[IU]/L (ref 0.40–4.50)

## 2023-08-28 ENCOUNTER — Encounter: Payer: Self-pay | Admitting: "Endocrinology

## 2023-08-28 ENCOUNTER — Ambulatory Visit (INDEPENDENT_AMBULATORY_CARE_PROVIDER_SITE_OTHER): Admitting: "Endocrinology

## 2023-08-28 VITALS — BP 122/80 | Ht 63.0 in | Wt 180.0 lb

## 2023-08-28 DIAGNOSIS — E0590 Thyrotoxicosis, unspecified without thyrotoxic crisis or storm: Secondary | ICD-10-CM

## 2023-08-28 MED ORDER — METHIMAZOLE 5 MG PO TABS: 2.5000 mg | ORAL_TABLET | Freq: Every day | ORAL | 0 refills | Status: DC

## 2023-08-28 NOTE — Progress Notes (Signed)
 Outpatient Endocrinology Note Wendy Newcomer, MD  08/28/23   Wendy Heath 1951/01/10 [id number]  Referring Provider: Lavada Porteous, DO Primary Care Provider: Lavada Porteous, DO Subjective  No chief complaint on file.   Assessment & Plan  Diagnoses and all orders for this visit:  Subclinical hyperthyroidism -     TSH -     T3, free -     T4, free -     CBC with Differential/Platelet  Other orders -     methimazole  (TAPAZOLE ) 5 MG tablet; Take 0.5 tablets (2.5 mg total) by mouth daily.     Wendy Heath is currently taking methimazole  5 mg every day. Started methimazole  on 07/15/23.   Patient is biochemically euthyroid on last labs.  Discussed the etiology for hyperthyroidism. Educated on thyroid  axis.  Recommend the following: Decrease methimazole  to 2.5 gm every day. (Pt gets headaches on 5 mg every day). Untreated hyperthyroidism including atrial fibrillation, heart failure and osteoporosis. Side effects of Methimazole  include but not limited to allergic reaction, rash, bone marrow suppression, liver dysfunction and teratogenic potential  On methimazole , if you notice any symptoms of worsening fatigue, fever with sore throat, loss of appetite, yellowing of eyes, dark urine, joint pains, sores in the mouth, itchy rash, light colored stools or abdominal pain, please stop the medication and call us  immediately as this can be a serious side effect of the medication.   I have reviewed current medications, nurse's notes, allergies, vital signs, past medical and surgical history, family medical history, and social history for this encounter. Counseled patient on symptoms, examination findings, lab findings, imaging results, treatment decisions and monitoring and prognosis. The patient understood the recommendations and agrees with the treatment plan. All questions regarding treatment plan were fully answered.   Return in about 3 months (around  11/28/2023) for visit + labs before next visit.   Wendy Newcomer, MD  08/28/23   I have reviewed current medications, nurse's notes, allergies, vital signs, past medical and surgical history, family medical history, and social history for this encounter. Counseled patient on symptoms, examination findings, lab findings, imaging results, treatment decisions and monitoring and prognosis. The patient understood the recommendations and agrees with the treatment plan. All questions regarding treatment plan were fully answered.   History of Present Illness Wendy Heath is a 73 y.o. year old female who presents to our clinic with subclinical hyperthyroidism diagnosed in 03/2023.    Never been on thyroid  medication   Symptoms suggestive of HYPOTHYROIDISM:  fatigue Yes weight gain No cold intolerance  No constipation  No  Symptoms suggestive of HYPERTHYROIDISM:  weight loss  Yes heat intolerance No hyperdefecation  No palpitations  No  Compressive symptoms:  dysphagia  No dysphonia  No positional dyspnea (especially with simultaneous arms elevation)  No  Smokes  Yes On biotin  No Personal history of head/neck surgery/irradiation  No  Reviewed records:  04/11/23 TSH 0.06, FT4 1.1 05/03/23: TSH 0.03, FT4 1.3, T3 27% TRAb <1  Adverse Drug Effects from Methimazole  (MMI): rash No fever No throat pain No arthritis No mouth ulcers No jaundice No loss of appetite No lymphadenopathy No  Physical Exam  BP 122/80   Ht 5\' 3"  (1.6 m)   Wt 180 lb (81.6 kg)   BMI 31.89 kg/m  Constitutional: well developed, well nourished Head: normocephalic, atraumatic, no exophthalmos Eyes: sclera anicteric, no redness Neck: no thyromegaly, no thyroid  tenderness; no nodules palpated Lungs: normal respiratory effort Neurology: alert and  oriented, no fine hand tremor Skin: dry, no appreciable rashes Musculoskeletal: no appreciable defects Psychiatric: normal mood and  affect  Allergies No Known Allergies  Current Medications Patient's Medications  New Prescriptions   No medications on file  Previous Medications   AMLODIPINE  (NORVASC ) 10 MG TABLET    Take 1 tablet (10 mg total) by mouth at bedtime. NEEDS APPT FOR FUTURE REFILLS.   AMOXICILLIN -CLAVULANATE (AUGMENTIN ) 875-125 MG TABLET    Take 1 tablet by mouth 2 (two) times daily.   ASPIRIN 81 MG TABLET    Take 81 mg by mouth daily.    BENZONATATE  (TESSALON ) 200 MG CAPSULE    Take 1 capsule (200 mg total) by mouth 2 (two) times daily as needed for cough.   CETIRIZINE  (ZYRTEC ) 10 MG TABLET    Take 1 tablet (10 mg total) by mouth daily.   CHOLECALCIFEROL (VITAMIN D ) 400 UNITS TABS    Take 1,000 Units by mouth daily.    DICLOFENAC  SODIUM (VOLTAREN ) 1 % GEL    Apply 4 g topically 4 (four) times daily. Apply to both knees 4 times a day   FAMOTIDINE  (PEPCID ) 20 MG TABLET    TAKE 1 TABLET BY MOUTH DAILY   FLUTICASONE  (FLONASE ) 50 MCG/ACT NASAL SPRAY    USE 2 SPRAYS IN BOTH  NOSTRILS DAILY   GABAPENTIN  (NEURONTIN ) 300 MG CAPSULE    TAKE 1 CAPSULE BY MOUTH TWICE  DAILY   LISINOPRIL  (ZESTRIL ) 10 MG TABLET    TAKE 1 TABLET BY MOUTH AT  BEDTIME   MAGIC MOUTHWASH (LIDOCAINE , DIPHENHYDRAMINE , ALUM & MAG HYDROXIDE) SUSPENSION    Swish and spit 5 mLs 3 (three) times daily as needed for mouth pain.   PREDNISONE (DELTASONE) 5 MG TABLET    Take 6 tablets (30 mg total) by mouth daily with breakfast for 1 day, THEN 5 tablets (25 mg total) daily with breakfast for 1 day, THEN 4 tablets (20 mg total) daily with breakfast for 1 day, THEN 3 tablets (15 mg total) daily with breakfast for 1 day, THEN 2 tablets (10 mg total) daily with breakfast for 1 day, THEN 1 tablet (5 mg total) daily with breakfast for 1 day.   ROSUVASTATIN  (CRESTOR ) 10 MG TABLET    TAKE 1 TABLET BY MOUTH DAILY  Modified Medications   Modified Medication Previous Medication   METHIMAZOLE  (TAPAZOLE ) 5 MG TABLET methimazole  (TAPAZOLE ) 5 MG tablet      Take 0.5  tablets (2.5 mg total) by mouth daily.    Take 1 tablet (5 mg total) by mouth daily.  Discontinued Medications   No medications on file    Past Medical History Past Medical History:  Diagnosis Date   Abnormal mammogram of left breast 05/28/2019   12/2018 [facility]: Diagnostic left mammogram is recommended in 6 months to confirm stability of Probably benign calcifications at the lumpectomy site in the left breast without definite appreciable change compared to multiple priors.     Blind right eye 07/04/2011   Since stroke 2003   CEREBROVASCULAR ACCIDENT, HX OF 08/24/2006   Chronic pain of both knees 12/28/2008   Qualifier: Diagnosis of  By: Gaylyn Keas FNP, [place]     Diverticulitis    Dizziness 05/16/2018   DOMESTIC ABUSE, HX OF 08/24/2006   Qualifier: Diagnosis of  By: Katheleen Palmer MD, [place]     GERD 08/24/2006   History of left breast cancer 03/25/2015   HYPERLIPIDEMIA 08/24/2006   Hyperlipidemia associated with type 2 diabetes mellitus (HCC) 08/24/2006  Qualifier: Diagnosis of  By: Katheleen Palmer MD, [place]     HYPERTENSION 08/24/2006   Hypertension associated with diabetes (HCC) 08/24/2006   Qualifier: Diagnosis of  By: Katheleen Palmer MD, [place]     Impaired glucose tolerance 07/04/2011   Malignant neoplasm of breast (female), unspecified site 03/13/2011   Nodule of skin of right hand 12/15/2019   OBESITY 12/28/2008   Qualifier: Diagnosis of  By: Gaylyn Keas FNP, [place]     Pain in both thighs 04/01/2018   Personal history of chemotherapy    Personal history of radiation therapy 12/26/2006   Postural hypotension 06/19/2018   Seasonal allergies 05/14/2012   Stroke (HCC) 2005   Type 2 diabetes mellitus without complications (HCC) 05/20/2016    Past Surgical History Past Surgical History:  Procedure Laterality Date   ABDOMINAL HYSTERECTOMY  1995   BREAST BIOPSY  06/10/2006   malignant   BREAST LUMPECTOMY Left 11/25/2006   malignant   COLONOSCOPY N/A 02/05/2013   Procedure: COLONOSCOPY;  Surgeon: Almeda Aris,  MD;  Location: [facility];  Service: Endoscopy;  Laterality: N/A;    Family History family history includes Breast cancer in her mother and sister; Cancer in some other family members; Diabetes in an other family member; Heart disease in her brother and father; Hypertension in an other family member; Stroke in some other family members; Sudden death in an other family member.  Social History Social History   Socioeconomic History   Marital status: Divorced    Spouse name: Not on file   Number of children: 3   Years of education: 11   Highest education level: Not on file  Occupational History    Employer: UNEMPLOYED  Tobacco Use   Smoking status: Every Day    Current packs/day: 0.25    Average packs/day: 0.3 packs/day for 50.0 years (12.5 ttl pk-yrs)    Types: Cigarettes    Passive exposure: Current   Smokeless tobacco: Never  Vaping Use   Vaping status: Never Used  Substance and Sexual Activity   Alcohol use: Yes    Comment: Once a month.    Drug use: No   Sexual activity: Not Currently  Other Topics Concern   Not on file  Social History Narrative   Family/Social Information   patient lives alone . Patient enjoys reading, listening to music and watching TV . Primary family support persons is son Vanna Genta.    Transportation to appointments provided by daughter and granddaughter.   Receives the following community support services Social Security , food and nutritions benefits.    Strengths: Manufacturing systems engineer, Special hobby/interest, Supportive family/friends   primary caregiver? son.  Completed Caregiver stress assessment with a score of 18 indication of little to no stress.      Duncan Gibson, LCSW   Clinical Social Worker      Social Drivers of Health   Financial Resource Strain: Low Risk  (09/19/2022)   Overall Financial Resource Strain (CARDIA)    Difficulty of Paying Living Expenses: Not hard at all  Food Insecurity: No Food Insecurity (09/19/2022)   Hunger  Vital Sign    Worried About Running Out of Food in the Last Year: Never true    Ran Out of Food in the Last Year: Never true  Transportation Needs: No Transportation Needs (09/19/2022)   PRAPARE - Administrator, Civil Service (Medical): No    Lack of Transportation (Non-Medical): No  Physical Activity: Inactive (09/19/2022)   Exercise Vital Sign    Days  of Exercise per Week: 0 days    Minutes of Exercise per Session: 0 min  Stress: No Stress Concern Present (09/19/2022)   [employer] of Occupational Health - Occupational Stress Questionnaire    Feeling of Stress : Not at all  Social Connections: Socially Isolated (09/19/2022)   Social Connection and Isolation Panel [NHANES]    Frequency of Communication with Friends and Family: More than three times a week    Frequency of Social Gatherings with Friends and Family: Once a week    Attends Religious Services: Never    Database administrator or Organizations: No    Attends Banker Meetings: Never    Marital Status: Widowed  Intimate Partner Violence: Not At Risk (09/19/2022)   Humiliation, Afraid, Rape, and Kick questionnaire    Fear of Current or Ex-Partner: No    Emotionally Abused: No    Physically Abused: No    Sexually Abused: No    Laboratory Investigations Lab Results  Component Value Date   TSH 2.28 08/23/2023   TSH 0.04 (L) 07/15/2023   TSH 0.526 09/22/2019   FREET4 0.9 08/23/2023   FREET4 1.2 07/15/2023     Lab Results  Component Value Date   TSI <89 08/23/2023     No components found for: "TRAB"   Lab Results  Component Value Date   CHOL 118 09/25/2021   Lab Results  Component Value Date   HDL 45 09/25/2021   Lab Results  Component Value Date   LDLCALC 54 09/25/2021   Lab Results  Component Value Date   TRIG 98 09/25/2021   Lab Results  Component Value Date   CHOLHDL 2.6 09/25/2021   Lab Results  Component Value Date   CREATININE 0.79 10/05/2021   Lab Results   Component Value Date   GFR 90.37 07/02/2012      Component Value Date/Time   NA 142 10/05/2021 1042   NA 141 03/26/2016 1308   K 4.4 10/05/2021 1042   K 3.5 03/26/2016 1308   CL 104 10/05/2021 1042   CL 106 02/25/2012 1005   CO2 25 10/05/2021 1042   CO2 30 (H) 03/26/2016 1308   GLUCOSE 99 10/05/2021 1042   GLUCOSE 104 03/26/2016 1308   GLUCOSE 96 02/25/2012 1005   BUN 11 10/05/2021 1042   BUN 13.5 03/26/2016 1308   CREATININE 0.79 10/05/2021 1042   CREATININE 1.2 (H) 03/26/2016 1308   CALCIUM  10.0 10/05/2021 1042   CALCIUM  10.3 03/26/2016 1308   PROT 7.0 09/30/2020 0933   PROT 7.3 03/26/2016 1308   ALBUMIN 4.3 09/30/2020 0933   ALBUMIN 3.5 03/26/2016 1308   AST 19 09/30/2020 0933   AST 16 03/26/2016 1308   ALT 8 09/30/2020 0933   ALT 10 03/26/2016 1308   ALKPHOS 112 09/30/2020 0933   ALKPHOS 86 03/26/2016 1308   BILITOT 0.2 09/30/2020 0933   BILITOT 0.43 03/26/2016 1308   GFRNONAA 62 10/23/2019 1134   GFRNONAA 72 09/02/2015 1450   GFRAA 71 10/23/2019 1134   GFRAA 83 09/02/2015 1450      Latest Ref Rng & Units 10/05/2021   10:42 AM 09/25/2021   11:26 AM 09/30/2020    9:33 AM  BMP  Glucose 70 - 99 mg/dL 99  102  81   BUN 8 - 27 mg/dL 11  15  11    Creatinine 0.57 - 1.00 mg/dL 7.25  3.66  4.40   BUN/Creat Ratio 12 - 28 14  14   14  Sodium 134 - 144 mmol/L 142  145  143   Potassium 3.5 - 5.2 mmol/L 4.4  4.2  3.7   Chloride 96 - 106 mmol/L 104  105  102   CO2 20 - 29 mmol/L 25  27  25    Calcium  8.7 - 10.3 mg/dL 40.9  9.4  81.1        Component Value Date/Time   WBC 7.1 10/01/2018 1124   WBC 6.1 03/26/2016 1308   WBC 11.5 (H) 03/21/2016 1230   RBC 4.57 10/01/2018 1124   RBC 4.52 03/26/2016 1308   RBC 4.31 03/21/2016 1230   HGB 13.3 10/01/2018 1124   HGB 13.8 03/26/2016 1308   HCT 41.2 10/01/2018 1124   HCT 42.6 03/26/2016 1308   PLT 242 10/01/2018 1124   MCV 90 10/01/2018 1124   MCV 94.2 03/26/2016 1308   MCH 29.1 10/01/2018 1124   MCH 30.6 03/26/2016  1308   MCH 31.1 03/21/2016 1230   MCHC 32.3 10/01/2018 1124   MCHC 32.4 03/26/2016 1308   MCHC 32.7 03/21/2016 1230   RDW 14.4 10/01/2018 1124   RDW 14.9 (H) 03/26/2016 1308   LYMPHSABS 1.8 03/26/2016 1308   MONOABS 0.6 03/26/2016 1308   EOSABS 0.2 03/26/2016 1308   BASOSABS 0.1 03/26/2016 1308      Parts of this note may have been dictated using voice recognition software. There may be variances in spelling and vocabulary which are unintentional. Not all errors are proofread. Please notify the [facility] if any discrepancies are noted or if the meaning of any statement is not clear.

## 2023-10-04 ENCOUNTER — Encounter: Payer: Self-pay | Admitting: Internal Medicine

## 2023-10-04 ENCOUNTER — Ambulatory Visit: Attending: Internal Medicine | Admitting: Internal Medicine

## 2023-10-04 ENCOUNTER — Telehealth: Payer: Self-pay | Admitting: Internal Medicine

## 2023-10-04 VITALS — BP 122/78 | HR 70 | Resp 14 | Ht 63.0 in | Wt 177.0 lb

## 2023-10-04 DIAGNOSIS — M25561 Pain in right knee: Secondary | ICD-10-CM

## 2023-10-04 DIAGNOSIS — M1711 Unilateral primary osteoarthritis, right knee: Secondary | ICD-10-CM | POA: Diagnosis not present

## 2023-10-04 MED ORDER — TRAMADOL HCL 50 MG PO TABS: 50.0000 mg | ORAL_TABLET | Freq: Every evening | ORAL | 0 refills | Status: DC | PRN

## 2023-10-04 NOTE — Telephone Encounter (Signed)
 Contacted [facility] and cancelled the prescription of Tramadol  that was sent there.

## 2023-10-04 NOTE — Progress Notes (Signed)
 Office Visit Note  Patient: Wendy Heath             Date of Birth: June 16, 1950           MRN: [id number]             PCP: Howell Lunger, DO Referring: Howell Lunger, DO Visit Date: 10/04/2023   Subjective:  Follow-up (Patient states she is having right knee pain and cannot sleep because the pain is so bad. )   Discussed the use of AI scribe software for clinical note transcription with the patient, who gave verbal consent to proceed.  History of Present Illness   Wendy Heath is a 73 year old female with knee osteoarthritis who presents with worsening right knee pain.  She experiences persistent and worsening pain in her right knee, described as 'killing me', which radiates down into her ankle. The pain is particularly severe at night and upon waking, making it difficult to bear weight on the leg. The pain persists even when not bearing weight, although propping up the leg provides some relief.  She has a history of knee osteoarthritis and has been informed that her knee is 'bone on bone' with no cartilage remaining. She received a steroid injection in February, which provided temporary relief, and was prescribed tramadol  for pain management. The tramadol  was effective for a short period, but she continues to experience significant discomfort. She recalls receiving a gel injection last year, which did not provide long-lasting relief.  She has not yet consulted an orthopedic specialist regarding her knee issues. She desires pain relief to improve her sleep quality, as she is 'so tired' from the ongoing pain.  She has been taking tramadol  as needed for pain, and she experiences constipation, which she attributes to both the medication and her diet, which includes a lot of cheese.     Previous HPI 08/26/23 Wendy Heath is a 73 year old female with osteoarthritis who presents with worsening knee pain.   She has been experiencing worsening knee pain  since February, primarily in her right knee, which is affected by osteoarthritis. A steroid injection last year provided temporary relief. Recently, she has been using wellness patches for pain management, which help when walking, but she reports severe pain in the front of the knee, especially in the mornings. The pain persists even when sitting, and she describes it as feeling like 'the whole bone' is hurting.   There is no swelling in the knee, but significant stiffness and pain are present. She has not engaged in physical therapy and has not tried gel injections yet. She has been prescribed tramadol  for pain, which she has taken, but it is not a long-term solution. Topical treatments like Voltaren  have been tried without significant relief.   Her knees were previously x-rayed in 2023, showing bone-on-bone arthritis. She has a history of a lipoma and a cyst, which were identified during previous evaluations. She is not currently taking any medications other than the patches for her knee pain.   No new swelling in the knee but persistent pain and stiffness. Pain occurs both while walking and at rest.         Previous HPI 05/29/23 Wendy Heath is a 73 year old female with osteoarthritis who presents with severe right knee pain. She was referred for evaluation of her right knee pain, particularly to assess for any inflammatory problems contributing to her symptoms.   She experiences severe pain in her right  knee, which has been ongoing for a significant period and has recently started radiating down into her shin over the past two weeks. The pain is described as 'killing me' and disrupts her sleep. She has been receiving steroid or cortisone injections for the past couple of years, which provide temporary relief, but the last injection over three months ago was less effective.   She is currently taking gabapentin  and Tylenol  for pain management, but finds them ineffective. Gabapentin  causes  drowsiness. She does not use a knee brace regularly but relies on a cane for mobility, especially on stairs. She wants to return to the gym but is limited by her knee pain.   No history of inflammatory conditions such as rheumatoid arthritis and no family history of arthritis. 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   She has a long-standing swelling or nodule on her hand, present for years and occasionally painful. It was previously evaluated, and she recalls a cyst removal surgery before the lump appeared. No significant swelling in her right knee.    Review of Systems  Constitutional:  Negative for fatigue.  HENT:  Negative for mouth sores and mouth dryness.   Eyes:  Positive for dryness.  Respiratory:  Negative for shortness of breath.   Cardiovascular:  Negative for chest pain and palpitations.  Gastrointestinal:  Positive for constipation. Negative for blood in stool and diarrhea.  Endocrine: Negative for increased urination.  Genitourinary:  Negative for involuntary urination.  Musculoskeletal:  Positive for joint pain and joint pain. Negative for gait problem, joint swelling, myalgias, muscle weakness, morning stiffness, muscle tenderness and myalgias.  Skin:  Positive for sensitivity to sunlight. Negative for color change, rash and hair loss.  Allergic/Immunologic: Negative for susceptible to infections.  Neurological:  Negative for dizziness and headaches.  Hematological:  Negative for swollen glands.  Psychiatric/Behavioral:  Negative for depressed mood and sleep disturbance. The patient is not nervous/anxious.     PMFS History:  Patient Active Problem List   Diagnosis Date Noted   Need for malaria prophylaxis 04/12/2022   Encounter for counseling for travel 04/12/2022   Non-seasonal allergic rhinitis 01/15/2022   Preoperative examination 11/30/2021   Tobacco dependence 11/30/2021    Primary osteoarthritis of right knee 10/12/2021   Primary osteoarthritis of left knee 10/12/2021   OA (osteoarthritis) of knee 09/26/2021   Lipoma of right forearm 12/15/2019   Impingement syndrome of shoulder, right 10/24/2019   Sleep difficulties 06/01/2019   Mild cognitive impairment 05/28/2019   History of fall 05/28/2019   Type 2 diabetes mellitus without complications (HCC) 05/20/2016   Seasonal allergies 05/14/2012   Blind right eye 07/04/2011   OBESITY 12/28/2008   Constipation 12/28/2008   Primary localized osteoarthritis of both knees 12/28/2008   Hyperlipidemia associated with type 2 diabetes mellitus (HCC) 08/24/2006   Hypertension associated with diabetes (HCC) 08/24/2006   GERD 08/24/2006   History of cardiovascular disorder 08/24/2006    Past Medical History:  Diagnosis Date   Abnormal mammogram of left breast 05/28/2019   12/2018 Dg MM: Diagnostic left mammogram is recommended in 6 months to confirm stability of Probably benign calcifications at the lumpectomy site in the left breast without definite appreciable change compared to multiple priors.     Blind right eye 07/04/2011   Since stroke 2003   CEREBROVASCULAR ACCIDENT, HX OF 08/24/2006   Chronic pain of both knees 12/28/2008  Qualifier: Diagnosis of  By: Gladis FNP, [place]     Diverticulitis    Dizziness 05/16/2018   DOMESTIC ABUSE, HX OF 08/24/2006   Qualifier: Diagnosis of  By: Loretha MD, [place]     GERD 08/24/2006   History of left breast cancer 03/25/2015   HYPERLIPIDEMIA 08/24/2006   Hyperlipidemia associated with type 2 diabetes mellitus (HCC) 08/24/2006   Qualifier: Diagnosis of  By: Loretha MD, [place]     HYPERTENSION 08/24/2006   Hypertension associated with diabetes (HCC) 08/24/2006   Qualifier: Diagnosis of  By: Loretha MD, [place]     Impaired glucose tolerance 07/04/2011   Malignant neoplasm of breast (female), unspecified site 03/13/2011   Nodule of skin of right hand 12/15/2019   OBESITY 12/28/2008    Qualifier: Diagnosis of  By: Gladis FNP, [place]     Pain in both thighs 04/01/2018   Personal history of chemotherapy    Personal history of radiation therapy 12/26/2006   Postural hypotension 06/19/2018   Seasonal allergies 05/14/2012   Stroke (HCC) 2005   Type 2 diabetes mellitus without complications (HCC) 05/20/2016    Family History  Problem Relation Age of Onset   Stroke Other    Cancer Other        breast cancer   Cancer Other        breast cancer   Stroke Other    Hypertension Other    Diabetes Other    Sudden death Other    Breast cancer Mother    Heart disease Father    Breast cancer Sister    Heart disease Brother    Past Surgical History:  Procedure Laterality Date   ABDOMINAL HYSTERECTOMY  1995   BREAST BIOPSY  06/10/2006   malignant   BREAST LUMPECTOMY Left 11/25/2006   malignant   COLONOSCOPY N/A 02/05/2013   Procedure: COLONOSCOPY;  Surgeon: Belvie JONETTA Just, MD;  Location: [facility];  Service: Endoscopy;  Laterality: N/A;   Social History   Social History Narrative   Family/Social Information   patient lives alone . Patient enjoys reading, listening to music and watching TV . Primary family support persons is son Sergio.    Transportation to appointments provided by daughter and granddaughter.   Receives the following community support services Social Security , food and nutritions benefits.    Strengths: Manufacturing systems engineer, Special hobby/interest, Supportive family/friends   primary caregiver? son.  Completed Caregiver stress assessment with a score of 18 indication of little to no stress.      Barnie Ada, LCSW   Clinical Social Worker      Immunization History  Administered Date(s) Administered   Fluad Quad(high Dose 65+) 01/05/2020   Influenza Split 01/02/2012   Influenza Whole 02/04/2006, 02/10/2007, 01/28/2008   Influenza, High Dose Seasonal PF 01/30/2017, 01/30/2018, 02/13/2019   Influenza,inj,Quad PF,6+ Mos 02/04/2013, 05/18/2016    Influenza-Unspecified 02/10/2014, 01/21/2017, 01/08/2022   PFIZER Comirnaty(Gray Top)Covid-19 Tri-Sucrose Vaccine 05/30/2019, 06/20/2019, 03/01/2020, 09/30/2020   Pneumococcal Conjugate-13 05/18/2016   Pneumococcal Polysaccharide-23 11/21/2004, 07/22/2017   Td 11/21/2004   Tdap 04/06/2019   Zoster Recombinant(Shingrix ) 04/06/2019, 09/10/2019   Zoster, Live 07/02/2012     Objective: Vital Signs: BP 122/78 (BP Location: Left Arm, Patient Position: Sitting, Cuff Size: Normal)   Pulse 70   Resp 14   Ht 5' 3 (1.6 m)   Wt 177 lb (80.3 kg)   BMI 31.35 kg/m    Physical Exam  Cardiovascular:     Rate and Rhythm: Normal rate and regular rhythm.  Pulmonary:     Effort: Pulmonary effort is normal.     Breath sounds: Normal breath sounds.   Skin:    General: Skin is warm and dry.   Neurological:     Mental Status: She is alert.   Psychiatric:        Mood and Affect: Mood normal.      Musculoskeletal Exam:  Hip normal internal and external rotation without pain, no tenderness to lateral hip palpation Knee range of motion slightly restricted in extension and flexion is limited just past 90 degrees, right knee medial and lateral joint line tenderness to pressure, no palpable effusion  Investigation: No additional findings.  Imaging: No results found.  Recent Labs: Lab Results  Component Value Date   WBC 7.1 10/01/2018   HGB 13.3 10/01/2018   PLT 242 10/01/2018   NA 142 10/05/2021   K 4.4 10/05/2021   CL 104 10/05/2021   CO2 25 10/05/2021   GLUCOSE 99 10/05/2021   BUN 11 10/05/2021   CREATININE 0.79 10/05/2021   BILITOT 0.2 09/30/2020   ALKPHOS 112 09/30/2020   AST 19 09/30/2020   ALT 8 09/30/2020   PROT 7.0 09/30/2020   ALBUMIN 4.3 09/30/2020   CALCIUM  10.0 10/05/2021   GFRAA 71 10/23/2019    Speciality Comments: No specialty comments available.  Procedures:  Large Joint Inj: R knee on 10/04/2023 11:35 AM Indications: pain Details: 27 G 1.5 in needle,  lateral approach Medications: 2 mL lidocaine  1 %; 40 mg triamcinolone  acetonide 40 MG/ML Outcome: tolerated well, no immediate complications Procedure, treatment alternatives, risks and benefits explained, specific risks discussed. Consent was given by the patient. Immediately prior to procedure a time out was called to verify the correct patient, procedure, equipment, support staff and site/side marked as required. Patient was prepped and draped in the usual sterile fashion.     Allergies: Patient has no known allergies.   Assessment / Plan:     Visit Diagnoses: Primary osteoarthritis of right knee - Plan: Large Joint Inj: R knee, traMADol  (ULTRAM ) 50 MG tablet, DISCONTINUED: traMADol  (ULTRAM ) 50 MG tablet Chronic osteoarthritis with severe pain due to bone-on-bone contact. Previous interventions provided temporary relief. Pain radiates to the ankle, affecting sleep. Discussed anatomical challenges and benefits of altering injection site. Explained risks of long-term tramadol  use. - Administer steroid injection in the right knee at a different site to minimize irritation. - Will look into trial of repeat visco supplementation as she strongly wants to avoid surgery. - Prescribe tramadol  for short-term use at night. - Educated about risks of long-term opioid use, including tolerance, sedation, and constipation.      Orders: Orders Placed This Encounter  Procedures   Large Joint Inj: R knee   Meds ordered this encounter  Medications   DISCONTD: traMADol  (ULTRAM ) 50 MG tablet    Sig: Take 1 tablet (50 mg total) by mouth at bedtime as needed.    Dispense:  15 tablet    Refill:  0   traMADol  (ULTRAM ) 50 MG tablet    Sig: Take 1 tablet (50 mg total) by mouth at bedtime as needed.    Dispense:  15 tablet    Refill:  0     Follow-Up Instructions: Return if symptoms worsen or fail to improve, for Knee OA inj f/u PRN.   Lonni LELON Ester, MD  Note - This record has been created using  [employer].  Chart creation errors have been sought, but may not always  have  been located. Such creation errors do not reflect on  the standard of medical care.

## 2023-10-04 NOTE — Telephone Encounter (Signed)
 Tramadol  has been sent to [facility] on [address]. By Dr. Rodell Citrin.

## 2023-10-04 NOTE — Telephone Encounter (Signed)
 Pt would like her medication (tramadol ) to be sent to the [facility] on [address], [place], [place] [address]

## 2023-10-14 ENCOUNTER — Other Ambulatory Visit

## 2023-10-17 ENCOUNTER — Ambulatory Visit: Admitting: "Endocrinology

## 2023-10-19 MED ORDER — LIDOCAINE HCL 1 % IJ SOLN
2.0000 mL | INTRAMUSCULAR | Status: AC | PRN
  Administered 2023-10-04: 2 mL

## 2023-10-19 MED ORDER — TRIAMCINOLONE ACETONIDE 40 MG/ML IJ SUSP
40.0000 mg | INTRAMUSCULAR | Status: AC | PRN
Start: 2023-10-04 — End: 2023-10-04
  Administered 2023-10-04: 40 mg via INTRA_ARTICULAR

## 2023-10-25 ENCOUNTER — Other Ambulatory Visit: Payer: Self-pay | Admitting: Student

## 2023-10-25 DIAGNOSIS — M7541 Impingement syndrome of right shoulder: Secondary | ICD-10-CM

## 2023-11-17 ENCOUNTER — Other Ambulatory Visit: Payer: Self-pay | Admitting: "Endocrinology

## 2023-11-18 ENCOUNTER — Telehealth: Payer: Self-pay | Admitting: Internal Medicine

## 2023-11-18 NOTE — Telephone Encounter (Signed)
 Patient called stating she is still experiencing pain in her right knee and would like to apply for the gel injections that were discussed at her last appointment.

## 2023-11-19 NOTE — Telephone Encounter (Signed)
 Requested Prescriptions   Pending Prescriptions Disp Refills   methimazole  (TAPAZOLE ) 5 MG tablet [Pharmacy Med Name: methIMAzole  5 MG Oral Tablet] 45 tablet 3    Sig: TAKE ONE-HALF TABLET BY MOUTH  DAILY

## 2023-11-22 ENCOUNTER — Other Ambulatory Visit

## 2023-11-28 ENCOUNTER — Ambulatory Visit: Admitting: "Endocrinology

## 2023-12-05 ENCOUNTER — Encounter

## 2023-12-09 ENCOUNTER — Other Ambulatory Visit: Payer: Self-pay

## 2023-12-18 ENCOUNTER — Other Ambulatory Visit

## 2023-12-24 ENCOUNTER — Ambulatory Visit: Admitting: "Endocrinology

## 2023-12-24 ENCOUNTER — Other Ambulatory Visit: Payer: Self-pay | Admitting: Nurse Practitioner

## 2023-12-24 DIAGNOSIS — Z1231 Encounter for screening mammogram for malignant neoplasm of breast: Secondary | ICD-10-CM

## 2023-12-30 NOTE — Telephone Encounter (Signed)
 Yes, she should be a candidate for visco injections. We last tried steroid injections 10/04/23 for osteoarthritis with limited response.

## 2023-12-31 NOTE — Telephone Encounter (Signed)
VOB submitted for Orthovisc, Right knee(s) BV pending 

## 2024-01-08 NOTE — Telephone Encounter (Signed)
 Patient will call back once she's home from [place] . Pt stated she will be back in about a month.

## 2024-01-08 NOTE — Telephone Encounter (Signed)
 Please call to schedule visco injections.  Approved for Synvisc, Right knee(s). Buy & Bill No co-pay Deductible does not apply Both drug and procedure are covered at 100% Authorization #[id number] Authorization Dates:  01/08/2024 to 01/07/2025

## 2024-01-27 ENCOUNTER — Ambulatory Visit
Admission: RE | Admit: 2024-01-27 | Discharge: 2024-01-27 | Disposition: A | Source: Ambulatory Visit | Attending: Nurse Practitioner | Admitting: Nurse Practitioner

## 2024-01-27 DIAGNOSIS — Z1231 Encounter for screening mammogram for malignant neoplasm of breast: Secondary | ICD-10-CM

## 2024-02-18 ENCOUNTER — Other Ambulatory Visit: Payer: Self-pay | Admitting: "Endocrinology

## 2024-02-18 DIAGNOSIS — R7303 Prediabetes: Secondary | ICD-10-CM

## 2024-02-18 DIAGNOSIS — E0590 Thyrotoxicosis, unspecified without thyrotoxic crisis or storm: Secondary | ICD-10-CM

## 2024-04-23 ENCOUNTER — Emergency Department (HOSPITAL_COMMUNITY)

## 2024-04-23 ENCOUNTER — Emergency Department (HOSPITAL_COMMUNITY)
Admission: EM | Admit: 2024-04-23 | Discharge: 2024-04-23 | Disposition: A | Discharge status: Home / Self Care | Attending: Emergency Medicine | Admitting: Emergency Medicine

## 2024-04-23 DIAGNOSIS — Z7982 Long term (current) use of aspirin: Secondary | ICD-10-CM | POA: Diagnosis not present

## 2024-04-23 DIAGNOSIS — R10A0 Flank pain, unspecified side: Secondary | ICD-10-CM

## 2024-04-23 DIAGNOSIS — Z79899 Other long term (current) drug therapy: Secondary | ICD-10-CM | POA: Diagnosis not present

## 2024-04-23 DIAGNOSIS — R1032 Left lower quadrant pain: Secondary | ICD-10-CM | POA: Insufficient documentation

## 2024-04-23 DIAGNOSIS — R350 Frequency of micturition: Secondary | ICD-10-CM | POA: Insufficient documentation

## 2024-04-23 DIAGNOSIS — M545 Low back pain, unspecified: Secondary | ICD-10-CM | POA: Diagnosis not present

## 2024-04-23 LAB — CBC WITH DIFFERENTIAL/PLATELET
Abs Immature Granulocytes: 0.02 K/uL (ref 0.00–0.07)
Basophils Absolute: 0.1 K/uL (ref 0.0–0.1)
Basophils Relative: 1 %
Eosinophils Absolute: 0.2 K/uL (ref 0.0–0.5)
Eosinophils Relative: 4 %
HCT: 46.6 % — ABNORMAL HIGH (ref 36.0–46.0)
Hemoglobin: 14.9 g/dL (ref 12.0–15.0)
Immature Granulocytes: 0 %
Lymphocytes Relative: 33 %
Lymphs Abs: 2 K/uL (ref 0.7–4.0)
MCH: 29.9 pg (ref 26.0–34.0)
MCHC: 32 g/dL (ref 30.0–36.0)
MCV: 93.6 fL (ref 80.0–100.0)
Monocytes Absolute: 0.6 K/uL (ref 0.1–1.0)
Monocytes Relative: 9 %
Neutro Abs: 3.2 K/uL (ref 1.7–7.7)
Neutrophils Relative %: 53 %
Platelets: 233 K/uL (ref 150–400)
RBC: 4.98 MIL/uL (ref 3.87–5.11)
RDW: 14.6 % (ref 11.5–15.5)
WBC: 6.1 K/uL (ref 4.0–10.5)
nRBC: 0 % (ref 0.0–0.2)

## 2024-04-23 LAB — COMPREHENSIVE METABOLIC PANEL WITH GFR
ALT: 10 U/L (ref 0–44)
AST: 24 U/L (ref 15–41)
Albumin: 4.4 g/dL (ref 3.5–5.0)
Alkaline Phosphatase: 111 U/L (ref 38–126)
Anion gap: 11 (ref 5–15)
BUN: 12 mg/dL (ref 8–23)
CO2: 25 mmol/L (ref 22–32)
Calcium: 10.8 mg/dL — ABNORMAL HIGH (ref 8.9–10.3)
Chloride: 106 mmol/L (ref 98–111)
Creatinine, Ser: 0.78 mg/dL (ref 0.44–1.00)
GFR, Estimated: >60 mL/min
Glucose, Bld: 97 mg/dL (ref 70–99)
Potassium: 3.8 mmol/L (ref 3.5–5.1)
Sodium: 143 mmol/L (ref 135–145)
Total Bilirubin: 0.6 mg/dL (ref 0.0–1.2)
Total Protein: 7.8 g/dL (ref 6.5–8.1)

## 2024-04-23 LAB — URINALYSIS, W/ REFLEX TO CULTURE (INFECTION SUSPECTED)
Bilirubin Urine: NEGATIVE
Glucose, UA: NEGATIVE mg/dL
Hgb urine dipstick: NEGATIVE
Ketones, ur: NEGATIVE mg/dL
Leukocytes,Ua: NEGATIVE
Nitrite: NEGATIVE
Protein, ur: NEGATIVE mg/dL
Specific Gravity, Urine: 1.019 (ref 1.005–1.030)
pH: 7 (ref 5.0–8.0)

## 2024-04-23 LAB — LIPASE, BLOOD: Lipase: 16 U/L (ref 11–51)

## 2024-04-23 MED ORDER — TRAMADOL HCL 50 MG PO TABS: 50.0000 mg | ORAL_TABLET | Freq: Four times a day (QID) | ORAL | 0 refills | Status: AC | PRN

## 2024-04-23 MED ORDER — IOHEXOL 300 MG/ML  SOLN
100.0000 mL | Freq: Once | INTRAMUSCULAR | Status: AC | PRN
  Administered 2024-04-23: 100 mL via INTRAVENOUS

## 2024-04-23 MED ORDER — VALACYCLOVIR HCL 1 G PO TABS: 1000.0000 mg | ORAL_TABLET | Freq: Three times a day (TID) | ORAL | 0 refills | Status: AC

## 2024-04-23 MED ORDER — LIDOCAINE 5 % EX PTCH: 1.0000 | MEDICATED_PATCH | CUTANEOUS | 0 refills | Status: DC

## 2024-04-23 MED ORDER — VALACYCLOVIR HCL 1 G PO TABS: 1000.0000 mg | ORAL_TABLET | Freq: Three times a day (TID) | ORAL | 0 refills | Status: DC

## 2024-04-23 MED ORDER — LIDOCAINE 5 % EX PTCH: 1.0000 | MEDICATED_PATCH | CUTANEOUS | 0 refills | Status: AC

## 2024-04-23 MED ORDER — DICLOFENAC SODIUM 1 % EX GEL: 4.0000 g | Freq: Four times a day (QID) | CUTANEOUS | 0 refills | Status: AC

## 2024-04-23 MED ORDER — MORPHINE SULFATE (PF) 4 MG/ML IV SOLN
4.0000 mg | Freq: Once | INTRAVENOUS | Status: AC
  Administered 2024-04-23: 4 mg via INTRAVENOUS
  Filled 2024-04-23: qty 1

## 2024-04-23 MED ORDER — DICLOFENAC SODIUM 1 % EX GEL: 4.0000 g | Freq: Four times a day (QID) | CUTANEOUS | 0 refills | Status: DC

## 2024-04-23 MED ORDER — SODIUM CHLORIDE 0.9 % IV BOLUS
1000.0000 mL | Freq: Once | INTRAVENOUS | Status: AC
  Administered 2024-04-23: 1000 mL via INTRAVENOUS

## 2024-04-23 MED ORDER — TRAMADOL HCL 50 MG PO TABS: 50.0000 mg | ORAL_TABLET | Freq: Four times a day (QID) | ORAL | 0 refills | Status: DC | PRN

## 2024-04-23 NOTE — ED Triage Notes (Signed)
 Patient c/o left lower back pain starting Monday Rated 10/10 Denies radiation of pain Reports urinary frequency  Denies injury

## 2024-04-23 NOTE — ED Provider Notes (Signed)
 I was asked to follow-up on the patient's urinalysis at the time of signout.  Plan is for discharge plus or minus antibiotics.  The patient's urinalysis does not show findings consistent with infection.  On reassessment the patient is feeling much better.  Discussed with her that this could be due to radicular pain.  She reports that she has been medicated with tramadol  as needed in the past.  I have encouraged the patient to start taking Tylenol  around-the-clock.  Will also give her lidocaine  patches and Voltaren  gel.  Will give her a small amount of tramadol  to take as needed.  Given her history of shingles I do not appreciate a shingles rash but will also send a prescription for Valtrex to take if she does notice a rash as this could cause some radicular symptoms. Physical Exam  BP 123/75 (BP Location: Right Arm)   Pulse 73   Temp 97.7 F (36.5 C) (Oral)   Resp 18   Ht 5' 3 (1.6 m)   Wt 81.6 kg   SpO2 97%   BMI 31.89 kg/m   Physical Exam  Procedures  Procedures  ED Course / MDM    Medical Decision Making Amount and/or Complexity of Data Reviewed Labs: ordered. Radiology: ordered.  Risk Prescription drug management.          Ula Prentice SAUNDERS, MD 04/23/24 [phone]

## 2024-04-23 NOTE — Discharge Instructions (Signed)
 Please take 1000 mg of Tylenol  every 8 hours.  You may also use the Voltaren  gel on the area as well as the lidocaine  patches.  If you are still having pain take the tramadol .  Do not drive or drink alcohol while taking the tramadol  as it may make you drowsy.  If you do notice that you develop blisters or worsening rash you may pick up the Valtrex and start this.  If you do not notice any rash please do not pick this up.

## 2024-04-23 NOTE — ED Provider Notes (Signed)
 " [place] EMERGENCY DEPARTMENT AT [facility] Provider Note   CSN: [id number] Arrival date & time: 04/23/24  1141     Patient presents with: Back Pain   Wendy Heath is a 74 y.o. female.   HPI 74 year old female presents with left sided back pain.  Symptoms originally started 3 days ago.  She think she woke up with it.  It is a sharp pain that gets worse with certain movements or walking but is also present all the time to some degree.  She denies any fevers, vomiting, diarrhea.  She did have some urinary frequency over the last couple days but no dysuria and those seem to be gone.  She denies any abdominal pain.  She tried some leftover tramadol  from a prior knee surgery that has transiently helped.  No chest pain or shortness of breath.  No weakness or numbness in her extremities or bowel/bladder incontinence.  Prior to Admission medications  Medication Sig Start Date End Date Taking? Authorizing Provider  amLODipine  (NORVASC ) 10 MG tablet Take 1 tablet (10 mg total) by mouth at bedtime. NEEDS APPT FOR FUTURE REFILLS. 06/12/22   Howell Lunger, DO  amoxicillin -clavulanate (AUGMENTIN ) 875-125 MG tablet Take 1 tablet by mouth 2 (two) times daily. 08/13/22   Malvina Ellen, MD  aspirin 81 MG tablet Take 81 mg by mouth daily.     [provider]  benzonatate  (TESSALON ) 200 MG capsule Take 1 capsule (200 mg total) by mouth 2 (two) times daily as needed for cough. Patient not taking: Reported on 10/04/2023 03/28/21   Paige, Victoria J, DO  cetirizine  (ZYRTEC ) 10 MG tablet Take 1 tablet (10 mg total) by mouth daily. Patient not taking: Reported on 10/04/2023 01/15/22   Howell Lunger, DO  cholecalciferol (VITAMIN D ) 400 UNITS TABS Take 1,000 Units by mouth daily.     [provider]  diclofenac  Sodium (VOLTAREN ) 1 % GEL Apply 4 g topically 4 (four) times daily. Apply to both knees 4 times a day Patient not taking: Reported on 10/04/2023 09/25/21   Hope Merle, MD  famotidine  (PEPCID ) 20 MG tablet TAKE 1 TABLET BY MOUTH DAILY 09/19/22   Howell Lunger, DO  fluticasone  (FLONASE ) 50 MCG/ACT nasal spray USE 2 SPRAYS IN BOTH  NOSTRILS DAILY 04/12/22   Howell Lunger, DO  gabapentin  (NEURONTIN ) 300 MG capsule TAKE 1 CAPSULE BY MOUTH TWICE  DAILY 10/29/23   Elicia Hamlet, MD  lisinopril  (ZESTRIL ) 10 MG tablet TAKE 1 TABLET BY MOUTH AT  BEDTIME 06/05/22   Howell Lunger, DO  magic mouthwash (lidocaine , diphenhydrAMINE , alum & mag hydroxide) suspension Swish and spit 5 mLs 3 (three) times daily as needed for mouth pain. Patient not taking: Reported on 10/04/2023 08/13/22   Malvina Ellen, MD  meloxicam  (MOBIC ) 7.5 MG tablet 1 tablet Orally Once a day    [provider]  methimazole  (TAPAZOLE ) 5 MG tablet TAKE ONE-HALF TABLET BY MOUTH  DAILY 11/19/23   Motwani, Komal, MD  rosuvastatin  (CRESTOR ) 10 MG tablet TAKE 1 TABLET BY MOUTH DAILY 07/02/23   Howell Lunger, DO  traMADol  (ULTRAM ) 50 MG tablet Take 1 tablet (50 mg total) by mouth at bedtime as needed. 10/04/23   Rice, Lonni ORN, MD    Allergies: Patient has no known allergies.    Review of Systems  Respiratory:  Negative for shortness of breath.   Cardiovascular:  Negative for chest pain.  Gastrointestinal:  Negative for diarrhea and vomiting.  Genitourinary:  Positive for frequency. Negative for  dysuria.  Musculoskeletal:  Positive for back pain.  Neurological:  Negative for weakness and numbness.    Updated Vital Signs BP 123/75 (BP Location: Right Arm)   Pulse 73   Temp 97.7 F (36.5 C) (Oral)   Resp 18   Ht 5' 3 (1.6 m)   Wt 81.6 kg   SpO2 97%   BMI 31.89 kg/m   Physical Exam Vitals and nursing note reviewed.  Constitutional:      General: She is not in acute distress.    Appearance: She is well-developed. She is not ill-appearing or diaphoretic.  HENT:     Head: Normocephalic and atraumatic.  Cardiovascular:     Rate and Rhythm: Normal rate and regular rhythm.      Heart sounds: Normal heart sounds.  Pulmonary:     Effort: Pulmonary effort is normal.     Breath sounds: Normal breath sounds.  Abdominal:     General: There is no distension.     Palpations: Abdomen is soft.     Tenderness: There is abdominal tenderness in the left lower quadrant.   Musculoskeletal:     Lumbar back: Tenderness present. No bony tenderness.       Back:  Skin:    General: Skin is warm and dry.  Neurological:     Mental Status: She is alert.     Comments: 5/5 strength in BLE, grossly normal sensation.     (all labs ordered are listed, but only abnormal results are displayed) Labs Reviewed  COMPREHENSIVE METABOLIC PANEL WITH GFR - Abnormal; Notable for the following components:      Result Value   Calcium  10.8 (*)    All other components within normal limits  CBC WITH DIFFERENTIAL/PLATELET - Abnormal; Notable for the following components:   HCT 46.6 (*)    All other components within normal limits  LIPASE, BLOOD  URINALYSIS, W/ REFLEX TO CULTURE (INFECTION SUSPECTED)    EKG: None  Radiology: CT ABDOMEN PELVIS W CONTRAST Result Date: 04/23/2024 EXAM: CT ABDOMEN AND PELVIS WITH CONTRAST 04/23/2024 03:23:48 PM TECHNIQUE: CT of the abdomen and pelvis was performed with the administration of 100 mL of iohexol  (OMNIPAQUE ) 300 MG/ML solution. Multiplanar reformatted images are provided for review. Automated exposure control, iterative reconstruction, and/or weight-based adjustment of the mA/kV was utilized to reduce the radiation dose to as low as reasonably achievable. COMPARISON: None available. CLINICAL HISTORY: left flank, LLQ pain. FINDINGS: LOWER CHEST: No acute abnormality. LIVER: The liver is unremarkable. GALLBLADDER AND BILE DUCTS: Gallbladder is unremarkable. No biliary ductal dilatation. SPLEEN: Splenule noted. The spleen is unremarkable. PANCREAS: No acute abnormality. ADRENAL GLANDS: No acute abnormality. KIDNEYS, URETERS AND BLADDER: No stones in the  kidneys or ureters. No hydronephrosis. No perinephric or periureteral stranding. No filling defects of the partially visualized collecting systems on delayed imaging. Urinary bladder is unremarkable. GI AND BOWEL: Tiny hiatal hernia. Stomach demonstrates no acute abnormality. No small or large bowel thickening or dilatation. The appendix is not definitely identified with no inflammatory changes in the right lower quadrant to suggest acute appendicitis. Colonic diverticulosis. There is no bowel obstruction. PERITONEUM AND RETROPERITONEUM: No ascites. No free air. VASCULATURE: Aorta is normal in caliber. Severe atherosclerotic plaque. LYMPH NODES: No lymphadenopathy. REPRODUCTIVE ORGANS: Status post hysterectomy. No adnexal mass. BONES AND SOFT TISSUES: No acute osseous abnormality. No focal soft tissue abnormality. Intervertebral disc spacece vacuum phenomenon at the L5-S1 level. IMPRESSION: 1. No acute findings in the abdomen or pelvis. 2. Severe atherosclerotic plaque. Electronically  signed by: Morgane Naveau MD 04/23/2024 03:36 PM EST RP Workstation: [id number]     Procedures   Medications Ordered in the ED  morphine  (PF) 4 MG/ML injection 4 mg (4 mg Intravenous Given 04/23/24 1346)  sodium chloride  0.9 % bolus 1,000 mL (0 mLs Intravenous Stopped 04/23/24 1602)  iohexol  (OMNIPAQUE ) 300 MG/ML solution 100 mL (100 mLs Intravenous Contrast Given 04/23/24 1511)                                    Medical Decision Making Amount and/or Complexity of Data Reviewed Labs: ordered.    Details: Mild hypercalcemia, normal renal function and WBC Radiology: ordered and independent interpretation performed.    Details: No diverticulitis or kidney stone  Risk Prescription drug management.   Patient presents with back pain.  Does have some mild erythematous areas but on reevaluation the seem to go away.  Could have been an atypical zoster but more likely just from where she is rubbing it.  She states she has  actually had zoster and this does not feel similar.  CT does not show diverticulitis.  Lab work overall unremarkable.  Given some fluids and pain meds.  Urine is currently pending.  Otherwise appears quite well.  No signs/symptoms concerning for spinal cord emergency.  Care transferred to Dr. Ula.     Final diagnoses:  None    ED Discharge Orders     None          Freddi Hamilton, MD 04/23/24 1621  "

## 2024-05-09 ENCOUNTER — Inpatient Hospital Stay (HOSPITAL_COMMUNITY)
Admission: EM | Admit: 2024-05-09 | Discharge: 2024-05-11 | DRG: 395 | Disposition: A | Discharge status: Home / Self Care | Attending: Family Medicine | Admitting: Family Medicine

## 2024-05-09 ENCOUNTER — Inpatient Hospital Stay (HOSPITAL_COMMUNITY)

## 2024-05-09 ENCOUNTER — Other Ambulatory Visit: Payer: Self-pay

## 2024-05-09 ENCOUNTER — Emergency Department (HOSPITAL_COMMUNITY)

## 2024-05-09 DIAGNOSIS — E876 Hypokalemia: Secondary | ICD-10-CM | POA: Diagnosis present

## 2024-05-09 DIAGNOSIS — Z823 Family history of stroke: Secondary | ICD-10-CM

## 2024-05-09 DIAGNOSIS — R197 Diarrhea, unspecified: Secondary | ICD-10-CM | POA: Diagnosis present

## 2024-05-09 DIAGNOSIS — M47817 Spondylosis without myelopathy or radiculopathy, lumbosacral region: Secondary | ICD-10-CM | POA: Diagnosis present

## 2024-05-09 DIAGNOSIS — E0590 Thyrotoxicosis, unspecified without thyrotoxic crisis or storm: Secondary | ICD-10-CM | POA: Diagnosis present

## 2024-05-09 DIAGNOSIS — E1159 Type 2 diabetes mellitus with other circulatory complications: Secondary | ICD-10-CM | POA: Diagnosis present

## 2024-05-09 DIAGNOSIS — K922 Gastrointestinal hemorrhage, unspecified: Secondary | ICD-10-CM | POA: Diagnosis present

## 2024-05-09 DIAGNOSIS — Z803 Family history of malignant neoplasm of breast: Secondary | ICD-10-CM

## 2024-05-09 DIAGNOSIS — Z923 Personal history of irradiation: Secondary | ICD-10-CM

## 2024-05-09 DIAGNOSIS — F17210 Nicotine dependence, cigarettes, uncomplicated: Secondary | ICD-10-CM | POA: Diagnosis present

## 2024-05-09 DIAGNOSIS — I69398 Other sequelae of cerebral infarction: Secondary | ICD-10-CM | POA: Diagnosis not present

## 2024-05-09 DIAGNOSIS — E1169 Type 2 diabetes mellitus with other specified complication: Secondary | ICD-10-CM | POA: Diagnosis present

## 2024-05-09 DIAGNOSIS — Z833 Family history of diabetes mellitus: Secondary | ICD-10-CM

## 2024-05-09 DIAGNOSIS — Z90710 Acquired absence of both cervix and uterus: Secondary | ICD-10-CM | POA: Diagnosis not present

## 2024-05-09 DIAGNOSIS — Z853 Personal history of malignant neoplasm of breast: Secondary | ICD-10-CM | POA: Diagnosis not present

## 2024-05-09 DIAGNOSIS — Z860102 Personal history of hyperplastic colon polyps: Secondary | ICD-10-CM | POA: Diagnosis not present

## 2024-05-09 DIAGNOSIS — G8929 Other chronic pain: Secondary | ICD-10-CM | POA: Diagnosis present

## 2024-05-09 DIAGNOSIS — R52 Pain, unspecified: Secondary | ICD-10-CM

## 2024-05-09 DIAGNOSIS — Z720 Tobacco use: Secondary | ICD-10-CM | POA: Diagnosis present

## 2024-05-09 DIAGNOSIS — E669 Obesity, unspecified: Secondary | ICD-10-CM | POA: Diagnosis present

## 2024-05-09 DIAGNOSIS — Z9221 Personal history of antineoplastic chemotherapy: Secondary | ICD-10-CM | POA: Diagnosis not present

## 2024-05-09 DIAGNOSIS — E785 Hyperlipidemia, unspecified: Secondary | ICD-10-CM

## 2024-05-09 DIAGNOSIS — Z79899 Other long term (current) drug therapy: Secondary | ICD-10-CM

## 2024-05-09 DIAGNOSIS — I152 Hypertension secondary to endocrine disorders: Secondary | ICD-10-CM | POA: Diagnosis present

## 2024-05-09 DIAGNOSIS — Z8249 Family history of ischemic heart disease and other diseases of the circulatory system: Secondary | ICD-10-CM | POA: Diagnosis not present

## 2024-05-09 DIAGNOSIS — H5462 Unqualified visual loss, left eye, normal vision right eye: Secondary | ICD-10-CM | POA: Diagnosis present

## 2024-05-09 DIAGNOSIS — M130 Polyarthritis, unspecified: Secondary | ICD-10-CM | POA: Diagnosis present

## 2024-05-09 DIAGNOSIS — K649 Unspecified hemorrhoids: Principal | ICD-10-CM | POA: Diagnosis present

## 2024-05-09 DIAGNOSIS — Z6830 Body mass index (BMI) 30.0-30.9, adult: Secondary | ICD-10-CM

## 2024-05-09 DIAGNOSIS — E039 Hypothyroidism, unspecified: Secondary | ICD-10-CM | POA: Diagnosis present

## 2024-05-09 DIAGNOSIS — M545 Low back pain, unspecified: Secondary | ICD-10-CM | POA: Diagnosis present

## 2024-05-09 DIAGNOSIS — K219 Gastro-esophageal reflux disease without esophagitis: Secondary | ICD-10-CM | POA: Diagnosis present

## 2024-05-09 DIAGNOSIS — E7849 Other hyperlipidemia: Secondary | ICD-10-CM | POA: Diagnosis present

## 2024-05-09 DIAGNOSIS — E119 Type 2 diabetes mellitus without complications: Secondary | ICD-10-CM | POA: Diagnosis not present

## 2024-05-09 DIAGNOSIS — Z7982 Long term (current) use of aspirin: Secondary | ICD-10-CM

## 2024-05-09 DIAGNOSIS — M549 Dorsalgia, unspecified: Secondary | ICD-10-CM | POA: Diagnosis present

## 2024-05-09 LAB — COMPREHENSIVE METABOLIC PANEL WITH GFR
ALT: 9 U/L (ref 0–44)
AST: 22 U/L (ref 15–41)
Albumin: 4.3 g/dL (ref 3.5–5.0)
Alkaline Phosphatase: 105 U/L (ref 38–126)
Anion gap: 15 (ref 5–15)
BUN: 11 mg/dL (ref 8–23)
CO2: 23 mmol/L (ref 22–32)
Calcium: 10.3 mg/dL (ref 8.9–10.3)
Chloride: 105 mmol/L (ref 98–111)
Creatinine, Ser: 0.76 mg/dL (ref 0.44–1.00)
GFR, Estimated: >60 mL/min
Glucose, Bld: 115 mg/dL — ABNORMAL HIGH (ref 70–99)
Potassium: 3.3 mmol/L — ABNORMAL LOW (ref 3.5–5.1)
Sodium: 142 mmol/L (ref 135–145)
Total Bilirubin: 0.5 mg/dL (ref 0.0–1.2)
Total Protein: 7.5 g/dL (ref 6.5–8.1)

## 2024-05-09 LAB — CBC WITH DIFFERENTIAL/PLATELET
Abs Immature Granulocytes: 0.04 K/uL (ref 0.00–0.07)
Basophils Absolute: 0 K/uL (ref 0.0–0.1)
Basophils Relative: 0 %
Eosinophils Absolute: 0.2 K/uL (ref 0.0–0.5)
Eosinophils Relative: 2 %
HCT: 49.2 % — ABNORMAL HIGH (ref 36.0–46.0)
Hemoglobin: 16 g/dL — ABNORMAL HIGH (ref 12.0–15.0)
Immature Granulocytes: 0 %
Lymphocytes Relative: 25 %
Lymphs Abs: 2.4 K/uL (ref 0.7–4.0)
MCH: 30.3 pg (ref 26.0–34.0)
MCHC: 32.5 g/dL (ref 30.0–36.0)
MCV: 93.2 fL (ref 80.0–100.0)
Monocytes Absolute: 0.7 K/uL (ref 0.1–1.0)
Monocytes Relative: 7 %
Neutro Abs: 6.2 K/uL (ref 1.7–7.7)
Neutrophils Relative %: 66 %
Platelets: 236 K/uL (ref 150–400)
RBC: 5.28 MIL/uL — ABNORMAL HIGH (ref 3.87–5.11)
RDW: 14.8 % (ref 11.5–15.5)
WBC: 9.5 K/uL (ref 4.0–10.5)
nRBC: 0 % (ref 0.0–0.2)

## 2024-05-09 LAB — HEMOGLOBIN AND HEMATOCRIT, BLOOD
HCT: 44.1 % (ref 36.0–46.0)
Hemoglobin: 14.7 g/dL (ref 12.0–15.0)

## 2024-05-09 LAB — I-STAT CG4 LACTIC ACID, ED: Lactic Acid, Venous: 1.4 mmol/L (ref 0.5–1.9)

## 2024-05-09 LAB — CBG MONITORING, ED: Glucose-Capillary: 111 mg/dL — ABNORMAL HIGH (ref 70–99)

## 2024-05-09 LAB — PROTIME-INR
INR: 0.9 (ref 0.8–1.2)
Prothrombin Time: 13 s (ref 11.4–15.2)

## 2024-05-09 LAB — POC OCCULT BLOOD, ED: Fecal Occult Bld: POSITIVE — AB

## 2024-05-09 MED ORDER — POTASSIUM CHLORIDE 10 MEQ/100ML IV SOLN
10.0000 meq | INTRAVENOUS | Status: AC
  Administered 2024-05-09 – 2024-05-10 (×4): 10 meq via INTRAVENOUS
  Filled 2024-05-09 (×4): qty 100

## 2024-05-09 MED ORDER — TRAMADOL HCL 50 MG PO TABS
50.0000 mg | ORAL_TABLET | Freq: Once | ORAL | Status: AC
  Administered 2024-05-09: 50 mg via ORAL
  Filled 2024-05-09: qty 1

## 2024-05-09 MED ORDER — ACETAMINOPHEN 325 MG PO TABS
650.0000 mg | ORAL_TABLET | Freq: Once | ORAL | Status: AC
  Administered 2024-05-09: 650 mg via ORAL
  Filled 2024-05-09: qty 2

## 2024-05-09 MED ORDER — MORPHINE SULFATE (PF) 2 MG/ML IV SOLN
2.0000 mg | Freq: Once | INTRAVENOUS | Status: AC
  Administered 2024-05-09: 2 mg via INTRAVENOUS
  Filled 2024-05-09: qty 1

## 2024-05-09 MED ORDER — PANTOPRAZOLE SODIUM 40 MG IV SOLR
40.0000 mg | Freq: Two times a day (BID) | INTRAVENOUS | Status: DC
  Administered 2024-05-09 – 2024-05-11 (×4): 40 mg via INTRAVENOUS
  Filled 2024-05-09 (×4): qty 10

## 2024-05-09 MED ORDER — LIDOCAINE 5 % EX PTCH
1.0000 | MEDICATED_PATCH | CUTANEOUS | Status: DC
  Administered 2024-05-09: 1 via TRANSDERMAL
  Filled 2024-05-09: qty 1

## 2024-05-09 MED ORDER — IOHEXOL 350 MG/ML SOLN
100.0000 mL | Freq: Once | INTRAVENOUS | Status: AC | PRN
  Administered 2024-05-10: 100 mL via INTRAVENOUS

## 2024-05-09 MED ORDER — INSULIN ASPART 100 UNIT/ML IJ SOLN: 0.0000 [IU] | INTRAMUSCULAR | Status: DC

## 2024-05-09 NOTE — Assessment & Plan Note (Signed)
 Severe lumbar pain -   [facility] imaging no frx  CT lumbar spine pending  If non-diagnostic may need further imaging with MRI is pain persist  At this time no neurological signs to suggest need for emergent imaging

## 2024-05-09 NOTE — Assessment & Plan Note (Signed)
 Allow permissive hypertension for tonight

## 2024-05-09 NOTE — H&P (Incomplete)
 "    Wendy Heath FMW:[id number] DOB: [birth year] DOA: 05/09/2024    PCP: Patient, No Pcp Per   Outpatient Specialists: * NONE CARDS: * Dr. None  NEphrology: *  Dr. No care team member to display  NEurology *   Dr. Pulmonary *  Dr.  Oncology * Dr.No care team member to display  GI* Dr.  Gwen, LB) No care team member to display Urology Dr. *  Patient arrived to ER on 05/09/24 at 1820 Referred by Attending Fredia Rosette Kirsch, MD   Patient coming from:    home Lives alone,   *** With family From facility *** SNF, [place], [place]    Chief Complaint:   Chief Complaint  Patient presents with   Rectal Bleeding   Flank Pain    HPI: Wendy Heath is a 74 y.o. female with medical history significant of Dm2, HLD, HTN, GERD,     Presented with  back pain Patient presents with blood in stool today noted that she has blood when she wipes.  2 weeks ago for back pain at that point CT scan was unremarkable has been having significant abdominal pain Pain been ongoing for past few days no urinary complaints No fevers or chills Reports she had opted to bloody bowel movements starting this morning since then she only has a bit of blood when she wipes. She continues to have left lower back pain and abdominal pain And emergency department Hemoccult positive Hemoglobin stable     Denies significant ETOH intake *** Does not smoke*** but interested in quitting***  Denies marijuana use ***    Regarding pertinent Chronic problems:    Hyperlipidemia - on statins Crestor  Lipid Panel     Component Value Date/Time   CHOL 118 09/25/2021 1126   TRIG 98 09/25/2021 1126   HDL 45 09/25/2021 1126   CHOLHDL 2.6 09/25/2021 1126   CHOLHDL 3 07/02/2012 0956   VLDL 26.2 07/02/2012 0956   LDLCALC 54 09/25/2021 1126   LABVLDL 19 09/25/2021 1126     HTN on NOrvasc , lisinopril          DM 2 -  Lab Results  Component Value Date   HGBA1C 5.9 04/12/2022   ****on insulin , PO meds  only, diet controlled     *** Asthma -well *** controlled on home inhalers/ nebs                     *** COPD - not **followed by pulmonology *** not  on baseline oxygen  *L,    *** OSA -on nocturnal oxygen, *CPAP, *noncompliant with CPAP  *** Hx of CVA - *with/out residual deficits on Aspirin 81 mg, 325, Plavix       While in ER:   Hemoglobin stable at 16 Potassium down to 3.3 Hemoccult positive    Lab Orders         CBC with Differential         Protime-INR         Comprehensive metabolic panel with GFR         Hemoglobin and hematocrit, blood         I-Stat Lactic Acid         POC occult blood, ED Provider will collect      Pelvis imaging showed no fractures Plain films of the lumbar spine Moderate disc space narrowing at L5-S1 with facet arthropathy.   CTabd/pelvis - ***nonacute  CTA chest - ***nonacute, no PE, * no evidence of  infiltrate  Following Medications were ordered in ER: Medications  lidocaine  (LIDODERM ) 5 % 1 patch (1 patch Transdermal Patch Applied 05/09/24 2108)  morphine  (PF) 2 MG/ML injection 2 mg (2 mg Intravenous Given 05/09/24 1956)  acetaminophen  (TYLENOL ) tablet 650 mg (650 mg Oral Given 05/09/24 2107)  traMADol  (ULTRAM ) tablet 50 mg (50 mg Oral Given 05/09/24 2108)    _______________________________________________________ ER Provider Called:       DrGOMEZ  They Recommend admit to medicine *** Will see in AM  ***SEEN in ER     ED Triage Vitals  Encounter Vitals Group     BP 05/09/24 1831 (!) 163/106     Girls Systolic BP Percentile --      Girls Diastolic BP Percentile --      Boys Systolic BP Percentile --      Boys Diastolic BP Percentile --      Pulse Rate 05/09/24 1831 100     Resp 05/09/24 1831 19     Temp 05/09/24 1831 98.2 F (36.8 C)     Temp Source 05/09/24 1831 Oral     SpO2 05/09/24 1831 100 %     Weight --      Height --      Head Circumference --      Peak Flow --      Pain Score 05/09/24 1830 10     Pain Loc --       Pain Education --      Exclude from Growth Chart --   UFJK(75)@     _________________________________________ Significant initial  Findings: Abnormal Labs Reviewed  CBC WITH DIFFERENTIAL/PLATELET - Abnormal; Notable for the following components:      Result Value   RBC 5.28 (*)    Hemoglobin 16.0 (*)    HCT 49.2 (*)    All other components within normal limits  COMPREHENSIVE METABOLIC PANEL WITH GFR - Abnormal; Notable for the following components:   Potassium 3.3 (*)    Glucose, Bld 115 (*)    All other components within normal limits  POC OCCULT BLOOD, ED - Abnormal; Notable for the following components:   Fecal Occult Bld POSITIVE (*)    All other components within normal limits      _________________________ Troponin ***ordered Cardiac Panel (last 3 results) No results for input(s): CKTOTAL, CKMB, TROPONINIHS, RELINDX in the last 72 hours.   ECG: Ordered Personally reviewed and interpreted by me showing: HR : *** Rhythm: *NSR, Sinus tachycardia * A.fib. W RVR, RBBB, LBBB, Paced Ischemic changes*nonspecific changes, no evidence of ischemic changes QTC*  The recent clinical data is shown below. Vitals:   05/09/24 1831 05/09/24 2159 05/09/24 2202  BP: (!) 163/106 138/84 138/84  Pulse: 100 88 88  Resp: 19  14  Temp: 98.2 F (36.8 C) 97.8 F (36.6 C)   TempSrc: Oral Oral   SpO2: 100% 96% 96%     WBC     Component Value Date/Time   WBC 9.5 05/09/2024 1911   LYMPHSABS 2.4 05/09/2024 1911   LYMPHSABS 1.8 03/26/2016 1308   MONOABS 0.7 05/09/2024 1911   MONOABS 0.6 03/26/2016 1308   EOSABS 0.2 05/09/2024 1911   EOSABS 0.2 03/26/2016 1308   BASOSABS 0.0 05/09/2024 1911   BASOSABS 0.1 03/26/2016 1308    Lactic Acid, Venous    Component Value Date/Time   LATICACIDVEN 1.4 05/09/2024 1945      UA *** no evidence of UTI  ***Pending ***not ordered   Urine analysis:  Component Value Date/Time   COLORURINE STRAW (A) 04/23/2024 1602   APPEARANCEUR  CLEAR 04/23/2024 1602   LABSPEC 1.019 04/23/2024 1602   LABSPEC 1.020 03/01/2014 1630   PHURINE 7.0 04/23/2024 1602   GLUCOSEU NEGATIVE 04/23/2024 1602   GLUCOSEU Negative 03/01/2014 1630   HGBUR NEGATIVE 04/23/2024 1602   HGBUR small 01/06/2008 1120   BILIRUBINUR NEGATIVE 04/23/2024 1602   BILIRUBINUR small (A) 07/02/2019 1700   BILIRUBINUR Negative 03/01/2014 1630   KETONESUR NEGATIVE 04/23/2024 1602   PROTEINUR NEGATIVE 04/23/2024 1602   UROBILINOGEN 1.0 07/02/2019 1700   UROBILINOGEN 0.2 03/01/2014 1630   NITRITE NEGATIVE 04/23/2024 1602   LEUKOCYTESUR NEGATIVE 04/23/2024 1602   LEUKOCYTESUR Trace 03/01/2014 1630    Results for orders placed or performed in visit on 03/01/14  Urine Culture     Status: None   Collection Time: 03/01/14  4:30 PM   Specimen: Urine  Result Value Ref Range Status   Urine Culture, Routine Culture, Urine  Final    Comment: Final - ===== COLONY COUNT: ===== >=100,000 COLONIES/ML Multiple bacterial morphotypes present, none predominant. Suggest appropriate recollection if  clinically indicated.     ABX started Antibiotics Given (last 72 hours)     None        No results found for the last 90 days.    ________________________________________________________________  Arterial ***Venous  Blood Gas result:  pH *** pCO2 ***; pO2 ***;     %O2 Sat ***.  ABG No results found for: PHART, PCO2ART, PO2ART, HCO3, TCO2, ACIDBASEDEF, O2SAT     __________________________________________________________ Recent Labs  Lab 05/09/24 2024  NA 142  K 3.3*  CO2 23  GLUCOSE 115*  BUN 11  CREATININE 0.76  CALCIUM  10.3    Cr  stable,    Lab Results  Component Value Date   CREATININE 0.76 05/09/2024   CREATININE 0.78 04/23/2024   CREATININE 0.79 10/05/2021    Recent Labs  Lab 05/09/24 2024  AST 22  ALT 9  ALKPHOS 105  BILITOT 0.5  PROT 7.5  ALBUMIN 4.3   Lab Results  Component Value Date   CALCIUM  10.3 05/09/2024    PHOS 2.6 02/03/2013       Plt: Lab Results  Component Value Date   PLT 236 05/09/2024   \   Recent Labs  Lab 05/09/24 1911 05/09/24 2146  WBC 9.5  --   NEUTROABS 6.2  --   HGB 16.0* 14.7  HCT 49.2* 44.1  MCV 93.2  --   PLT 236  --     HG/HCT   Stable     Component Value Date/Time   HGB 14.7 05/09/2024 2146   HGB 13.3 10/01/2018 1124   HGB 13.8 03/26/2016 1308   HCT 44.1 05/09/2024 2146   HCT 41.2 10/01/2018 1124   HCT 42.6 03/26/2016 1308   MCV 93.2 05/09/2024 1911   MCV 90 10/01/2018 1124   MCV 94.2 03/26/2016 1308   _______________________________________________ Hospitalist was called for admission for *** There are no diagnoses linked to this encounter.   The following Work up has been ordered so far:  Orders Placed This Encounter  Procedures   DG Lumbar Spine 2-3 Views   DG Hip Unilat W or Wo Pelvis 2-3 Views Left   CBC with Differential   Protime-INR   Comprehensive metabolic panel with GFR   Hemoglobin and hematocrit, blood   Consult to hospitalist   I-Stat Lactic Acid   POC occult blood, ED Provider will collect   Saline  lock IV     OTHER Significant initial  Findings:  labs showing:     DM  labs:  HbA1C: No results for input(s): HGBA1C in the last 8760 hours.     CBG (last 3)  No results for input(s): GLUCAP in the last 72 hours.        Cultures:    Component Value Date/Time   SDES URINE, CLEAN CATCH 02/03/2013 1528   SPECREQUEST NONE 02/03/2013 1528   CULT  02/03/2013 1528    Multiple bacterial morphotypes present, none predominant. Suggest appropriate recollection if clinically indicated. Performed at [facility]   REPTSTATUS 02/04/2013 FINAL 02/03/2013 1528     Radiological Exams on Admission: DG Hip Unilat W or Wo Pelvis 2-3 Views Left Result Date: 05/09/2024 EXAM: 2 or 3 VIEW(S) XRAY OF THE BILATERAL HIP 05/09/2024 10:27:56 PM COMPARISON: None available. CLINICAL HISTORY: left lower back pain  FINDINGS: BONES AND JOINTS: No acute fracture. LUMBAR SPINE: Moderate degenerative changes of the lower lumbar spine. JOINTS: Mild degenerative changes of the right hip. Mild degenerative changes of the left hip. SOFT TISSUES: Unremarkable. IMPRESSION: 1. No acute findings. Electronically signed by: Greig Pique MD 05/09/2024 10:35 PM EST RP Workstation: [id number]   DG Lumbar Spine 2-3 Views Result Date: 05/09/2024 EXAM: 2 or 3 VIEW(S) XRAY OF THE LUMBAR SPINE 05/09/2024 10:27:56 PM COMPARISON: None available. CLINICAL HISTORY: left lower back pain FINDINGS: LUMBAR SPINE: BONES: Vertebral body heights are maintained. Alignment is normal. DISCS AND DEGENERATIVE CHANGES: There is moderate disc space narrowing at L5-S1 with facet arthropathy. SOFT TISSUES: There are atherosclerotic calcifications throughout the aorta. No acute abnormality. IMPRESSION: 1. No acute findings. 2. Moderate disc space narrowing at L5-S1 with facet arthropathy. 3. Atherosclerotic calcifications throughout the aorta. Electronically signed by: Greig Pique MD 05/09/2024 10:34 PM EST RP Workstation: [id number]   _______________________________________________________________________________________________________ Latest  Blood pressure 138/84, pulse 88, temperature 97.8 F (36.6 C), temperature source Oral, resp. rate 14, SpO2 96%.   Vitals  labs and radiology finding personally reviewed  Review of Systems:    Pertinent positives include: ***  Constitutional:  No weight loss, night sweats, Fevers, chills, fatigue, weight loss  HEENT:  No headaches, Difficulty swallowing,Tooth/dental problems,Sore throat,  No sneezing, itching, ear ache, nasal congestion, post nasal drip,  Cardio-vascular:  No chest pain, Orthopnea, PND, anasarca, dizziness, palpitations.no Bilateral lower extremity swelling  GI:  No heartburn, indigestion, abdominal pain, nausea, vomiting, diarrhea, change in bowel habits, loss of appetite, melena, blood  in stool, hematemesis Resp:  no shortness of breath at rest. No dyspnea on exertion, No excess mucus, no productive cough, No non-productive cough, No coughing up of blood.No change in color of mucus.No wheezing. Skin:  no rash or lesions. No jaundice GU:  no dysuria, change in color of urine, no urgency or frequency. No straining to urinate.  No flank pain.  Musculoskeletal:  No joint pain or no joint swelling. No decreased range of motion. No back pain.  Psych:  No change in mood or affect. No depression or anxiety. No memory loss.  Neuro: no localizing neurological complaints, no tingling, no weakness, no double vision, no gait abnormality, no slurred speech, no confusion  All systems reviewed and apart from HOPI all are negative _______________________________________________________________________________________________ Past Medical History:   Past Medical History:  Diagnosis Date   Abnormal mammogram of left breast 05/28/2019   12/2018 Dg MM: Diagnostic left mammogram is recommended in 6 months to confirm stability of Probably benign calcifications at the lumpectomy  site in the left breast without definite appreciable change compared to multiple priors.     Blind right eye 07/04/2011   Since stroke 2003   CEREBROVASCULAR ACCIDENT, HX OF 08/24/2006   Chronic pain of both knees 12/28/2008   Qualifier: Diagnosis of  By: Gladis FNP, Nykedtra     Diverticulitis    Dizziness 05/16/2018   DOMESTIC ABUSE, HX OF 08/24/2006   Qualifier: Diagnosis of  By: Loretha MD, Victoria     GERD 08/24/2006   History of left breast cancer 03/25/2015   HYPERLIPIDEMIA 08/24/2006   Hyperlipidemia associated with type 2 diabetes mellitus (HCC) 08/24/2006   Qualifier: Diagnosis of  By: Loretha MD, Victoria     HYPERTENSION 08/24/2006   Hypertension associated with diabetes (HCC) 08/24/2006   Qualifier: Diagnosis of  By: Loretha MD, Victoria     Impaired glucose tolerance 07/04/2011   Malignant neoplasm  of breast (female), unspecified site 03/13/2011   Nodule of skin of right hand 12/15/2019   OBESITY 12/28/2008   Qualifier: Diagnosis of  By: Gladis FNP, Nykedtra     Pain in both thighs 04/01/2018   Personal history of chemotherapy    Personal history of radiation therapy 12/26/2006   Postural hypotension 06/19/2018   Seasonal allergies 05/14/2012   Stroke (HCC) 2005   Type 2 diabetes mellitus without complications (HCC) 05/20/2016      Past Surgical History:  Procedure Laterality Date   ABDOMINAL HYSTERECTOMY  1995   BREAST BIOPSY  06/10/2006   malignant   BREAST LUMPECTOMY Left 11/25/2006   malignant   COLONOSCOPY N/A 02/05/2013   Procedure: COLONOSCOPY;  Surgeon: Belvie JONETTA Just, MD;  Location: [facility];  Service: Endoscopy;  Laterality: N/A;    Social History:  Ambulatory *** independently cane, walker  wheelchair bound, bed bound     reports that she has been smoking cigarettes. She has a 12.5 pack-year smoking history. She has been exposed to tobacco smoke. She has never used smokeless tobacco. She reports current alcohol use. She reports that she does not use drugs.     Family History: *** Family History  Problem Relation Age of Onset   Stroke Other    Cancer Other        breast cancer   Cancer Other        breast cancer   Stroke Other    Hypertension Other    Diabetes Other    Sudden death Other    Breast cancer Mother    Heart disease Father    Breast cancer Sister    Heart disease Brother    ______________________________________________________________________________________________ Allergies: Allergies[1]   Prior to Admission medications  Medication Sig Start Date End Date Taking? Authorizing Provider  amLODipine  (NORVASC ) 10 MG tablet Take 1 tablet (10 mg total) by mouth at bedtime. NEEDS APPT FOR FUTURE REFILLS. 06/12/22   Howell Gladis, DO  amoxicillin -clavulanate (AUGMENTIN ) 875-125 MG tablet Take 1 tablet by mouth 2  (two) times daily. 08/13/22   Malvina Ellen, MD  aspirin 81 MG tablet Take 81 mg by mouth daily.     [provider]  benzonatate  (TESSALON ) 200 MG capsule Take 1 capsule (200 mg total) by mouth 2 (two) times daily as needed for cough. Patient not taking: Reported on 10/04/2023 03/28/21   Paige, Victoria J, DO  cetirizine  (ZYRTEC ) 10 MG tablet Take 1 tablet (10 mg total) by mouth daily. Patient not taking: Reported on 10/04/2023 01/15/22   Howell Gladis, DO  cholecalciferol (VITAMIN D ) 400 UNITS  TABS Take 1,000 Units by mouth daily.     [provider]  diclofenac  Sodium (VOLTAREN ) 1 % GEL Apply 4 g topically 4 (four) times daily. Apply to both knees 4 times a day Patient not taking: Reported on 10/04/2023 09/25/21   Hope Merle, MD  diclofenac  Sodium (VOLTAREN ) 1 % GEL Apply 4 g topically 4 (four) times daily. 04/23/24   Ula Prentice SAUNDERS, MD  famotidine  (PEPCID ) 20 MG tablet TAKE 1 TABLET BY MOUTH DAILY 09/19/22   Howell Lunger, DO  fluticasone  (FLONASE ) 50 MCG/ACT nasal spray USE 2 SPRAYS IN BOTH  NOSTRILS DAILY 04/12/22   Howell Lunger, DO  gabapentin  (NEURONTIN ) 300 MG capsule TAKE 1 CAPSULE BY MOUTH TWICE  DAILY 10/29/23   Elicia Hamlet, MD  lidocaine  (LIDODERM ) 5 % Place 1 patch onto the skin daily. Remove & Discard patch within 12 hours or as directed by MD 04/23/24   Ula Prentice SAUNDERS, MD  lisinopril  (ZESTRIL ) 10 MG tablet TAKE 1 TABLET BY MOUTH AT  BEDTIME 06/05/22   Howell Lunger, DO  magic mouthwash (lidocaine , diphenhydrAMINE , alum & mag hydroxide) suspension Swish and spit 5 mLs 3 (three) times daily as needed for mouth pain. Patient not taking: Reported on 10/04/2023 08/13/22   Malvina Ellen, MD  meloxicam  (MOBIC ) 7.5 MG tablet 1 tablet Orally Once a day    [provider]  methimazole  (TAPAZOLE ) 5 MG tablet TAKE ONE-HALF TABLET BY MOUTH  DAILY 11/19/23   Motwani, Komal, MD  rosuvastatin  (CRESTOR ) 10 MG tablet TAKE 1 TABLET BY MOUTH DAILY 07/02/23   Howell Lunger, DO   traMADol  (ULTRAM ) 50 MG tablet Take 1 tablet (50 mg total) by mouth at bedtime as needed. 10/04/23   Rice, Lonni ORN, MD  traMADol  (ULTRAM ) 50 MG tablet Take 1 tablet (50 mg total) by mouth every 6 (six) hours as needed. 04/23/24   Ula Prentice SAUNDERS, MD  valACYclovir  (VALTREX ) 1000 MG tablet Take 1 tablet (1,000 mg total) by mouth 3 (three) times daily. 04/23/24   Ula Prentice SAUNDERS, MD    ___________________________________________________________________________________________________ Physical Exam:    05/09/2024   10:02 PM 05/09/2024    9:59 PM 05/09/2024    6:31 PM  Vitals with BMI  Systolic 138 138 836  Diastolic 84 84 106  Pulse 88 88 100     1. General:  in No ***Acute distress***increased work of breathing ***complaining of severe pain****agitated * Chronically ill *well *cachectic *toxic acutely ill -appearing 2. Psychological: Alert and *** Oriented 3. Head/ENT:   Moist *** Dry Mucous Membranes                          Head Non traumatic, neck supple                          Normal *** Poor Dentition 4. SKIN: normal *** decreased Skin turgor,  Skin clean Dry and intact no rash    5. Heart: Regular rate and rhythm no*** Murmur, no Rub or gallop 6. Lungs: ***Clear to auscultation bilaterally, no wheezes or crackles   7. Abdomen: Soft, ***non-tender, Non distended *** obese ***bowel sounds present 8. Lower extremities: no clubbing, cyanosis, no ***edema 9. Neurologically Grossly intact, moving all 4 extremities equally *** strength 5 out of 5 in all 4 extremities cranial nerves II through XII intact 10. MSK: Normal range of motion    Chart has been reviewed  ______________________________________________________________________________________________  Assessment/Plan  ***  Admitted for *** There are no diagnoses linked to this encounter.   Present on Admission: **None**     No problem-specific Assessment & Plan notes found for this encounter.    Other plan as per  orders.  DVT prophylaxis:  SCD *** Lovenox       Code Status:    Code Status: Prior FULL CODE *** DNR/DNI ***comfort care as per patient ***family  I had personally discussed CODE STATUS with patient and family*  ACP *** none has been reviewed ***   Family Communication:   Family not at  Bedside  plan of care was discussed on the phone with *** Son, Daughter, Wife, Husband, Sister, Brother , father, mother  Diet    Disposition Plan:   *** likely will need placement for rehabilitation                          Back to current facility when stable                            To home once workup is complete and patient is stable  ***Following barriers for discharge:                             Chest pain *** Stroke *** Syncope ***work up is complete                            Electrolytes corrected                               Anemia corrected h/H stable                             Pain controlled with PO medications                               Afebrile, white count improving able to transition to PO antibiotics                             Will need to be able to tolerate PO                            Will likely need home health, home O2, set up                           Will need consultants to evaluate patient prior to discharge                           Work of breathing improves       Consult Orders  (From admission, onward)           Start     Ordered   05/09/24 2206  Consult to hospitalist  Once       Provider:  (Not yet assigned)  Question Answer Comment  Place call to: [facility]   Reason for Consult Admit      05/09/24 2205                              ***  Would benefit from PT/OT eval prior to DC  Ordered                   Swallow eval - SLP ordered                   Diabetes care coordinator                   Transition of care consulted                   Nutrition    consulted                  Wound care  consulted                    Palliative care    consulted                   Behavioral health  consulted                    Consults called: ***  NONE   Admission status:  ED Disposition     None        Obs***  ***  inpatient     I Expect 2 midnight stay secondary to severity of patient's current illness need for inpatient interventions justified by the following: ***hemodynamic instability despite optimal treatment (tachycardia *hypotension * tachypnea *hypoxia, hypercapnia) *** Severe lab/radiological/exam abnormalities including:    There are no diagnoses linked to this encounter. and extensive comorbidities including: *substance abuse  *Chronic pain *DM2  * CHF * CAD  * COPD/asthma *Morbid Obesity * CKD *dementia *liver disease *history of stroke with residual deficits *  malignancy, * sickle cell disease  History of amputation Chronic anticoagulation  That are currently affecting medical management.   I expect  patient to be hospitalized for 2 midnights requiring inpatient medical care.  Patient is at high risk for adverse outcome (such as loss of life or disability) if not treated.  Indication for inpatient stay as follows:  Severe change from baseline regarding mental status Hemodynamic instability despite maximal medical therapy,  severe pain requiring acute inpatient management,  inability to maintain oral hydration   persistent chest pain despite medical management Need for operative/procedural  intervention New or worsening hypoxia ongoing suicidal ideations   Need for IV antibiotics, IV fluids,, IV pain medications, IV anticoagulation,  IV rate controling medications, IV antihypertensives need for biPAP Frequent labs    Level of care   *** tele  For 12H 24H     medical floor       progressive     stepdown   tele indefinitely please discontinue once patient no longer qualifies COVID-19 Labs    Rein Popov 05/09/2024, 11:06 PM ***  [facility]      after 2 AM please page floor coverage   If 7AM-7PM, please contact the day team taking care of the patient using [email or web address]          [1] No Known Allergies "

## 2024-05-09 NOTE — Subjective & Objective (Signed)
 Patient presents with blood in stool today noted that she has blood when she wipes.  2 weeks ago for back pain at that point CT scan was unremarkable has been having significant abdominal pain Pain been ongoing for past few days no urinary complaints No fevers or chills Reports she had opted to bloody bowel movements starting this morning since then she only has a bit of blood when she wipes. She continues to have left lower back pain and abdominal pain And emergency department Hemoccult positive Hemoglobin stable

## 2024-05-09 NOTE — Assessment & Plan Note (Signed)
 Order sliding scale monitor for hypoglycemia

## 2024-05-09 NOTE — Assessment & Plan Note (Signed)
-   Suspect Lower Gi source  No hx of PUD,   - Admit  For further management given:  *Age >60 years,   comorbid illnesses   gross rectal bleeding,  rebleeding    - most likely Diverticular source   - Sent msg to gastroenterology (Dr. Rollin) they will see patient in a.m. appreciate their consult   - serial CBC.    - Monitor for any recurrence,  evidence of hemodynamic instability or significant blood loss -  type and screen,  - Transfuse as needed for hemoglobin below 7 or <9 if evidence of significant  bleeding  - Establish at least 2 PIV and fluid resuscitate   - clear liquids for tonight keep nothing by mouth post midnight,  -  monitor for Recurrent significant   CTA abd/pelvis ordered and pending

## 2024-05-09 NOTE — ED Provider Notes (Signed)
 " [place] [facility]ERGENCY DEPARTMENT AT [facility] Provider Note   CSN: [id number] Arrival date & time: 05/09/24  1820     Patient presents with: Rectal Bleeding and Flank Pain   Wendy Heath is a 74 y.o. female.  With history of diabetes, hyperlipidemia, hypertension, GERD, constipation who presents with left lower back pain.  This has been ongoing for couple of weeks.  No recent trauma.  No urinary symptoms.  No numbness or weakness or bowel/bladder dysfunction.  No fevers or chills.  However this morning between 3 AM and 6 AM she had a couple of bloody bowel movements and now still has blood when she wipes.  Seen in the ED on January 1 with left sided back pain.  Labs and CT were negative and pain was thought to be musculoskeletal and she was discharged home with lidocaine  patches, Voltaren  gel, Tylenol  around-the-clock and a small amount of tramadol .     Prior to Admission medications  Medication Sig Start Date End Date Taking? Authorizing Provider  amLODipine  (NORVASC ) 10 MG tablet Take 1 tablet (10 mg total) by mouth at bedtime. NEEDS APPT FOR FUTURE REFILLS. 06/12/22   Howell Lunger, DO  amoxicillin -clavulanate (AUGMENTIN ) 875-125 MG tablet Take 1 tablet by mouth 2 (two) times daily. 08/13/22   Malvina Ellen, MD  aspirin 81 MG tablet Take 81 mg by mouth daily.     [provider]  benzonatate  (TESSALON ) 200 MG capsule Take 1 capsule (200 mg total) by mouth 2 (two) times daily as needed for cough. Patient not taking: Reported on 10/04/2023 03/28/21   Paige, Victoria J, DO  cetirizine  (ZYRTEC ) 10 MG tablet Take 1 tablet (10 mg total) by mouth daily. Patient not taking: Reported on 10/04/2023 01/15/22   Howell Lunger, DO  cholecalciferol (VITAMIN D ) 400 UNITS TABS Take 1,000 Units by mouth daily.     [provider]  diclofenac  Sodium (VOLTAREN ) 1 % GEL Apply 4 g topically 4 (four) times daily. Apply to both knees 4 times a day Patient not taking:  Reported on 10/04/2023 09/25/21   Hope Merle, MD  diclofenac  Sodium (VOLTAREN ) 1 % GEL Apply 4 g topically 4 (four) times daily. 04/23/24   Ula Prentice SAUNDERS, MD  famotidine  (PEPCID ) 20 MG tablet TAKE 1 TABLET BY MOUTH DAILY 09/19/22   Howell Lunger, DO  fluticasone  (FLONASE ) 50 MCG/ACT nasal spray USE 2 SPRAYS IN BOTH  NOSTRILS DAILY 04/12/22   Howell Lunger, DO  gabapentin  (NEURONTIN ) 300 MG capsule TAKE 1 CAPSULE BY MOUTH TWICE  DAILY 10/29/23   Elicia Hamlet, MD  lidocaine  (LIDODERM ) 5 % Place 1 patch onto the skin daily. Remove & Discard patch within 12 hours or as directed by MD 04/23/24   Ula Prentice SAUNDERS, MD  lisinopril  (ZESTRIL ) 10 MG tablet TAKE 1 TABLET BY MOUTH AT  BEDTIME 06/05/22   Howell Lunger, DO  magic mouthwash (lidocaine , diphenhydrAMINE , alum & mag hydroxide) suspension Swish and spit 5 mLs 3 (three) times daily as needed for mouth pain. Patient not taking: Reported on 10/04/2023 08/13/22   Malvina Ellen, MD  meloxicam  (MOBIC ) 7.5 MG tablet 1 tablet Orally Once a day    [provider]  methimazole  (TAPAZOLE ) 5 MG tablet TAKE ONE-HALF TABLET BY MOUTH  DAILY 11/19/23   Motwani, Komal, MD  rosuvastatin  (CRESTOR ) 10 MG tablet TAKE 1 TABLET BY MOUTH DAILY 07/02/23   Howell Lunger, DO  traMADol  (ULTRAM ) 50 MG tablet Take 1 tablet (50 mg total) by mouth at  bedtime as needed. 10/04/23   Rice, Lonni ORN, MD  traMADol  (ULTRAM ) 50 MG tablet Take 1 tablet (50 mg total) by mouth every 6 (six) hours as needed. 04/23/24   Ula Prentice SAUNDERS, MD  valACYclovir  (VALTREX ) 1000 MG tablet Take 1 tablet (1,000 mg total) by mouth 3 (three) times daily. 04/23/24   Ula Prentice SAUNDERS, MD    Allergies: Patient has no known allergies.    Review of Systems  Genitourinary:  Positive for flank pain.    Updated Vital Signs BP 138/84 (BP Location: Left Arm)   Pulse 88   Temp 97.8 F (36.6 C) (Oral)   Resp 14   SpO2 96%   Physical Exam  Constitutional: Patient appears well-developed and well-nourished. No  distress.  HENT:  Head: Normocephalic and atraumatic.  Mouth/Throat: Oropharynx is clear and moist. No oropharyngeal exudate.  Eyes: Conjunctivae are normal. Pupils are equal, round, and reactive to light. Neck: Normal range of motion. Neck supple.  Cardiovascular: Normal rate, regular rhythm, normal heart sounds and intact distal pulses.   Pulmonary/Chest: Effort normal and breath sounds normal. No respiratory distress. No wheezes or rales.  Abdominal: Soft. Bowel sounds are normal. No distension or tenderness.  No CVA tenderness. Musculoskeletal: Normal inspection of back.  Tenderness in left lumbar paraspinal and SI region with no bony point tenderness, rash, redness, deformity.  Neurological: Patient is alert and oriented.  No facial droop.  Clear speech.  Normal strength and sensation throughout.  Normal coordination. Skin: Skin is warm and dry. No diaphoresis.  Psychiatric: Normal mood and affect. Normal behavior. Judgment and thought content normal.  Nursing note and vitals reviewed.  (all labs ordered are listed, but only abnormal results are displayed) Labs Reviewed  CBC WITH DIFFERENTIAL/PLATELET - Abnormal; Notable for the following components:      Result Value   RBC 5.28 (*)    Hemoglobin 16.0 (*)    HCT 49.2 (*)    All other components within normal limits  COMPREHENSIVE METABOLIC PANEL WITH GFR - Abnormal; Notable for the following components:   Potassium 3.3 (*)    Glucose, Bld 115 (*)    All other components within normal limits  POC OCCULT BLOOD, ED - Abnormal; Notable for the following components:   Fecal Occult Bld POSITIVE (*)    All other components within normal limits  PROTIME-INR  HEMOGLOBIN AND HEMATOCRIT, BLOOD  CK  MAGNESIUM  PHOSPHORUS  HEMOGLOBIN A1C  URINALYSIS, ROUTINE W REFLEX MICROSCOPIC  I-STAT CG4 LACTIC ACID, ED    EKG: None  Radiology: DG Hip Unilat W or Wo Pelvis 2-3 Views Left Result Date: 05/09/2024 EXAM: 2 or 3 VIEW(S) XRAY OF THE  BILATERAL HIP 05/09/2024 10:27:56 PM COMPARISON: None available. CLINICAL HISTORY: left lower back pain FINDINGS: BONES AND JOINTS: No acute fracture. LUMBAR SPINE: Moderate degenerative changes of the lower lumbar spine. JOINTS: Mild degenerative changes of the right hip. Mild degenerative changes of the left hip. SOFT TISSUES: Unremarkable. IMPRESSION: 1. No acute findings. Electronically signed by: Greig Pique MD 05/09/2024 10:35 PM EST RP Workstation: [id number]   DG Lumbar Spine 2-3 Views Result Date: 05/09/2024 EXAM: 2 or 3 VIEW(S) XRAY OF THE LUMBAR SPINE 05/09/2024 10:27:56 PM COMPARISON: None available. CLINICAL HISTORY: left lower back pain FINDINGS: LUMBAR SPINE: BONES: Vertebral body heights are maintained. Alignment is normal. DISCS AND DEGENERATIVE CHANGES: There is moderate disc space narrowing at L5-S1 with facet arthropathy. SOFT TISSUES: There are atherosclerotic calcifications throughout the aorta. No acute abnormality. IMPRESSION: 1.  No acute findings. 2. Moderate disc space narrowing at L5-S1 with facet arthropathy. 3. Atherosclerotic calcifications throughout the aorta. Electronically signed by: Greig Pique MD 05/09/2024 10:34 PM EST RP Workstation: [id number]     Medications Ordered in the ED  lidocaine  (LIDODERM ) 5 % 1 patch (1 patch Transdermal Patch Applied 05/09/24 2108)  potassium chloride  10 mEq in 100 mL IVPB (has no administration in time range)  insulin  aspart (novoLOG ) injection 0-9 Units (has no administration in time range)  morphine  (PF) 2 MG/ML injection 2 mg (2 mg Intravenous Given 05/09/24 1956)  acetaminophen  (TYLENOL ) tablet 650 mg (650 mg Oral Given 05/09/24 2107)  traMADol  (ULTRAM ) tablet 50 mg (50 mg Oral Given 05/09/24 2108)                                Medical Decision Making Amount and/or Complexity of Data Reviewed Labs: ordered. Radiology: ordered.  Risk OTC drugs. Prescription drug management.   This patient presents to the ED with chief  complaint(s) of left lower back pain with pertinent past medical history of hypertension, diabetes, hyperlipidemia. The complaint involves an extensive differential diagnosis and also carries with it a high risk of complications and morbidity.    Additional history obtained from family. I have also reviewed previous ED records  The differential diagnosis includes musculoskeletal pain, radiculopathy, renal colic, fracture  The initial management included labs, IV, analgesia    Reassessments:  The following labs were independently interpreted: Labs significant for borderline hypokalemia 3.3.  Initial hemoglobin 16.  I independently visualized the following imaging with scope of interpretation limited to determining acute life threatening conditions related to emergency care: hip and lumbar spine xrays negative for acute process  Treatment and Reassessment: Patient has been treated with IV morphine , tramadol , Tylenol , Lidoderm  and still has pain.  Unclear etiology as no bony tenderness, no red flags to suggest cauda equina, infection, no trauma to support fracture.  Could be lumbar strain versus SI joint dysfunction versus radiculopathy.  Also note Hemoccult positive.  Hemoglobin is stable so do not suspect large-volume GI bleed but have paged patient for admission given intractable pain and GI bleed.  Consultation: - Consulted or discussed management/test interpretation with external professional: Hospitalist who advises GI bleed scan, ordered, and she will contact GI.  Consideration for admission or further workup: Warrants admission  Social Determinants of health: None  Final diagnoses:  Acute GI bleeding  Intractable pain    ED Discharge Orders     None          Fredia Rosette Kirsch, MD 05/09/24 2321  "

## 2024-05-09 NOTE — Assessment & Plan Note (Signed)
 Protonix  40 mg . twice daily

## 2024-05-09 NOTE — H&P (Signed)
 "    Wendy Heath FMW:[id number] DOB: 04/12/1951 DOA: 05/09/2024    PCP: Patient, No Pcp Per      GI   Dr. Rollin  Patient arrived to ER on 05/09/24 at 1820 Referred by Attending Fredia Rosette Kirsch, MD   Patient coming from:    home Lives alone,       Chief Complaint:   Chief Complaint  Patient presents with   Rectal Bleeding   Flank Pain    HPI: Wendy Heath is a 74 y.o. female with medical history significant of DM2, HLD, HTN, GERD, hx of stroke with right eye vision loss, hyperthyroidism, breast cancer 2008 sp chemo and lumpectomy, tobacco abuse    Presented with  back pain Patient presents with blood in stool today noted that she has blood when she wipes.  2 weeks ago for back pain at that point CT scan was unremarkable has been having significant abdominal pain Pain been ongoing for past few days no urinary complaints No fevers or chills Reports she had opted to bloody bowel movements starting this morning since then she only has a bit of blood when she wipes. She continues to have left lower back pain and abdominal pain And emergency department Hemoccult positive Hemoglobin stable  She reports episodes of blood in stool Saturday Morning and a few episodes since Some diarrhea as well  The pain has been on going on for the past 3 weeks left side of the spine and radiates around to the abdomen Does not radiate down the legs No weakness no numbness or tingling Has has tried tramadol  and heat blanket for pain with no improvement Denies any injury, she just woke up one morning and had back pain She though it could be related to her mattress at first  Denies significant ETOH intake   Does  smoke  but interested in quitting      Regarding pertinent Chronic problems:    Hyperlipidemia - on statins Crestor  Lipid Panel     Component Value Date/Time   CHOL 118 09/25/2021 1126   TRIG 98 09/25/2021 1126   HDL 45 09/25/2021 1126   CHOLHDL 2.6 09/25/2021  1126   CHOLHDL 3 07/02/2012 0956   VLDL 26.2 07/02/2012 0956   LDLCALC 54 09/25/2021 1126   LABVLDL 19 09/25/2021 1126     HTN on NOrvasc , lisinopril        DM 2 -  Lab Results  Component Value Date   HGBA1C 5.9 04/12/2022    diet controlled       Hx of CVA -  with/ residual deficits on Aspirin 81 mg,      breast cancer 2008 sp chemo and lumpectomy and radiation While in ER:   Hemoglobin stable at 16 Potassium down to 3.3 Hemoccult positive    Lab Orders         CBC with Differential         Protime-INR         Comprehensive metabolic panel with GFR         Hemoglobin and hematocrit, blood         I-Stat Lactic Acid         POC occult blood, ED Provider will collect      Pelvis imaging showed no fractures Plain films of the lumbar spine Moderate disc space narrowing at L5-S1 with facet arthropathy.   CTabd/pelvis - ordred    Following Medications were ordered in ER: Medications  lidocaine  (LIDODERM ) 5 %  1 patch (1 patch Transdermal Patch Applied 05/09/24 2108)  morphine  (PF) 2 MG/ML injection 2 mg (2 mg Intravenous Given 05/09/24 1956)  acetaminophen  (TYLENOL ) tablet 650 mg (650 mg Oral Given 05/09/24 2107)  traMADol  (ULTRAM ) tablet 50 mg (50 mg Oral Given 05/09/24 2108)       ED Triage Vitals  Encounter Vitals Group     BP 05/09/24 1831 (!) 163/106     Girls Systolic BP Percentile --      Girls Diastolic BP Percentile --      Boys Systolic BP Percentile --      Boys Diastolic BP Percentile --      Pulse Rate 05/09/24 1831 100     Resp 05/09/24 1831 19     Temp 05/09/24 1831 98.2 F (36.8 C)     Temp Source 05/09/24 1831 Oral     SpO2 05/09/24 1831 100 %     Weight --      Height --      Head Circumference --      Peak Flow --      Pain Score 05/09/24 1830 10     Pain Loc --      Pain Education --      Exclude from Growth Chart --   UFJK(75)@     _________________________________________ Significant initial  Findings: Abnormal Labs Reviewed  CBC  WITH DIFFERENTIAL/PLATELET - Abnormal; Notable for the following components:      Result Value   RBC 5.28 (*)    Hemoglobin 16.0 (*)    HCT 49.2 (*)    All other components within normal limits  COMPREHENSIVE METABOLIC PANEL WITH GFR - Abnormal; Notable for the following components:   Potassium 3.3 (*)    Glucose, Bld 115 (*)    All other components within normal limits  POC OCCULT BLOOD, ED - Abnormal; Notable for the following components:   Fecal Occult Bld POSITIVE (*)    All other components within normal limits      ECG: Ordered Personally reviewed and interpreted by me showing: HR : 77 Rhythm: Sinus rhythm Left ventricular hypertrophy QTC 433  The recent clinical data is shown below. Vitals:   05/09/24 1831 05/09/24 2159 05/09/24 2202  BP: (!) 163/106 138/84 138/84  Pulse: 100 88 88  Resp: 19  14  Temp: 98.2 F (36.8 C) 97.8 F (36.6 C)   TempSrc: Oral Oral   SpO2: 100% 96% 96%     WBC     Component Value Date/Time   WBC 9.5 05/09/2024 1911   LYMPHSABS 2.4 05/09/2024 1911   LYMPHSABS 1.8 03/26/2016 1308   MONOABS 0.7 05/09/2024 1911   MONOABS 0.6 03/26/2016 1308   EOSABS 0.2 05/09/2024 1911   EOSABS 0.2 03/26/2016 1308   BASOSABS 0.0 05/09/2024 1911   BASOSABS 0.1 03/26/2016 1308    Lactic Acid, Venous    Component Value Date/Time   LATICACIDVEN 1.4 05/09/2024 1945      UA  ordered    Results for orders placed or performed in visit on 03/01/14  Urine Culture     Status: None   Collection Time: 03/01/14  4:30 PM   Specimen: Urine  Result Value Ref Range Status   Urine Culture, Routine Culture, Urine  Final    Comment: Final - ===== COLONY COUNT: ===== >=100,000 COLONIES/ML Multiple bacterial morphotypes present, none predominant. Suggest appropriate recollection if  clinically indicated.     _____________    __________________________________________________________ Recent Labs  Lab 05/09/24  2024  NA 142  K 3.3*  CO2 23  GLUCOSE 115*   BUN 11  CREATININE 0.76  CALCIUM  10.3    Cr  stable,    Lab Results  Component Value Date   CREATININE 0.76 05/09/2024   CREATININE 0.78 04/23/2024   CREATININE 0.79 10/05/2021    Recent Labs  Lab 05/09/24 2024  AST 22  ALT 9  ALKPHOS 105  BILITOT 0.5  PROT 7.5  ALBUMIN 4.3   Lab Results  Component Value Date   CALCIUM  10.3 05/09/2024   PHOS 2.6 02/03/2013       Plt: Lab Results  Component Value Date   PLT 236 05/09/2024   \   Recent Labs  Lab 05/09/24 1911 05/09/24 2146  WBC 9.5  --   NEUTROABS 6.2  --   HGB 16.0* 14.7  HCT 49.2* 44.1  MCV 93.2  --   PLT 236  --     HG/HCT   Stable     Component Value Date/Time   HGB 14.7 05/09/2024 2146   HGB 13.3 10/01/2018 1124   HGB 13.8 03/26/2016 1308   HCT 44.1 05/09/2024 2146   HCT 41.2 10/01/2018 1124   HCT 42.6 03/26/2016 1308   MCV 93.2 05/09/2024 1911   MCV 90 10/01/2018 1124   MCV 94.2 03/26/2016 1308   _______________________________________________ Hospitalist was called for admission for Lower gi bleed and back pain    The following Work up has been ordered so far:  Orders Placed This Encounter  Procedures   DG Lumbar Spine 2-3 Views   DG Hip Unilat W or Wo Pelvis 2-3 Views Left   CBC with Differential   Protime-INR   Comprehensive metabolic panel with GFR   Hemoglobin and hematocrit, blood   Consult to hospitalist   I-Stat Lactic Acid   POC occult blood, ED Provider will collect   Saline lock IV     OTHER Significant initial  Findings:  labs showing:     DM  labs:  HbA1C: No results for input(s): HGBA1C in the last 8760 hours.     CBG (last 3)  Recent Labs    05/09/24 2328  GLUCAP 111*          Cultures:    Component Value Date/Time   SDES URINE, CLEAN CATCH 02/03/2013 1528   SPECREQUEST NONE 02/03/2013 1528   CULT  02/03/2013 1528    Multiple bacterial morphotypes present, none predominant. Suggest appropriate recollection if clinically  indicated. Performed at [facility]   REPTSTATUS 02/04/2013 FINAL 02/03/2013 1528     Radiological Exams on Admission: DG Hip Unilat W or Wo Pelvis 2-3 Views Left Result Date: 05/09/2024 EXAM: 2 or 3 VIEW(S) XRAY OF THE BILATERAL HIP 05/09/2024 10:27:56 PM COMPARISON: None available. CLINICAL HISTORY: left lower back pain FINDINGS: BONES AND JOINTS: No acute fracture. LUMBAR SPINE: Moderate degenerative changes of the lower lumbar spine. JOINTS: Mild degenerative changes of the right hip. Mild degenerative changes of the left hip. SOFT TISSUES: Unremarkable. IMPRESSION: 1. No acute findings. Electronically signed by: Greig Pique MD 05/09/2024 10:35 PM EST RP Workstation: [id number]   DG Lumbar Spine 2-3 Views Result Date: 05/09/2024 EXAM: 2 or 3 VIEW(S) XRAY OF THE LUMBAR SPINE 05/09/2024 10:27:56 PM COMPARISON: None available. CLINICAL HISTORY: left lower back pain FINDINGS: LUMBAR SPINE: BONES: Vertebral body heights are maintained. Alignment is normal. DISCS AND DEGENERATIVE CHANGES: There is moderate disc space narrowing at L5-S1 with facet arthropathy. SOFT TISSUES: There are atherosclerotic  calcifications throughout the aorta. No acute abnormality. IMPRESSION: 1. No acute findings. 2. Moderate disc space narrowing at L5-S1 with facet arthropathy. 3. Atherosclerotic calcifications throughout the aorta. Electronically signed by: Greig Pique MD 05/09/2024 10:34 PM EST RP Workstation: [id number]   _______________________________________________________________________________________________________ Latest  Blood pressure 138/84, pulse 88, temperature 97.8 F (36.6 C), temperature source Oral, resp. rate 14, SpO2 96%.   Vitals  labs and radiology finding personally reviewed  Review of Systems:    Pertinent positives include:   blood in stool, flank pain. back pain.  Constitutional:  No weight loss, night sweats, Fevers, chills, fatigue, weight loss  HEENT:  No headaches,  Difficulty swallowing,Tooth/dental problems,Sore throat,  No sneezing, itching, ear ache, nasal congestion, post nasal drip,  Cardio-vascular:  No chest pain, Orthopnea, PND, anasarca, dizziness, palpitations.no Bilateral lower extremity swelling  GI:  No heartburn, indigestion, abdominal pain, nausea, vomiting, diarrhea, change in bowel habits, loss of appetite, melena, hematemesis Resp:  no shortness of breath at rest. No dyspnea on exertion, No excess mucus, no productive cough, No non-productive cough, No coughing up of blood.No change in color of mucus.No wheezing. Skin:  no rash or lesions. No jaundice GU:  no dysuria, change in color of urine, no urgency or frequency. No straining to urinate.  No  Musculoskeletal:  No joint pain or no joint swelling. No decreased range of motion. No  Psych:  No change in mood or affect. No depression or anxiety. No memory loss.  Neuro: no localizing neurological complaints, no tingling, no weakness, no double vision, no gait abnormality, no slurred speech, no confusion  All systems reviewed and apart from HOPI all are negative _______________________________________________________________________________________________ Past Medical History:   Past Medical History:  Diagnosis Date   Abnormal mammogram of left breast 05/28/2019   12/2018 Dg MM: Diagnostic left mammogram is recommended in 6 months to confirm stability of Probably benign calcifications at the lumpectomy site in the left breast without definite appreciable change compared to multiple priors.     Blind right eye 07/04/2011   Since stroke 2003   CEREBROVASCULAR ACCIDENT, HX OF 08/24/2006   Chronic pain of both knees 12/28/2008   Qualifier: Diagnosis of  By: Gladis FNP, Nykedtra     Diverticulitis    Dizziness 05/16/2018   DOMESTIC ABUSE, HX OF 08/24/2006   Qualifier: Diagnosis of  By: Loretha MD, Victoria     GERD 08/24/2006   History of left breast cancer 03/25/2015   HYPERLIPIDEMIA  08/24/2006   Hyperlipidemia associated with type 2 diabetes mellitus (HCC) 08/24/2006   Qualifier: Diagnosis of  By: Loretha MD, Victoria     HYPERTENSION 08/24/2006   Hypertension associated with diabetes (HCC) 08/24/2006   Qualifier: Diagnosis of  By: Loretha MD, Victoria     Impaired glucose tolerance 07/04/2011   Malignant neoplasm of breast (female), unspecified site 03/13/2011   Nodule of skin of right hand 12/15/2019   OBESITY 12/28/2008   Qualifier: Diagnosis of  By: Gladis FNP, Nykedtra     Pain in both thighs 04/01/2018   Personal history of chemotherapy    Personal history of radiation therapy 12/26/2006   Postural hypotension 06/19/2018   Seasonal allergies 05/14/2012   Stroke (HCC) 2005   Type 2 diabetes mellitus without complications (HCC) 05/20/2016      Past Surgical History:  Procedure Laterality Date   ABDOMINAL HYSTERECTOMY  1995   BREAST BIOPSY  06/10/2006   malignant   BREAST LUMPECTOMY Left 11/25/2006   malignant   COLONOSCOPY N/A  02/05/2013   Procedure: COLONOSCOPY;  Surgeon: Belvie JONETTA Just, MD;  Location: [facility];  Service: Endoscopy;  Laterality: N/A;    Social History:  Ambulatory   cane,      reports that she has been smoking cigarettes. She has a 12.5 pack-year smoking history. She has been exposed to tobacco smoke. She has never used smokeless tobacco. She reports current alcohol use. She reports that she does not use drugs.   Family History:   Family History  Problem Relation Age of Onset   Stroke Other    Cancer Other        breast cancer   Cancer Other        breast cancer   Stroke Other    Hypertension Other    Diabetes Other    Sudden death Other    Breast cancer Mother    Heart disease Father    Breast cancer Sister    Heart disease Brother    ______________________________________________________________________________________________ Allergies: Allergies[1]   Prior to Admission medications  Medication Sig Start Date End Date  Taking? Authorizing Provider  amLODipine  (NORVASC ) 10 MG tablet Take 1 tablet (10 mg total) by mouth at bedtime. NEEDS APPT FOR FUTURE REFILLS. 06/12/22   Howell Lunger, DO  amoxicillin -clavulanate (AUGMENTIN ) 875-125 MG tablet Take 1 tablet by mouth 2 (two) times daily. 08/13/22   Malvina Ellen, MD  aspirin 81 MG tablet Take 81 mg by mouth daily.     [provider]  benzonatate  (TESSALON ) 200 MG capsule Take 1 capsule (200 mg total) by mouth 2 (two) times daily as needed for cough. Patient not taking: Reported on 10/04/2023 03/28/21   Paige, Victoria J, DO  cetirizine  (ZYRTEC ) 10 MG tablet Take 1 tablet (10 mg total) by mouth daily. Patient not taking: Reported on 10/04/2023 01/15/22   Howell Lunger, DO  cholecalciferol (VITAMIN D ) 400 UNITS TABS Take 1,000 Units by mouth daily.     [provider]  diclofenac  Sodium (VOLTAREN ) 1 % GEL Apply 4 g topically 4 (four) times daily. Apply to both knees 4 times a day Patient not taking: Reported on 10/04/2023 09/25/21   Hope Merle, MD  diclofenac  Sodium (VOLTAREN ) 1 % GEL Apply 4 g topically 4 (four) times daily. 04/23/24   Ula Prentice SAUNDERS, MD  famotidine  (PEPCID ) 20 MG tablet TAKE 1 TABLET BY MOUTH DAILY 09/19/22   Howell Lunger, DO  fluticasone  (FLONASE ) 50 MCG/ACT nasal spray USE 2 SPRAYS IN BOTH  NOSTRILS DAILY 04/12/22   Howell Lunger, DO  gabapentin  (NEURONTIN ) 300 MG capsule TAKE 1 CAPSULE BY MOUTH TWICE  DAILY 10/29/23   Elicia Hamlet, MD  lidocaine  (LIDODERM ) 5 % Place 1 patch onto the skin daily. Remove & Discard patch within 12 hours or as directed by MD 04/23/24   Ula Prentice SAUNDERS, MD  lisinopril  (ZESTRIL ) 10 MG tablet TAKE 1 TABLET BY MOUTH AT  BEDTIME 06/05/22   Howell Lunger, DO  magic mouthwash (lidocaine , diphenhydrAMINE , alum & mag hydroxide) suspension Swish and spit 5 mLs 3 (three) times daily as needed for mouth pain. Patient not taking: Reported on 10/04/2023 08/13/22   Malvina Ellen, MD  meloxicam  (MOBIC ) 7.5 MG tablet 1  tablet Orally Once a day    [provider]  methimazole  (TAPAZOLE ) 5 MG tablet TAKE ONE-HALF TABLET BY MOUTH  DAILY 11/19/23   Dartha Ernst, MD  rosuvastatin  (CRESTOR ) 10 MG tablet TAKE 1 TABLET BY MOUTH DAILY 07/02/23   Howell Lunger, DO  traMADol  (ULTRAM ) 50 MG  tablet Take 1 tablet (50 mg total) by mouth at bedtime as needed. 10/04/23   Rice, Lonni ORN, MD  traMADol  (ULTRAM ) 50 MG tablet Take 1 tablet (50 mg total) by mouth every 6 (six) hours as needed. 04/23/24   Ula Prentice SAUNDERS, MD  valACYclovir  (VALTREX ) 1000 MG tablet Take 1 tablet (1,000 mg total) by mouth 3 (three) times daily. 04/23/24   Ula Prentice SAUNDERS, MD    ___________________________________________________________________________________________________ Physical Exam:    05/09/2024   10:02 PM 05/09/2024    9:59 PM 05/09/2024    6:31 PM  Vitals with BMI  Systolic 138 138 836  Diastolic 84 84 106  Pulse 88 88 100     1. General:  in No  Acute distress   Chronically ill   -appearing 2. Psychological: Alert and   Oriented 3. Head/ENT:  Dry Mucous Membranes                          Head Non traumatic, neck supple                        Poor Dentition 4. SKIN: normal  Skin turgor,  Skin clean Dry and intact no rash    5. Heart: Regular rate and rhythm no  Murmur, no Rub or gallop 6. Lungs:  no wheezes or crackles   7. Abdomen: Soft,  non-tender, Non distended   obese  bowel sounds present 8. Lower extremities: no clubbing, cyanosis, no  edema 9. Neurologically Grossly intact, moving all 4 extremities equally  10. MSK: Normal range of motion  Back paraspinal tenderness on the left no bony tenderness , no step off Pt is able to ambulate to the bathroom  Chart has been reviewed  ______________________________________________________________________________________________  Assessment/Plan 74 y.o. female with medical history significant of Dm2, HLD, HTN, GERD,   Admitted for lower Gi bleed and back pain     Present on Admission:  Lower GI bleed  Hyperlipidemia associated with type 2 diabetes mellitus (HCC)  Hypertension associated with diabetes (HCC)  GERD  Hypokalemia  Back pain  Hyperthyroidism  Tobacco abuse     Hyperlipidemia associated with type 2 diabetes mellitus (HCC) Continue Crestor  10 mg a day  Hypertension associated with diabetes (HCC) Allow permissive hypertension for tonight  Type 2 diabetes mellitus without complications (HCC) Order sliding scale monitor for hypoglycemia  GERD Protonix  40 mg twice daily  Hypokalemia Will replace and recheck in the morning check magnesium level  Lower GI bleed - Suspect Lower Gi source  No hx of PUD,   - Admit  For further management given:  *Age >60 years,   comorbid illnesses   gross rectal bleeding,  rebleeding    - most likely Diverticular source   - Sent msg to gastroenterology (Dr. Rollin) they will see patient in a.m. appreciate their consult   - serial CBC.    - Monitor for any recurrence,  evidence of hemodynamic instability or significant blood loss -  type and screen,  - Transfuse as needed for hemoglobin below 7 or <9 if evidence of significant  bleeding  - Establish at least 2 PIV and fluid resuscitate   - clear liquids for tonight keep nothing by mouth post midnight,  -  monitor for Recurrent significant   CTA abd/pelvis ordered and pending   Back pain Severe lumbar pain -   [facility] imaging no frx  CT lumbar spine pending  If non-diagnostic may need further imaging with MRI is pain persist  At this time no neurological signs to suggest need for emergent imaging    Hyperthyroidism Check TSH, continue home meds   Other plan as per orders.  DVT prophylaxis:  SCD      Code Status:    Code Status: Prior FULL CODE  as per patient    I had personally discussed CODE STATUS with patient and family  ACP   none     Family Communication:   Family  at  Bedside  plan of care was discussed   with    Daughter,  Diet npo    Disposition Plan:      To home once workup is complete and patient is stable   Following barriers for discharge:                            h/H stable                             Pain controlled with PO medications                                                          Will need consultants to evaluate patient prior to discharge       Consult Orders  (From admission, onward)           Start     Ordered   05/09/24 2206  Consult to hospitalist  Once       Provider:  (Not yet assigned)  Question Answer Comment  Place call to: [facility]   Reason for Consult Admit      05/09/24 2205                               Consults called:  sent msg to Dr. hung   Admission status:  ED Disposition     ED Disposition  Admit   Condition  --   Comment  Hospital Area: [facility] [[id number]]  Level of Care: Progressive [102]  Admit to Progressive based on following criteria: GI, ENDOCRINE disease patients with GI bleeding, acute liver failure or pancreatitis, stable with diabetic ketoacidosis or thyrotoxicosis (hypothyroid) state.  May admit patient to [facility] or [facility] if equivalent level of care is available:: No  Diagnosis: Lower GI bleed [[id number]]  Admitting Physician: Izek Corvino [[id number]]  Attending Physician: Ellamae Lybeck [[id number]]  Certification:: I certify this patient will need inpatient services for at least 2 midnights  Expected Medical Readiness: 05/11/2024            inpatient     I Expect 2 midnight stay secondary to severity of patient's current illness need for inpatient interventions justified by the following:     Severe lab/radiological/exam abnormalities including:   Lower gi bleed and extensive comorbidities including:   DM2  history of stroke with residual deficits    That are currently affecting medical management.   I expect  patient to be hospitalized for 2 midnights requiring  inpatient medical care.  Patient is at high risk for adverse outcome (such as loss of life or  disability) if not treated.  Indication for inpatient stay as follows:    severe pain requiring acute inpatient management,  inability to maintain oral hydration   Need for operative/procedural  intervention    Need for  IV fluids,, IV pain medications,      Level of care      progressive         Jamesyn Lindell 05/10/2024, 1:13 AM    [facility]     after 2 AM please page floor coverage   If 7AM-7PM, please contact the day team taking care of the patient using [email or web address]        [1] No Known Allergies  "

## 2024-05-09 NOTE — Assessment & Plan Note (Signed)
Continue Crestor 10 mg a day.

## 2024-05-09 NOTE — ED Triage Notes (Signed)
 Pt reports seen on 04/23/24 for flank pain that radiates to abd and dx with diverticulitis. Pain 10/10 Pt reports blood in stool today, reports blood in toilet and with wiping.

## 2024-05-09 NOTE — Assessment & Plan Note (Signed)
 Will replace and recheck in the morning check magnesium level

## 2024-05-10 DIAGNOSIS — K922 Gastrointestinal hemorrhage, unspecified: Secondary | ICD-10-CM | POA: Diagnosis not present

## 2024-05-10 DIAGNOSIS — Z720 Tobacco use: Secondary | ICD-10-CM | POA: Diagnosis present

## 2024-05-10 DIAGNOSIS — E0590 Thyrotoxicosis, unspecified without thyrotoxic crisis or storm: Secondary | ICD-10-CM | POA: Diagnosis present

## 2024-05-10 LAB — CBG MONITORING, ED
Glucose-Capillary: 90 mg/dL (ref 70–99)
Glucose-Capillary: 105 mg/dL — ABNORMAL HIGH (ref 70–99)
Glucose-Capillary: 86 mg/dL (ref 70–99)

## 2024-05-10 LAB — URINALYSIS, ROUTINE W REFLEX MICROSCOPIC
Bacteria, UA: NONE SEEN
Bilirubin Urine: NEGATIVE
Glucose, UA: NEGATIVE mg/dL
Ketones, ur: 20 mg/dL — AB
Leukocytes,Ua: NEGATIVE
Nitrite: NEGATIVE
Protein, ur: NEGATIVE mg/dL
Specific Gravity, Urine: 1.033 — ABNORMAL HIGH (ref 1.005–1.030)
pH: 7 (ref 5.0–8.0)

## 2024-05-10 LAB — PHOSPHORUS: Phosphorus: 2.1 mg/dL — ABNORMAL LOW (ref 2.5–4.6)

## 2024-05-10 LAB — HEMOGLOBIN A1C
Hgb A1c MFr Bld: 5.8 % — ABNORMAL HIGH (ref 4.8–5.6)
Mean Plasma Glucose: 119.76 mg/dL

## 2024-05-10 LAB — GLUCOSE, CAPILLARY
Glucose-Capillary: 104 mg/dL — ABNORMAL HIGH (ref 70–99)
Glucose-Capillary: 106 mg/dL — ABNORMAL HIGH (ref 70–99)
Glucose-Capillary: 67 mg/dL — ABNORMAL LOW (ref 70–99)
Glucose-Capillary: 79 mg/dL (ref 70–99)

## 2024-05-10 LAB — MAGNESIUM: Magnesium: 1.9 mg/dL (ref 1.7–2.4)

## 2024-05-10 LAB — CK: Total CK: 63 U/L (ref 38–234)

## 2024-05-10 MED ORDER — LISINOPRIL 10 MG PO TABS
10.0000 mg | ORAL_TABLET | Freq: Every day | ORAL | Status: DC
  Administered 2024-05-10: 10 mg via ORAL
  Filled 2024-05-10: qty 1

## 2024-05-10 MED ORDER — NICOTINE 14 MG/24HR TD PT24
14.0000 mg | MEDICATED_PATCH | Freq: Every day | TRANSDERMAL | Status: DC
  Administered 2024-05-10 – 2024-05-11 (×2): 14 mg via TRANSDERMAL
  Filled 2024-05-10 (×2): qty 1

## 2024-05-10 MED ORDER — TIZANIDINE HCL 4 MG PO TABS
2.0000 mg | ORAL_TABLET | Freq: Every day | ORAL | Status: DC
  Administered 2024-05-10: 2 mg via ORAL
  Filled 2024-05-10: qty 1

## 2024-05-10 MED ORDER — ACETAMINOPHEN 325 MG PO TABS: 650.0000 mg | ORAL_TABLET | Freq: Four times a day (QID) | ORAL | Status: DC | PRN

## 2024-05-10 MED ORDER — HYDROMORPHONE HCL 1 MG/ML IJ SOLN: 0.5000 mg | INTRAMUSCULAR | Status: DC | PRN

## 2024-05-10 MED ORDER — MORPHINE SULFATE (PF) 2 MG/ML IV SOLN
2.0000 mg | INTRAVENOUS | Status: DC | PRN
  Administered 2024-05-10 – 2024-05-11 (×8): 2 mg via INTRAVENOUS
  Filled 2024-05-10 (×8): qty 1

## 2024-05-10 MED ORDER — DICLOFENAC SODIUM 1 % EX GEL
4.0000 g | Freq: Four times a day (QID) | CUTANEOUS | Status: DC
  Administered 2024-05-10 – 2024-05-11 (×4): 4 g via TOPICAL
  Filled 2024-05-10: qty 100

## 2024-05-10 MED ORDER — METHIMAZOLE 2.5 MG HALF TABLET
2.5000 mg | ORAL_TABLET | Freq: Every day | ORAL | Status: DC
  Administered 2024-05-10 – 2024-05-11 (×2): 2.5 mg via ORAL
  Filled 2024-05-10 (×2): qty 1

## 2024-05-10 MED ORDER — FAMOTIDINE 20 MG PO TABS
20.0000 mg | ORAL_TABLET | Freq: Every day | ORAL | Status: DC
  Administered 2024-05-10 – 2024-05-11 (×2): 20 mg via ORAL
  Filled 2024-05-10 (×2): qty 1

## 2024-05-10 MED ORDER — ROSUVASTATIN CALCIUM 10 MG PO TABS
10.0000 mg | ORAL_TABLET | Freq: Every day | ORAL | Status: DC
  Administered 2024-05-10 – 2024-05-11 (×2): 10 mg via ORAL
  Filled 2024-05-10 (×2): qty 1

## 2024-05-10 MED ORDER — METHOCARBAMOL 1000 MG/10ML IJ SOLN
500.0000 mg | Freq: Four times a day (QID) | INTRAMUSCULAR | Status: DC | PRN
  Administered 2024-05-10: 500 mg via INTRAVENOUS
  Filled 2024-05-10: qty 10

## 2024-05-10 MED ORDER — SODIUM CHLORIDE 0.9 % IV SOLN
INTRAVENOUS | Status: AC
  Administered 2024-05-10: 75 mL via INTRAVENOUS

## 2024-05-10 MED ORDER — HYDROCODONE-ACETAMINOPHEN 5-325 MG PO TABS: 1.0000 | ORAL_TABLET | ORAL | Status: DC | PRN

## 2024-05-10 MED ORDER — TRAMADOL HCL 50 MG PO TABS: 50.0000 mg | ORAL_TABLET | Freq: Four times a day (QID) | ORAL | Status: DC | PRN

## 2024-05-10 MED ORDER — HYDROCORTISONE (PERIANAL) 2.5 % EX CREA
1.0000 | TOPICAL_CREAM | Freq: Three times a day (TID) | CUTANEOUS | Status: DC
  Administered 2024-05-10 – 2024-05-11 (×2): 1 via RECTAL
  Filled 2024-05-10: qty 28.35

## 2024-05-10 MED ORDER — ONDANSETRON HCL 4 MG PO TABS: 4.0000 mg | ORAL_TABLET | Freq: Four times a day (QID) | ORAL | Status: DC | PRN

## 2024-05-10 MED ORDER — LIDOCAINE 5 % EX PTCH
1.0000 | MEDICATED_PATCH | CUTANEOUS | Status: DC
  Administered 2024-05-10: 1 via TRANSDERMAL
  Filled 2024-05-10: qty 1

## 2024-05-10 MED ORDER — GABAPENTIN 300 MG PO CAPS
300.0000 mg | ORAL_CAPSULE | Freq: Two times a day (BID) | ORAL | Status: DC
  Administered 2024-05-10 – 2024-05-11 (×3): 300 mg via ORAL
  Filled 2024-05-10 (×3): qty 1

## 2024-05-10 MED ORDER — ONDANSETRON HCL 4 MG/2ML IJ SOLN: 4.0000 mg | Freq: Four times a day (QID) | INTRAMUSCULAR | Status: DC | PRN

## 2024-05-10 MED ORDER — ACETAMINOPHEN 650 MG RE SUPP: 650.0000 mg | Freq: Four times a day (QID) | RECTAL | Status: DC | PRN

## 2024-05-10 NOTE — Progress Notes (Signed)
 [facility]  Charolette Delphia Aran FMW:[id number]  DOB: 1951-04-12  DOA: 05/09/2024  PCP: Patient, No Pcp Per  05/10/2024,7:27 AM  LOS: 1 day    Code Status: Full code     from: Home  73 year old female Previous to breast cancer with lumpectomy previously Known subclinical hyperthyroidism on methimazole  2.5 daily under care of endocrinology Osteoarthritis/polyarthritis followed by rheumatology Dr. 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 Rollin  ED visit 04/23/2024-sharp pain in nature worse with certain movements no fever vomiting diarrhea urinary frequency took some leftover tramadol -felt to have musculoskeletal pain sent home with lidocaine  patch Voltaren  gel Tylenol -looks like was prescribed some ciprofloxacin  as well for this  1/17 returned to emergency room with 1 day episodes since 12 AM on 1/17 of several loose bowel movements that were brown in consistency-they were not black-she subsequently had 1 episode of bright red blood when wiping  She was concerned which is what prompted her coming over-she has not been taking any Anastacia powders-has been taking Tylenol  for back pain--has not eaten anywhere suspicious or had any recent food poisoning in her mind she has had a little bit of nausea-last had diet on 1/16 PM prior to going to bed and having multiple stools  given morphine  bolus fluid and imaging labs and-  Sodium 142 potassium 3.6 BUN/creatinine 11/0.7 LFTs normal lactic acid 1.4 WBC 9.5 hemoglobin 16.0 platelet 236  GI consulted and they will reassess in the morning kept on clear liquids transfusion threshold below 7   Assessment  & Plan :    ?  GI bleed possibly hemorrhoidal Previous colonoscopy as above Not sure if she had a subsequent 1 since then Hemodynamic remained stable hemoglobin has  dropped slightly but I think she might have been hemoconcentrated on admission--- she is not having active acute bleeding so I will stop every 6 hour CBC Is on aspirin 81 at home which has been held and will resume per GI Resume home Pepcid  20 daily Watch for large amount of GI bleed-I think this is mainly hemorrhoidal bleeding based on my exam as below-Will await GI input likely observe on liquid diet and graduate based on their opinions  UTI?  Previous UA   [-] Was on ciprofloxacin  recently and has probably completed 6 days since 1/12 so we will hold any further therapy  Previous breast cancer with lumpectomy Outpatient surveillance as per oncology  HTN Resuming amlodipine  10 will need refills at discharge, continue lisinopril  10  Lumbago low back pain Cautioned to only use Tylenol -have resumed diclofenac  gel, resume Zanaflex  2 at bedtime and resume tramadol  50 every 6 as needed for moderate pain, can continue gabapentin  300 twice daily, continue lidocaine  patch 5% also--here is getting IV Robaxin  500 every 6 which we will stop  Hypothyroidism Continue Tapazole  2.5 daily  Stroke 2003 with left eye blindness Aspirin held at this time   Data Reviewed today: Hemoglobin 14.7 hematocrit 44.1 UA ketones negative leukocytes negative nitrates  DVT prophylaxis: SCD  Dispo/Global plan: Inpatient pending discussion     Subjective:   Awake coherent alert no distress looks well feels fair tells me he was having multiple bouts of diarrhea and then had some blood when she looked at the stool She has had some nausea as well She is taking some pain meds for her back No  burning in the urine  Objective + exam Vitals:   05/10/24 0148 05/10/24 0215 05/10/24 0415 05/10/24 0530  BP:  (!) 152/83 (!) 146/91 (!) 141/99  Pulse:  79 65 76  Resp:  15 16 16   Temp: 97.7 F (36.5 C)   98.2 F (36.8 C)  TempSrc: Oral   Oral  SpO2:  97% 95% 94%   There were no vitals filed for this  visit.   Examination: EOMI NCAT no chest is clear no wheeze lower back she has lidocaine  patches chaperoned exam nursing shows hemorrhoid at 6 PM 1:12 PM but no active plan I deferred a rectal exam no lower extremity edema power is 5/5   Scheduled Meds:  diclofenac  Sodium  4 g Topical QID   famotidine   20 mg Oral Daily   insulin  aspart  0-9 Units Subcutaneous Q4H   lidocaine   1 patch Transdermal Q24H   lisinopril   10 mg Oral QHS   nicotine   14 mg Transdermal Daily   pantoprazole  (PROTONIX ) IV  40 mg Intravenous Q12H   tiZANidine   2 mg Oral QHS   Continuous Infusions: methocarbamol  (ROBAXIN ) injection, morphine  injection  I spent 75 minutes today before, during and after this patient interview and examination--reviewing pertinent data, coordinating the patient's care and in communication with the care team and other medical professionals  Jai-Gurmukh Marguis Mathieson, MD  [facility]

## 2024-05-10 NOTE — Progress Notes (Signed)
 Hypoglycemic Event  CBG: 67  Treatment: 8 oz juice/soda  Symptoms: None  Follow-up CBG: Time:1729 CBG Result:104  Possible Reasons for Event: Inadequate meal intake  Comments/MD notified:hypoglycemic protocol     Wendy Heath

## 2024-05-10 NOTE — Assessment & Plan Note (Signed)
 Check TSH, continue home meds

## 2024-05-11 ENCOUNTER — Encounter (HOSPITAL_COMMUNITY): Payer: Self-pay | Admitting: Internal Medicine

## 2024-05-11 ENCOUNTER — Other Ambulatory Visit (HOSPITAL_COMMUNITY): Payer: Self-pay

## 2024-05-11 DIAGNOSIS — K922 Gastrointestinal hemorrhage, unspecified: Secondary | ICD-10-CM | POA: Diagnosis not present

## 2024-05-11 LAB — COMPREHENSIVE METABOLIC PANEL WITH GFR
ALT: 8 U/L (ref 0–44)
AST: 17 U/L (ref 15–41)
Albumin: 3.6 g/dL (ref 3.5–5.0)
Alkaline Phosphatase: 84 U/L (ref 38–126)
Anion gap: 8 (ref 5–15)
BUN: 10 mg/dL (ref 8–23)
CO2: 24 mmol/L (ref 22–32)
Calcium: 9.7 mg/dL (ref 8.9–10.3)
Chloride: 104 mmol/L (ref 98–111)
Creatinine, Ser: 0.75 mg/dL (ref 0.44–1.00)
GFR, Estimated: >60 mL/min
Glucose, Bld: 93 mg/dL (ref 70–99)
Potassium: 4.1 mmol/L (ref 3.5–5.1)
Sodium: 137 mmol/L (ref 135–145)
Total Bilirubin: 0.5 mg/dL (ref 0.0–1.2)
Total Protein: 6.2 g/dL — ABNORMAL LOW (ref 6.5–8.1)

## 2024-05-11 LAB — CBC WITH DIFFERENTIAL/PLATELET
Abs Immature Granulocytes: 0.02 K/uL (ref 0.00–0.07)
Basophils Absolute: 0 K/uL (ref 0.0–0.1)
Basophils Relative: 1 %
Eosinophils Absolute: 0.2 K/uL (ref 0.0–0.5)
Eosinophils Relative: 3 %
HCT: 39.2 % (ref 36.0–46.0)
Hemoglobin: 12.8 g/dL (ref 12.0–15.0)
Immature Granulocytes: 0 %
Lymphocytes Relative: 36 %
Lymphs Abs: 2.2 K/uL (ref 0.7–4.0)
MCH: 30.5 pg (ref 26.0–34.0)
MCHC: 32.7 g/dL (ref 30.0–36.0)
MCV: 93.6 fL (ref 80.0–100.0)
Monocytes Absolute: 0.6 K/uL (ref 0.1–1.0)
Monocytes Relative: 10 %
Neutro Abs: 3.2 K/uL (ref 1.7–7.7)
Neutrophils Relative %: 50 %
Platelets: 183 K/uL (ref 150–400)
RBC: 4.19 MIL/uL (ref 3.87–5.11)
RDW: 14.8 % (ref 11.5–15.5)
WBC: 6.3 K/uL (ref 4.0–10.5)
nRBC: 0 % (ref 0.0–0.2)

## 2024-05-11 LAB — CBC
HCT: 39.5 % (ref 36.0–46.0)
Hemoglobin: 13 g/dL (ref 12.0–15.0)
MCH: 30.7 pg (ref 26.0–34.0)
MCHC: 32.9 g/dL (ref 30.0–36.0)
MCV: 93.2 fL (ref 80.0–100.0)
Platelets: 179 K/uL (ref 150–400)
RBC: 4.24 MIL/uL (ref 3.87–5.11)
RDW: 14.8 % (ref 11.5–15.5)
WBC: 6.7 K/uL (ref 4.0–10.5)
nRBC: 0 % (ref 0.0–0.2)

## 2024-05-11 LAB — GLUCOSE, CAPILLARY
Glucose-Capillary: 78 mg/dL (ref 70–99)
Glucose-Capillary: 87 mg/dL (ref 70–99)
Glucose-Capillary: 83 mg/dL (ref 70–99)

## 2024-05-11 LAB — PHOSPHORUS: Phosphorus: 2.9 mg/dL (ref 2.5–4.6)

## 2024-05-11 LAB — MAGNESIUM: Magnesium: 2 mg/dL (ref 1.7–2.4)

## 2024-05-11 MED ORDER — PANTOPRAZOLE SODIUM 40 MG PO TBEC
40.0000 mg | DELAYED_RELEASE_TABLET | Freq: Every day | ORAL | 1 refills | Status: AC
  Filled 2024-05-11: qty 30, 30d supply, fill #0

## 2024-05-11 MED ORDER — HYDROCORTISONE (PERIANAL) 2.5 % EX CREA
1.0000 | TOPICAL_CREAM | Freq: Three times a day (TID) | CUTANEOUS | 0 refills | Status: AC
  Filled 2024-05-11: qty 30, 7d supply, fill #0

## 2024-05-11 NOTE — Progress Notes (Signed)
 Patient received discharge medications.

## 2024-05-11 NOTE — Discharge Summary (Signed)
 Physician Discharge Summary  Yovanna Cogan FMW:[id number] DOB: [birth year] DOA: 05/09/2024  PCP: Patient, No Pcp Per  Admit date: 05/09/2024 Discharge date: 05/11/2024  Time spent: 33 minutes  Recommendations for Outpatient Follow-up:  Needs Chem 12 cbc 1 week New meds anusol /protonix  as new Rx Hold ASA for the time being  Discharge Diagnoses:  MAIN problem for hospitalization   Hemorrhoidal bleed  Please see below for itemized issues addressed in HOpsital- refer to other progress notes for clarity if needed  Discharge Condition: heart health  Diet recommendation: regular  There were no vitals filed for this visit.  History of present illness:  74 year old female Previous to breast cancer with lumpectomy previously Known subclinical hyperthyroidism on methimazole  2.5 daily under care of endocrinology Osteoarthritis/polyarthritis followed by rheumatology Dr. 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 Rollin   ED visit 04/23/2024-sharp pain in nature worse with certain movements no fever vomiting diarrhea urinary frequency took some leftover tramadol -felt to have musculoskeletal pain sent home with lidocaine  patch Voltaren  gel Tylenol -looks like was prescribed some ciprofloxacin  as well for this   1/17 returned to emergency room with 1 day episodes since 12 AM on 1/17 of several loose bowel movements that were brown in consistency-they were not black-she subsequently had 1 episode of bright red blood when wiping  She was concerned which is what prompted her coming over-she has not been taking any Anastacia powders-has been taking Tylenol  for back pain--has not eaten anywhere suspicious or had any recent food poisoning in her mind she has had a little bit of nausea-last had diet on 1/16 PM  prior to going to bed and having multiple stools   given morphine  bolus fluid and imaging labs and-   Sodium 142 potassium 3.6 BUN/creatinine 11/0.7 LFTs normal lactic acid 1.4 WBC 9.5 hemoglobin 16.0 platelet 236   GI consulted and they will reassess in the morning kept on clear liquids transfusion threshold below 7    Assessment  & Plan :      ?  GI bleed possibly hemorrhoidal Previous colonoscopy as above No new bleed since admit--hemoglobin quite stable Is on aspirin 81 at home which has been held for now Resume home Pepcid  20 daily, can use Protonix  40 daily Discussed with Dr. Kristie --feels could be c/w hemorrhoidal bleeding--graduate diet and follow  UTI?  Previous UA   [-] Was on ciprofloxacin  recently and has probably completed 6 days since 1/12 so we will hold any further therapy   Previous breast cancer with lumpectomy Outpatient surveillance as per oncology   HTN Resuming amlodipine  10 will need refills at discharge, continue lisinopril  10   Lumbago low back pain Cautioned to only use Tylenol -have resumed diclofenac  gel, resume Zanaflex  2 at bedtime and resume tramadol  50 every 6 as needed for moderate pain, can continue gabapentin  300 twice daily, continue lidocaine  patch 5% also-   Hypothyroidism Continue Tapazole  2.5 daily   Stroke 2003 with left eye blindness Aspirin held at this time  Discharge Exam: Vitals:   05/10/24 2357 05/11/24 0405  BP: 123/73 117/89  Pulse: 60 (!) 58  Resp: 16 20  Temp: 98.1 F (36.7 C) 98.2 F (36.8 C)  SpO2: 97% 97%    Subj on day of d/c   Awake coherent alert in nad no focal deficit  General Exam on  discharge  Eomi nca tno focal def Ctab no wheeze rales S1 s2 no m/r/g/ Abd soft no rebound No le edema  Discharge Instructions   Discharge Instructions     Discharge instructions   Complete by: As directed    You were diagnosed with possibly hemorrhoidal bleeding and should not require further in-hospital workup-I  would stop your aspirin for the time being I have prescribed you some Protonix  40 in case there was any gastric irritation and you can use Anusol  suppositories for bleeding after wiping Please maintain a diet that is easy to digest eat too much fiber for the time being to constipate you and have 1-2 bowel movements daily if possible Follow-up with Dr. Wayna should be calling you to set up an appointment   Increase activity slowly   Complete by: As directed       Allergies as of 05/11/2024   No Known Allergies      Medication List     STOP taking these medications    amoxicillin -clavulanate 875-125 MG tablet Commonly known as: AUGMENTIN    aspirin 81 MG tablet   ciprofloxacin  500 MG tablet Commonly known as: CIPRO    magic mouthwash (lidocaine , diphenhydrAMINE , alum & mag hydroxide) suspension       TAKE these medications    amLODipine  10 MG tablet Commonly known as: NORVASC  Take 1 tablet (10 mg total) by mouth at bedtime. NEEDS APPT FOR FUTURE REFILLS.   benzonatate  200 MG capsule Commonly known as: TESSALON  Take 1 capsule (200 mg total) by mouth 2 (two) times daily as needed for cough.   cetirizine  10 MG tablet Commonly known as: ZYRTEC  Take 1 tablet (10 mg total) by mouth daily.   cholecalciferol 10 MCG (400 UNIT) Tabs tablet Commonly known as: VITAMIN D3 Take 1,000 Units by mouth daily.   diclofenac  Sodium 1 % Gel Commonly known as: VOLTAREN  Apply 4 g topically 4 (four) times daily. Apply to both knees 4 times a day   diclofenac  Sodium 1 % Gel Commonly known as: Voltaren  Apply 4 g topically 4 (four) times daily.   famotidine  20 MG tablet Commonly known as: PEPCID  TAKE 1 TABLET BY MOUTH DAILY   fluticasone  50 MCG/ACT nasal spray Commonly known as: FLONASE  USE 2 SPRAYS IN BOTH  NOSTRILS DAILY What changed:  how much to take how to take this when to take this reasons to take this   gabapentin  300 MG capsule Commonly known as: NEURONTIN  TAKE 1  CAPSULE BY MOUTH TWICE  DAILY What changed:  when to take this reasons to take this   hydrocortisone  2.5 % rectal cream Commonly known as: ANUSOL -HC Place 1 Application rectally 3 (three) times daily.   lidocaine  5 % Commonly known as: Lidoderm  Place 1 patch onto the skin daily. Remove & Discard patch within 12 hours or as directed by MD   lisinopril  10 MG tablet Commonly known as: ZESTRIL  TAKE 1 TABLET BY MOUTH AT  BEDTIME   methimazole  5 MG tablet Commonly known as: TAPAZOLE  TAKE ONE-HALF TABLET BY MOUTH  DAILY   pantoprazole  40 MG tablet Commonly known as: Protonix  Take 1 tablet (40 mg total) by mouth daily.   rosuvastatin  10 MG tablet Commonly known as: CRESTOR  TAKE 1 TABLET BY MOUTH DAILY   tiZANidine  2 MG tablet Commonly known as: ZANAFLEX  Take 2 mg by mouth at bedtime.   traMADol  50 MG tablet Commonly known as: ULTRAM  Take 1 tablet (50 mg total) by mouth every 6 (six) hours as needed. What  changed: Another medication with the same name was removed. Continue taking this medication, and follow the directions you see here.   valACYclovir  1000 MG tablet Commonly known as: VALTREX  Take 1 tablet (1,000 mg total) by mouth 3 (three) times daily.       Allergies[1]    The results of significant diagnostics from this hospitalization (including imaging, microbiology, ancillary and laboratory) are listed below for reference.    Significant Diagnostic Studies: DG Hip Unilat W or Wo Pelvis 2-3 Views Left Result Date: 05/09/2024 EXAM: 2 or 3 VIEW(S) XRAY OF THE BILATERAL HIP 05/09/2024 10:27:56 PM COMPARISON: None available. CLINICAL HISTORY: left lower back pain FINDINGS: BONES AND JOINTS: No acute fracture. LUMBAR SPINE: Moderate degenerative changes of the lower lumbar spine. JOINTS: Mild degenerative changes of the right hip. Mild degenerative changes of the left hip. SOFT TISSUES: Unremarkable. IMPRESSION: 1. No acute findings. Electronically signed by: Greig Pique  MD 05/09/2024 10:35 PM EST RP Workstation: [id number]   DG Lumbar Spine 2-3 Views Result Date: 05/09/2024 EXAM: 2 or 3 VIEW(S) XRAY OF THE LUMBAR SPINE 05/09/2024 10:27:56 PM COMPARISON: None available. CLINICAL HISTORY: left lower back pain FINDINGS: LUMBAR SPINE: BONES: Vertebral body heights are maintained. Alignment is normal. DISCS AND DEGENERATIVE CHANGES: There is moderate disc space narrowing at L5-S1 with facet arthropathy. SOFT TISSUES: There are atherosclerotic calcifications throughout the aorta. No acute abnormality. IMPRESSION: 1. No acute findings. 2. Moderate disc space narrowing at L5-S1 with facet arthropathy. 3. Atherosclerotic calcifications throughout the aorta. Electronically signed by: Greig Pique MD 05/09/2024 10:34 PM EST RP Workstation: [id number]   CT ABDOMEN PELVIS W CONTRAST Result Date: 04/23/2024 EXAM: CT ABDOMEN AND PELVIS WITH CONTRAST 04/23/2024 03:23:48 PM TECHNIQUE: CT of the abdomen and pelvis was performed with the administration of 100 mL of iohexol  (OMNIPAQUE ) 300 MG/ML solution. Multiplanar reformatted images are provided for review. Automated exposure control, iterative reconstruction, and/or weight-based adjustment of the mA/kV was utilized to reduce the radiation dose to as low as reasonably achievable. COMPARISON: None available. CLINICAL HISTORY: left flank, LLQ pain. FINDINGS: LOWER CHEST: No acute abnormality. LIVER: The liver is unremarkable. GALLBLADDER AND BILE DUCTS: Gallbladder is unremarkable. No biliary ductal dilatation. SPLEEN: Splenule noted. The spleen is unremarkable. PANCREAS: No acute abnormality. ADRENAL GLANDS: No acute abnormality. KIDNEYS, URETERS AND BLADDER: No stones in the kidneys or ureters. No hydronephrosis. No perinephric or periureteral stranding. No filling defects of the partially visualized collecting systems on delayed imaging. Urinary bladder is unremarkable. GI AND BOWEL: Tiny hiatal hernia. Stomach demonstrates no acute  abnormality. No small or large bowel thickening or dilatation. The appendix is not definitely identified with no inflammatory changes in the right lower quadrant to suggest acute appendicitis. Colonic diverticulosis. There is no bowel obstruction. PERITONEUM AND RETROPERITONEUM: No ascites. No free air. VASCULATURE: Aorta is normal in caliber. Severe atherosclerotic plaque. LYMPH NODES: No lymphadenopathy. REPRODUCTIVE ORGANS: Status post hysterectomy. No adnexal mass. BONES AND SOFT TISSUES: No acute osseous abnormality. No focal soft tissue abnormality. Intervertebral disc spacece vacuum phenomenon at the L5-S1 level. IMPRESSION: 1. No acute findings in the abdomen or pelvis. 2. Severe atherosclerotic plaque. Electronically signed by: Morgane Naveau MD 04/23/2024 03:36 PM EST RP Workstation: [id number]    Microbiology: No results found for this or any previous visit (from the past 240 hours).   Labs: Basic Metabolic Panel: Recent Labs  Lab 05/09/24 2024 05/10/24 2345  NA 142 137  K 3.3* 4.1  CL 105 104  CO2 23 24  GLUCOSE 115* 93  BUN 11 10  CREATININE 0.76 0.75  CALCIUM  10.3 9.7  MG 1.9 2.0  PHOS 2.1* 2.9   Liver Function Tests: Recent Labs  Lab 05/09/24 2024 05/10/24 2345  AST 22 17  ALT 9 8  ALKPHOS 105 84  BILITOT 0.5 0.5  PROT 7.5 6.2*  ALBUMIN 4.3 3.6   No results for input(s): LIPASE, AMYLASE in the last 168 hours. No results for input(s): AMMONIA in the last 168 hours. CBC: Recent Labs  Lab 05/09/24 1911 05/09/24 2146 05/10/24 2345 05/11/24 0330  WBC 9.5  --  6.7 6.3  NEUTROABS 6.2  --   --  3.2  HGB 16.0* 14.7 13.0 12.8  HCT 49.2* 44.1 39.5 39.2  MCV 93.2  --  93.2 93.6  PLT 236  --  179 183   Cardiac Enzymes: Recent Labs  Lab 05/09/24 2024  CKTOTAL 63   BNP: BNP (last 3 results) No results for input(s): BNP in the last 8760 hours.  ProBNP (last 3 results) No results for input(s): PROBNP in the last 8760 hours.  CBG: Recent Labs   Lab 05/10/24 1729 05/10/24 1938 05/10/24 2353 05/11/24 0402 05/11/24 0752  GLUCAP 104* 79 106* 78 87    Signed:  Colen Grimes MD   [facility] 05/11/2024, 11:11 AM      [1] No Known Allergies

## 2024-05-11 NOTE — Progress Notes (Signed)
 Patient received discharge orders to go home. Patient was given discharge paperwork/instructions. RN went over discharge instructions/paperwork with the patient. Any questions/concerns were addressed/answered to the best of RN's ability during this time. Patient stable, has discharge paperwork/instructions, and has all personal belongings. Patient waiting on discharge medications at this time prior to leaving the hospital.

## 2024-05-11 NOTE — Progress Notes (Signed)
 Discharge meds in a secure bag delivered to patient by this RN

## 2024-05-11 NOTE — Progress Notes (Signed)
 Mobility Specialist - Progress Note   05/11/24 1043  Mobility  Activity Ambulated with assistance  Level of Assistance Contact guard assist, steadying assist  Assistive Device None  Distance Ambulated (ft) 250 ft  Range of Motion/Exercises Active  Activity Response Tolerated well  Mobility visit 1 Mobility  Mobility Specialist Start Time (ACUTE ONLY) 1030  Mobility Specialist Stop Time (ACUTE ONLY) 1043  Mobility Specialist Time Calculation (min) (ACUTE ONLY) 13 min   Pt was found in bed and agreeable to mobilize. C/o lightheadedness when going from sit>stand. Had x1 LOB stating due to arthritis on L knee. At EOS returned to bed with all needs met. Call bell in reach.   Erminio Leos,  Mobility Specialist Can be reached via Secure Chat
